# Patient Record
Sex: Female | Born: 1950 | Race: White | Hispanic: No | Marital: Single | Smoking: Never smoker
Health system: Southern US, Community
[De-identification: ages and names within clinical notes are randomized; demographics above are authoritative.]

## PROBLEM LIST (undated history)

## (undated) DIAGNOSIS — F32A Depression, unspecified: Secondary | ICD-10-CM

## (undated) DIAGNOSIS — G600 Hereditary motor and sensory neuropathy: Secondary | ICD-10-CM

## (undated) DIAGNOSIS — J302 Other seasonal allergic rhinitis: Secondary | ICD-10-CM

## (undated) DIAGNOSIS — E7800 Pure hypercholesterolemia, unspecified: Secondary | ICD-10-CM

## (undated) DIAGNOSIS — Z9889 Other specified postprocedural states: Secondary | ICD-10-CM

## (undated) DIAGNOSIS — F419 Anxiety disorder, unspecified: Secondary | ICD-10-CM

## (undated) DIAGNOSIS — M3500 Sicca syndrome, unspecified: Secondary | ICD-10-CM

## (undated) DIAGNOSIS — C801 Malignant (primary) neoplasm, unspecified: Secondary | ICD-10-CM

## (undated) DIAGNOSIS — Z9109 Other allergy status, other than to drugs and biological substances: Secondary | ICD-10-CM

## (undated) DIAGNOSIS — I10 Essential (primary) hypertension: Secondary | ICD-10-CM

## (undated) DIAGNOSIS — M329 Systemic lupus erythematosus, unspecified: Secondary | ICD-10-CM

## (undated) DIAGNOSIS — K222 Esophageal obstruction: Secondary | ICD-10-CM

## (undated) DIAGNOSIS — M1990 Unspecified osteoarthritis, unspecified site: Secondary | ICD-10-CM

## (undated) DIAGNOSIS — J45909 Unspecified asthma, uncomplicated: Secondary | ICD-10-CM

## (undated) DIAGNOSIS — R0600 Dyspnea, unspecified: Secondary | ICD-10-CM

## (undated) DIAGNOSIS — K219 Gastro-esophageal reflux disease without esophagitis: Secondary | ICD-10-CM

## (undated) DIAGNOSIS — IMO0002 Reserved for concepts with insufficient information to code with codable children: Secondary | ICD-10-CM

## (undated) DIAGNOSIS — R112 Nausea with vomiting, unspecified: Secondary | ICD-10-CM

## (undated) DIAGNOSIS — F329 Major depressive disorder, single episode, unspecified: Secondary | ICD-10-CM

## (undated) HISTORY — PX: CHOLECYSTECTOMY: SHX55

## (undated) HISTORY — PX: OTHER SURGICAL HISTORY: SHX169

## (undated) HISTORY — DX: Esophageal obstruction: K22.2

## (undated) HISTORY — DX: Other allergy status, other than to drugs and biological substances: Z91.09

## (undated) HISTORY — DX: Essential (primary) hypertension: I10

## (undated) HISTORY — PX: LUMBAR EPIDURAL INJECTION: SHX1980

## (undated) HISTORY — DX: Unspecified asthma, uncomplicated: J45.909

## (undated) HISTORY — PX: PARTIAL HYSTERECTOMY: SHX80

## (undated) HISTORY — DX: Gastro-esophageal reflux disease without esophagitis: K21.9

---

## 2017-05-26 ENCOUNTER — Encounter (INDEPENDENT_AMBULATORY_CARE_PROVIDER_SITE_OTHER): Payer: Self-pay | Admitting: Internal Medicine

## 2017-05-28 ENCOUNTER — Encounter (INDEPENDENT_AMBULATORY_CARE_PROVIDER_SITE_OTHER): Payer: Self-pay | Admitting: *Deleted

## 2017-05-28 ENCOUNTER — Encounter (INDEPENDENT_AMBULATORY_CARE_PROVIDER_SITE_OTHER): Payer: Self-pay

## 2017-05-28 ENCOUNTER — Encounter (INDEPENDENT_AMBULATORY_CARE_PROVIDER_SITE_OTHER): Payer: Self-pay | Admitting: Internal Medicine

## 2017-05-28 ENCOUNTER — Ambulatory Visit (INDEPENDENT_AMBULATORY_CARE_PROVIDER_SITE_OTHER): Payer: Medicare Other | Admitting: Internal Medicine

## 2017-05-28 ENCOUNTER — Other Ambulatory Visit (INDEPENDENT_AMBULATORY_CARE_PROVIDER_SITE_OTHER): Payer: Self-pay | Admitting: Internal Medicine

## 2017-05-28 DIAGNOSIS — R1310 Dysphagia, unspecified: Secondary | ICD-10-CM

## 2017-05-28 DIAGNOSIS — Z9109 Other allergy status, other than to drugs and biological substances: Secondary | ICD-10-CM | POA: Insufficient documentation

## 2017-05-28 DIAGNOSIS — K222 Esophageal obstruction: Secondary | ICD-10-CM | POA: Diagnosis not present

## 2017-05-28 DIAGNOSIS — R1319 Other dysphagia: Secondary | ICD-10-CM | POA: Insufficient documentation

## 2017-05-28 DIAGNOSIS — J45909 Unspecified asthma, uncomplicated: Secondary | ICD-10-CM

## 2017-05-28 DIAGNOSIS — K219 Gastro-esophageal reflux disease without esophagitis: Secondary | ICD-10-CM

## 2017-05-28 DIAGNOSIS — G600 Hereditary motor and sensory neuropathy: Secondary | ICD-10-CM | POA: Insufficient documentation

## 2017-05-28 DIAGNOSIS — I10 Essential (primary) hypertension: Secondary | ICD-10-CM

## 2017-05-28 HISTORY — DX: Gastro-esophageal reflux disease without esophagitis: K21.9

## 2017-05-28 HISTORY — DX: Essential (primary) hypertension: I10

## 2017-05-28 HISTORY — DX: Esophageal obstruction: K22.2

## 2017-05-28 HISTORY — DX: Unspecified asthma, uncomplicated: J45.909

## 2017-05-28 HISTORY — DX: Other allergy status, other than to drugs and biological substances: Z91.09

## 2017-05-28 NOTE — Progress Notes (Signed)
   Subjective:    Patient ID: Marie Calhoun, female    DOB: 1951-08-08, 66 y.o.   MRN: [id number]  HPI Referred by Ephriam Jenkins, NP for dysphagia. From records, l EGD in 2011 by Dr. Algis Greenhouse. She however shows me pictures of EGD/ED in 2014. Dysphagia since end of April. In May, foods would lodge and she would have to vomit bolus.  Appetite is okay. Weight loss of 20 pounds which was intentional  No abdominal pain. Has a A BM every other day.  No melena or BRRB. Last colonoscopy  2011 per patient.    04/03/2010 [id number] there is a distal esophageal stricture which has normal mucosa but biopsies are done.  Proximal stomach is normal.  IN the antrum there are multiple small gastric ulcers so biopsies are done for. H pylori.  Pyloric channel and duodenum, are normal. 54 Bougie dilator 5 times to fully dilate the stricture.  Review of Systems Past Medical History:  Diagnosis Date  . Asthma 05/28/2017  . Environmental allergies 05/28/2017  . Esophageal stricture 05/28/2017  . Essential hypertension, benign 05/28/2017  . GERD (gastroesophageal reflux disease) 05/28/2017    No past surgical history on file.  Allergies  Allergen Reactions  . Demerol [Meperidine]     nausea  . Sulfa Antibiotics      swell    No current outpatient prescriptions on file prior to visit.   No current facility-administered medications on file prior to visit.         Objective:   Physical Exam Blood pressure 140/70, pulse 76, temperature 98.6 F (37 C), height 5\' 8"  (1.727 m), weight 166 lb 3.2 oz (75.4 kg). Alert and oriented. Skin warm and dry. Oral mucosa is moist.   . Sclera anicteric, conjunctivae is pink. Thyroid not enlarged. No cervical lymphadenopathy. Lungs clear. Heart regular rate and rhythm.  Abdomen is soft. Bowel sounds are positive. No hepatomegaly. No abdominal masses felt. No tenderness.  No edema to lower extremities.  ( Both lower legs are in braces)        Assessment & Plan:    Dysphagia: esophageal stricture needs to be ruled out.  EGD/ED. The risks of bleeding, perforation and infection were reviewed with patient.

## 2017-05-28 NOTE — Patient Instructions (Signed)
EGD/ED. The risks of bleeding, perforation and infection were reviewed with patient.  

## 2017-05-30 ENCOUNTER — Encounter (INDEPENDENT_AMBULATORY_CARE_PROVIDER_SITE_OTHER): Payer: Self-pay

## 2017-08-15 ENCOUNTER — Encounter (INDEPENDENT_AMBULATORY_CARE_PROVIDER_SITE_OTHER): Payer: Self-pay | Admitting: Internal Medicine

## 2017-09-11 ENCOUNTER — Encounter (HOSPITAL_COMMUNITY): Payer: Self-pay | Admitting: *Deleted

## 2017-09-11 ENCOUNTER — Encounter (HOSPITAL_COMMUNITY)
Admission: RE | Disposition: A | Discharge status: Home / Self Care | Payer: Self-pay | Source: Ambulatory Visit | Attending: Internal Medicine

## 2017-09-11 ENCOUNTER — Ambulatory Visit (HOSPITAL_COMMUNITY)
Admission: RE | Admit: 2017-09-11 | Discharge: 2017-09-11 | Disposition: A | Discharge status: Home / Self Care | Payer: Medicare Other | Source: Ambulatory Visit | Attending: Internal Medicine | Admitting: Internal Medicine

## 2017-09-11 DIAGNOSIS — K449 Diaphragmatic hernia without obstruction or gangrene: Secondary | ICD-10-CM | POA: Diagnosis not present

## 2017-09-11 DIAGNOSIS — R1319 Other dysphagia: Secondary | ICD-10-CM

## 2017-09-11 DIAGNOSIS — R1314 Dysphagia, pharyngoesophageal phase: Secondary | ICD-10-CM | POA: Diagnosis not present

## 2017-09-11 DIAGNOSIS — E7800 Pure hypercholesterolemia, unspecified: Secondary | ICD-10-CM | POA: Diagnosis not present

## 2017-09-11 DIAGNOSIS — F329 Major depressive disorder, single episode, unspecified: Secondary | ICD-10-CM | POA: Diagnosis not present

## 2017-09-11 DIAGNOSIS — K219 Gastro-esophageal reflux disease without esophagitis: Secondary | ICD-10-CM | POA: Diagnosis not present

## 2017-09-11 DIAGNOSIS — F419 Anxiety disorder, unspecified: Secondary | ICD-10-CM | POA: Diagnosis not present

## 2017-09-11 DIAGNOSIS — Z79899 Other long term (current) drug therapy: Secondary | ICD-10-CM | POA: Insufficient documentation

## 2017-09-11 DIAGNOSIS — J45909 Unspecified asthma, uncomplicated: Secondary | ICD-10-CM | POA: Insufficient documentation

## 2017-09-11 DIAGNOSIS — K222 Esophageal obstruction: Secondary | ICD-10-CM | POA: Insufficient documentation

## 2017-09-11 DIAGNOSIS — I10 Essential (primary) hypertension: Secondary | ICD-10-CM | POA: Insufficient documentation

## 2017-09-11 DIAGNOSIS — R1310 Dysphagia, unspecified: Secondary | ICD-10-CM | POA: Insufficient documentation

## 2017-09-11 HISTORY — DX: Pure hypercholesterolemia, unspecified: E78.00

## 2017-09-11 HISTORY — DX: Other seasonal allergic rhinitis: J30.2

## 2017-09-11 HISTORY — DX: Depression, unspecified: F32.A

## 2017-09-11 HISTORY — DX: Major depressive disorder, single episode, unspecified: F32.9

## 2017-09-11 HISTORY — PX: ESOPHAGEAL DILATION: SHX303

## 2017-09-11 HISTORY — PX: ESOPHAGOGASTRODUODENOSCOPY: SHX5428

## 2017-09-11 HISTORY — DX: Anxiety disorder, unspecified: F41.9

## 2017-09-11 SURGERY — EGD (ESOPHAGOGASTRODUODENOSCOPY)
Anesthesia: Moderate Sedation

## 2017-09-11 MED ORDER — MEPERIDINE HCL 50 MG/ML IJ SOLN
INTRAMUSCULAR | Status: AC
  Filled 2017-09-11: qty 1

## 2017-09-11 MED ORDER — MIDAZOLAM HCL 5 MG/5ML IJ SOLN
INTRAMUSCULAR | Status: AC
  Filled 2017-09-11: qty 10

## 2017-09-11 MED ORDER — FENTANYL CITRATE (PF) 100 MCG/2ML IJ SOLN
INTRAMUSCULAR | Status: DC | PRN
  Administered 2017-09-11 (×2): 25 ug via INTRAVENOUS

## 2017-09-11 MED ORDER — SODIUM CHLORIDE 0.9 % IV SOLN
INTRAVENOUS | Status: DC
Start: 2017-09-11 — End: 2017-09-11
  Administered 2017-09-11: 13:00:00 via INTRAVENOUS

## 2017-09-11 MED ORDER — FENTANYL CITRATE (PF) 100 MCG/2ML IJ SOLN
INTRAMUSCULAR | Status: AC
  Filled 2017-09-11: qty 2

## 2017-09-11 MED ORDER — LIDOCAINE VISCOUS 2 % MT SOLN
OROMUCOSAL | Status: AC
  Filled 2017-09-11: qty 15

## 2017-09-11 MED ORDER — LIDOCAINE VISCOUS 2 % MT SOLN
OROMUCOSAL | Status: DC | PRN
Start: 2017-09-11 — End: 2017-09-11
  Administered 2017-09-11: 1 via OROMUCOSAL

## 2017-09-11 MED ORDER — MIDAZOLAM HCL 5 MG/5ML IJ SOLN
INTRAMUSCULAR | Status: DC | PRN
Start: 2017-09-11 — End: 2017-09-11
  Administered 2017-09-11 (×5): 2 mg via INTRAVENOUS

## 2017-09-11 NOTE — H&P (Signed)
Marie Calhoun is an 66 y.o. female.   Chief Complaint: Patient is here for EGD and ED. HPI: Patient is 66 year old Caucasian female was chronic GERD complicated by distal esophageal stricture which has been dilated times. She had dilation in May 2011 from July 2014 with Dr. Algis Greenhouse. She states she has done well until about 4 months ago. She says heartburn is well controlled with pantoprazole. She was switched to ranitidine but it did not help. She is not having any problems with pantoprazole. She denies nausea vomiting or melena.  Past Medical History:  Diagnosis Date  . Anxiety   . Asthma 05/28/2017  . Depression     05/28/2017  . Esophageal stricture 05/28/2017  . Essential hypertension, benign 05/28/2017  . GERD (gastroesophageal reflux disease) 05/28/2017  . Hypercholesteremia   . Seasonal allergies     Past Surgical History:  Procedure Laterality Date  . breast tumor\     left benign  . CHOLECYSTECTOMY    . knee surgeries     1980  . PARTIAL HYSTERECTOMY     1995    History reviewed. No pertinent family history. Social History:  reports that she has never smoked. She has never used smokeless tobacco. She reports that she does not drink alcohol or use drugs.  Allergies:  Allergies  Allergen Reactions  . Demerol [Meperidine] Nausea Only    nausea  . Sulfa Antibiotics Swelling    Medications Prior to Admission  Medication Sig Dispense Refill  . acetaminophen (TYLENOL) 500 MG tablet Take 1,000 mg by mouth daily.     . bisacodyl (DULCOLAX) 5 MG EC tablet Take 5 mg by mouth at bedtime.    . Calcium Carb-Cholecalciferol (CALCIUM 600 + D PO) Take 1 tablet by mouth 2 (two) times daily.    Marland Kitchen docusate sodium (COLACE) 100 MG capsule Take 100 mg by mouth at bedtime.     Marland Kitchen EPINEPHrine 0.3 mg/0.3 mL IJ SOAJ injection Inject into the muscle once.    . gabapentin (NEURONTIN) 600 MG tablet Take 600-900 mg by mouth 3 (three) times daily. Takes 1 (600 mg) tablet in the morning, 1.5 ( 900  mg)tablets at lunch and 1.5 (900 mg) tablet at bedtime    . losartan (COZAAR) 50 MG tablet Take 50 mg by mouth daily.    . montelukast (SINGULAIR) 10 MG tablet Take 10 mg by mouth at bedtime.    . Multiple Vitamin (MULTIVITAMIN WITH MINERALS) TABS tablet Take 1 tablet by mouth daily.    . pantoprazole (PROTONIX) 40 MG tablet Take 40 mg by mouth daily with lunch.    . sertraline (ZOLOFT) 50 MG tablet Take 50 mg by mouth daily.    . solifenacin (VESICARE) 5 MG tablet Take 5 mg by mouth at bedtime.     Marland Kitchen albuterol (PROVENTIL HFA;VENTOLIN HFA) 108 (90 Base) MCG/ACT inhaler Inhale 2 puffs into the lungs every 6 (six) hours as needed for wheezing or shortness of breath.     . fluocinonide cream (LIDEX) 3.41 % Apply 1 application topically 2 (two) times daily as needed (itching/rash).     Marland Kitchen guaiFENesin (MUCINEX) 600 MG 12 hr tablet Take 600 mg by mouth daily as needed for cough.     . pravastatin (PRAVACHOL) 10 MG tablet Take 10 mg by mouth daily.      No results found for this or any previous visit (from the past 48 hour(s)). No results found.  ROS  Blood pressure 131/83, pulse 75, temperature 98.7 F (37.1  C), temperature source Oral, resp. rate 20, height 5\' 8"  (1.727 m), weight 172 lb (78 kg), SpO2 96 %. Physical Exam  Constitutional: She appears well-developed and well-nourished.  HENT:  Mouth/Throat: Oropharynx is clear and moist.  Eyes: Conjunctivae are normal. No scleral icterus.  Neck: No thyromegaly present.  Cardiovascular: Normal rate, regular rhythm and normal heart sounds.   No murmur heard. Respiratory: Effort normal and breath sounds normal.  GI: Soft. Bowel sounds are normal.  Musculoskeletal: She exhibits no edema.  Lymphadenopathy:    She has no cervical adenopathy.  Neurological: She is alert.  Skin: Skin is warm and dry.     Assessment/Plan Solid food dysphagia in patient with history of esophageal stricture secondary to GERD. EGD with ED.  Hildred Laser,  MD 09/11/2017, 2:10 PM

## 2017-09-11 NOTE — Op Note (Signed)
[facility] Patient Name: Marie Calhoun Procedure Date: 09/11/2017 1:47 PM MRN: [id number] Date of Birth: [birth year] Attending MD: Hildred Laser , MD CSN: [id number] Age: 66 Admit Type: Outpatient Procedure:                Upper GI endoscopy Indications:              Esophageal dysphagia, Gastro-esophageal reflux                            disease Providers:                Hildred Laser, MD, Lurline Del, RN, Randa Spike,                            Technician Referring MD:             Earney Mallet, MD Medicines:                Lidocaine spray, Fentanyl 50 micrograms IV,                            Midazolam 10 mg IV Complications:            No immediate complications. Estimated Blood Loss:     Estimated blood loss was minimal. Procedure:                Pre-Anesthesia Assessment:                           - Prior to the procedure, a History and Physical                            was performed, and patient medications and                            allergies were reviewed. The patient's tolerance of                            previous anesthesia was also reviewed. The risks                            and benefits of the procedure and the sedation                            options and risks were discussed with the patient.                            All questions were answered, and informed consent                            was obtained. Prior Anticoagulants: The patient has                            taken no previous anticoagulant or antiplatelet                            agents. ASA Grade  Assessment: II - A patient with                            mild systemic disease. After reviewing the risks                            and benefits, the patient was deemed in                            satisfactory condition to undergo the procedure.                           After obtaining informed consent, the endoscope was                            passed under direct vision.  Throughout the                            procedure, the patient's blood pressure, pulse, and                            oxygen saturations were monitored continuously. The                            [id number]) scope was introduced through the                            mouth, and advanced to the second part of duodenum.                            The upper GI endoscopy was accomplished without                            difficulty. The patient tolerated the procedure                            well. Scope In: 2:23:12 PM Scope Out: 2:35:17 PM Total Procedure Duration: 0 hours 12 minutes 5 seconds  Findings:      The examined esophagus was normal.      A moderate Schatzki ring (acquired) was found at the gastroesophageal       junction located at 35 cm from incisors. A TTS dilator was passed       through the scope. Dilation with a 15-16.5-18 mm balloon dilator was       performed to 15 mm, 16.5 mm and 18 mm. The dilation site was examined       and showed mild improvement in luminal narrowing and no bleeding,       mucosal tear or perforation. This was biopsied with a cold forceps for       to disrupt the ring. Note tissue say for biopsy.      A 3 cm hiatal hernia was present.      The entire examined stomach was normal.      The duodenal bulb and second portion of the duodenum were normal. Impression:               -  Normal esophagus.                           - Moderate Schatzki ring. Dilated. Biopsied.                           - 3 cm hiatal hernia.                           - Normal stomach.                           - Normal duodenal bulb and second portion of the                            duodenum. Moderate Sedation:      Moderate (conscious) sedation was administered by the endoscopy nurse       and supervised by the endoscopist. The following parameters were       monitored: oxygen saturation, heart rate, blood pressure, CO2       capnography and response to care.  Total physician intraservice time was       21 minutes. Recommendation:           - Patient has a contact number available for                            emergencies. The signs and symptoms of potential                            delayed complications were discussed with the                            patient. Return to normal activities tomorrow.                            Written discharge instructions were provided to the                            patient.                           - Resume previous diet today.                           - Continue present medications.                           - No aspirin, ibuprofen, naproxen, or other                            non-steroidal anti-inflammatory drugs for 2 days.                           - Repeat upper endoscopy PRN.                           - Telephone GI clinic in 1 week. Procedure  Code(s):        --- Professional ---                           [phone], Esophagogastroduodenoscopy, flexible,                            transoral; with transendoscopic balloon dilation of                            esophagus (less than 30 mm diameter)                           43239, Esophagogastroduodenoscopy, flexible,                            transoral; with biopsy, single or multiple                           99152, Moderate sedation services provided by the                            same physician or other qualified health care                            professional performing the diagnostic or                            therapeutic service that the sedation supports,                            requiring the presence of an independent trained                            observer to assist in the monitoring of the                            patient's level of consciousness and physiological                            status; initial 15 minutes of intraservice time,                            patient age 54 years or older Diagnosis Code(s):         --- Professional ---                           K22.2, Esophageal obstruction                           K44.9, Diaphragmatic hernia without obstruction or                            gangrene                           R13.14, Dysphagia, pharyngoesophageal  phase                           K21.9, Gastro-esophageal reflux disease without                            esophagitis CPT copyright 2016 American Medical Association. All rights reserved. The codes documented in this report are preliminary and upon coder review may  be revised to meet current compliance requirements. Hildred Laser, MD Hildred Laser, MD 09/11/2017 2:48:19 PM This report has been signed electronically. Number of Addenda: 0

## 2017-09-11 NOTE — Discharge Instructions (Signed)
Resume usual medications and diet.. No driving for 24 hours. Please call office with progress report in one week.   Upper Endoscopy, Care After Refer to this sheet in the next few weeks. These instructions provide you with information about caring for yourself after your procedure. Your health care provider may also give you more specific instructions. Your treatment has been planned according to current medical practices, but problems sometimes occur. Call your health care provider if you have any problems or questions after your procedure. What can I expect after the procedure? After the procedure, it is common to have:  A sore throat.  Bloating.  Nausea.  Follow these instructions at home:  Follow instructions from your health care provider about what to eat or drink after your procedure.  Return to your normal activities as told by your health care provider. Ask your health care provider what activities are safe for you.  Take over-the-counter and prescription medicines only as told by your health care provider.  Do not drive for 24 hours if you received a sedative.  Keep all follow-up visits as told by your health care provider. This is important. Contact a health care provider if:  You have a sore throat that lasts longer than one day.  You have trouble swallowing. Get help right away if:  You have a fever.  You vomit blood or your vomit looks like coffee grounds.  You have bloody, black, or tarry stools.  You have a severe sore throat or you cannot swallow.  You have difficulty breathing.  You have severe pain in your chest or belly. This information is not intended to replace advice given to you by your health care provider. Make sure you discuss any questions you have with your health care provider. Document Released: 05/05/2012 Document Revised: 04/11/2016 Document Reviewed: 08/17/2015 Elsevier Interactive Patient Education  2017 [employer].

## 2017-09-15 ENCOUNTER — Encounter (HOSPITAL_COMMUNITY): Payer: Self-pay | Admitting: Internal Medicine

## 2017-09-22 ENCOUNTER — Telehealth (INDEPENDENT_AMBULATORY_CARE_PROVIDER_SITE_OTHER): Payer: Self-pay | Admitting: Internal Medicine

## 2017-09-22 NOTE — Telephone Encounter (Signed)
I will verbally give Dr.Rehman this progress report.

## 2017-09-22 NOTE — Telephone Encounter (Signed)
Patient called with a progress report and she is doing good.  [phone]

## 2018-06-15 ENCOUNTER — Encounter (INDEPENDENT_AMBULATORY_CARE_PROVIDER_SITE_OTHER): Payer: Self-pay | Admitting: *Deleted

## 2018-06-15 ENCOUNTER — Ambulatory Visit (INDEPENDENT_AMBULATORY_CARE_PROVIDER_SITE_OTHER): Payer: Medicare Other | Admitting: Internal Medicine

## 2018-06-15 ENCOUNTER — Encounter (INDEPENDENT_AMBULATORY_CARE_PROVIDER_SITE_OTHER): Payer: Self-pay | Admitting: Internal Medicine

## 2018-06-15 VITALS — BP 152/90 | HR 96 | Temp 98.5°F | Ht 68.0 in | Wt 141.6 lb

## 2018-06-15 DIAGNOSIS — R1310 Dysphagia, unspecified: Secondary | ICD-10-CM

## 2018-06-15 DIAGNOSIS — R1319 Other dysphagia: Secondary | ICD-10-CM

## 2018-06-15 NOTE — Patient Instructions (Signed)
The risks of bleeding, perforation and infection were reviewed with patient.  

## 2018-06-15 NOTE — Progress Notes (Signed)
Subjective:    Patient ID: Marie Calhoun, female    DOB: 10-19-1951, 67 y.o.   MRN: [id number]  HPI Here today for f/u.    EGD 05/28/2017 for dysphagia. In October of 2018 she underwent an EGD/ED:  Normal esphagus. Moderate Schatzki ring dilated. 3cm hiatal hernia. Normal stomach. Has undergone several EGD/ED in the past.  Protonix stopped by PCP Dr. Macarthur Critchley.  She has been using Tum and MOM for her GERD. She said it caused her Lupus.  Off Protoinx since February. She developed a rash. 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  She tells me today she is having swallowing. She says she has to be careful when she drinks also. Symptoms since May. Solid foods bother her. A couple of weeks ago, she choked at [employer].  She has had weight loss due to the dysphagia. She has been eating in small portions.  Wt last year in June was 166. Today her weight is 141.9   Review of Systems Past Medical History:  Diagnosis Date  . Anxiety   . Asthma 05/28/2017  . Depression   . Environmental allergies 05/28/2017  . Esophageal stricture 05/28/2017  . Essential hypertension, benign 05/28/2017  . GERD (gastroesophageal reflux disease) 05/28/2017  . Hypercholesteremia   . Seasonal allergies     Past Surgical History:  Procedure Laterality Date  . breast tumor\     left benign  . CHOLECYSTECTOMY    . ESOPHAGEAL DILATION N/A 09/11/2017   Procedure: ESOPHAGEAL DILATION;  Surgeon: Rogene Houston, MD;  Location: [facility];  Service: Endoscopy;  Laterality: N/A;  . ESOPHAGOGASTRODUODENOSCOPY N/A 09/11/2017   Procedure: ESOPHAGOGASTRODUODENOSCOPY (EGD);  Surgeon: Rogene Houston, MD;  Location: [facility];  Service: Endoscopy;  Laterality: N/A;  1:40 - Can NOT come earlier  . knee surgeries     1980  . PARTIAL HYSTERECTOMY     1995    Allergies  Allergen Reactions  . Demerol [Meperidine] Nausea Only    nausea  . Sulfa Antibiotics  Swelling    Current Outpatient Medications on File Prior to Visit  Medication Sig Dispense Refill  . amLODipine (NORVASC) 5 MG tablet Take 5 mg by mouth daily.    Marland Kitchen anti-nausea (EMETROL) solution Take 10 mLs by mouth as needed for nausea or vomiting.    . folic acid (FOLVITE) 1 MG tablet Take 1 mg by mouth daily.    . hydrOXYzine (ATARAX/VISTARIL) 25 MG tablet Take 25 mg by mouth 3 (three) times daily as needed.    . loratadine (CLARITIN) 10 MG tablet Take 10 mg by mouth daily.    . methotrexate 2.5 MG tablet Take 2.5 mg by mouth. 6 tabs every Wednesday    . nortriptyline (PAMELOR) 25 MG capsule Take 25 mg by mouth at bedtime.    . triamcinolone ointment (KENALOG) 0.1 % Apply 1 application topically 2 (two) times daily.    Marland Kitchen acetaminophen (TYLENOL) 500 MG tablet Take 1,000 mg by mouth daily.     Marland Kitchen albuterol (PROVENTIL HFA;VENTOLIN HFA) 108 (90 Base) MCG/ACT inhaler Inhale 2 puffs into the lungs every 6 (six) hours as needed for wheezing or shortness of breath.     . bisacodyl (DULCOLAX) 5 MG EC tablet Take 5 mg by mouth at bedtime.    . Calcium Carb-Cholecalciferol (CALCIUM 600 + D PO) Take 1 tablet by mouth 2 (two) times daily.    Marland Kitchen docusate sodium (  COLACE) 100 MG capsule Take 100 mg by mouth at bedtime.     Marland Kitchen EPINEPHrine 0.3 mg/0.3 mL IJ SOAJ injection Inject into the muscle once.    . fluocinonide cream (LIDEX) 8.63 % Apply 1 application topically 2 (two) times daily as needed (itching/rash).     . gabapentin (NEURONTIN) 600 MG tablet Take 600-900 mg by mouth 3 (three) times daily. Takes 1 (600 mg) tablet in the morning, 1.5 ( 900 mg)tablets at lunch and 1.5 (900 mg) tablet at bedtime    . guaiFENesin (MUCINEX) 600 MG 12 hr tablet Take 600 mg by mouth daily as needed for cough.     . losartan (COZAAR) 50 MG tablet Take 50 mg by mouth daily.    . montelukast (SINGULAIR) 10 MG tablet Take 10 mg by mouth at bedtime.    . Multiple Vitamin (MULTIVITAMIN WITH MINERALS) TABS tablet Take 1 tablet  by mouth daily.    . pantoprazole (PROTONIX) 40 MG tablet Take 40 mg by mouth daily with lunch.    . pravastatin (PRAVACHOL) 10 MG tablet Take 10 mg by mouth daily.    . sertraline (ZOLOFT) 50 MG tablet Take 50 mg by mouth daily.    . solifenacin (VESICARE) 5 MG tablet Take 5 mg by mouth at bedtime.      No current facility-administered medications on file prior to visit.         Objective:   Physical Exam Blood pressure (!) 152/90, pulse 96, temperature 98.5 F (36.9 C), height 5\' 8"  (1.727 m), weight 141 lb 9.6 oz (64.2 kg). Alert and oriented. Skin warm and dry. Oral mucosa is moist.   . Sclera anicteric, conjunctivae is pink. Thyroid not enlarged. No cervical lymphadenopathy. Lungs clear. Heart regular rate and rhythm.  Abdomen is soft. Bowel sounds are positive. No hepatomegaly. No abdominal masses felt. No tenderness.  No edema to lower extremities.          Assessment & Plan:  Dysphagia. EGD/ED. The risks of bleeding, perforation and infection were reviewed with patient.

## 2018-06-17 ENCOUNTER — Encounter (HOSPITAL_COMMUNITY)
Admission: RE | Disposition: A | Discharge status: Home / Self Care | Payer: Self-pay | Source: Ambulatory Visit | Attending: Internal Medicine

## 2018-06-17 ENCOUNTER — Ambulatory Visit (HOSPITAL_COMMUNITY)
Admission: RE | Admit: 2018-06-17 | Discharge: 2018-06-17 | Disposition: A | Discharge status: Home / Self Care | Payer: Medicare Other | Source: Ambulatory Visit | Attending: Internal Medicine | Admitting: Internal Medicine

## 2018-06-17 ENCOUNTER — Encounter (HOSPITAL_COMMUNITY): Payer: Self-pay | Admitting: *Deleted

## 2018-06-17 ENCOUNTER — Other Ambulatory Visit: Payer: Self-pay

## 2018-06-17 DIAGNOSIS — Z9049 Acquired absence of other specified parts of digestive tract: Secondary | ICD-10-CM | POA: Diagnosis not present

## 2018-06-17 DIAGNOSIS — Z885 Allergy status to narcotic agent status: Secondary | ICD-10-CM | POA: Diagnosis not present

## 2018-06-17 DIAGNOSIS — Z79899 Other long term (current) drug therapy: Secondary | ICD-10-CM | POA: Insufficient documentation

## 2018-06-17 DIAGNOSIS — Z882 Allergy status to sulfonamides status: Secondary | ICD-10-CM | POA: Insufficient documentation

## 2018-06-17 DIAGNOSIS — Z888 Allergy status to other drugs, medicaments and biological substances status: Secondary | ICD-10-CM | POA: Insufficient documentation

## 2018-06-17 DIAGNOSIS — F419 Anxiety disorder, unspecified: Secondary | ICD-10-CM | POA: Diagnosis not present

## 2018-06-17 DIAGNOSIS — R1310 Dysphagia, unspecified: Secondary | ICD-10-CM

## 2018-06-17 DIAGNOSIS — F329 Major depressive disorder, single episode, unspecified: Secondary | ICD-10-CM | POA: Diagnosis not present

## 2018-06-17 DIAGNOSIS — R1314 Dysphagia, pharyngoesophageal phase: Secondary | ICD-10-CM | POA: Diagnosis present

## 2018-06-17 DIAGNOSIS — J45909 Unspecified asthma, uncomplicated: Secondary | ICD-10-CM | POA: Diagnosis not present

## 2018-06-17 DIAGNOSIS — K210 Gastro-esophageal reflux disease with esophagitis: Secondary | ICD-10-CM | POA: Diagnosis not present

## 2018-06-17 DIAGNOSIS — K449 Diaphragmatic hernia without obstruction or gangrene: Secondary | ICD-10-CM | POA: Insufficient documentation

## 2018-06-17 DIAGNOSIS — K222 Esophageal obstruction: Secondary | ICD-10-CM | POA: Diagnosis not present

## 2018-06-17 DIAGNOSIS — I10 Essential (primary) hypertension: Secondary | ICD-10-CM | POA: Diagnosis not present

## 2018-06-17 DIAGNOSIS — R1319 Other dysphagia: Secondary | ICD-10-CM

## 2018-06-17 HISTORY — DX: Nausea with vomiting, unspecified: R11.2

## 2018-06-17 HISTORY — DX: Other specified postprocedural states: Z98.890

## 2018-06-17 HISTORY — PX: ESOPHAGOGASTRODUODENOSCOPY: SHX5428

## 2018-06-17 HISTORY — PX: ESOPHAGEAL DILATION: SHX303

## 2018-06-17 SURGERY — EGD (ESOPHAGOGASTRODUODENOSCOPY)
Anesthesia: Moderate Sedation

## 2018-06-17 MED ORDER — FAMOTIDINE 20 MG PO TABS: 20.0000 mg | ORAL_TABLET | Freq: Two times a day (BID) | ORAL | 5 refills | Status: DC

## 2018-06-17 MED ORDER — MIDAZOLAM HCL 5 MG/5ML IJ SOLN
INTRAMUSCULAR | Status: DC | PRN
  Administered 2018-06-17 (×3): 2 mg via INTRAVENOUS

## 2018-06-17 MED ORDER — SODIUM CHLORIDE 0.9 % IV SOLN
INTRAVENOUS | Status: DC
  Administered 2018-06-17: 1000 mL via INTRAVENOUS

## 2018-06-17 MED ORDER — LIDOCAINE VISCOUS HCL 2 % MT SOLN
OROMUCOSAL | Status: AC
  Filled 2018-06-17: qty 15

## 2018-06-17 MED ORDER — MIDAZOLAM HCL 5 MG/5ML IJ SOLN
INTRAMUSCULAR | Status: AC
  Filled 2018-06-17: qty 10

## 2018-06-17 MED ORDER — STERILE WATER FOR IRRIGATION IR SOLN
Status: DC | PRN
  Administered 2018-06-17: 10:00:00

## 2018-06-17 MED ORDER — FENTANYL CITRATE (PF) 100 MCG/2ML IJ SOLN
INTRAMUSCULAR | Status: DC | PRN
  Administered 2018-06-17 (×2): 25 ug via INTRAVENOUS

## 2018-06-17 MED ORDER — FENTANYL CITRATE (PF) 100 MCG/2ML IJ SOLN
INTRAMUSCULAR | Status: AC
  Filled 2018-06-17: qty 2

## 2018-06-17 NOTE — Discharge Instructions (Signed)
No aspirin or NSAIDs for 3 days. Resume usual medications as before. Famotidine 20 mg by mouth before breakfast and evening meal daily. No driving for 24 hours. Please call office with progress report in 1 week. Office visit in 3 months with Dr. Laural Golden   Upper Endoscopy, Care After Refer to this sheet in the next few weeks. These instructions provide you with information about caring for yourself after your procedure. Your health care provider may also give you more specific instructions. Your treatment has been planned according to current medical practices, but problems sometimes occur. Call your health care provider if you have any problems or questions after your procedure. What can I expect after the procedure? After the procedure, it is common to have:  A sore throat.  Bloating.  Nausea.  Follow these instructions at home:  Follow instructions from your health care provider about what to eat or drink after your procedure.  Return to your normal activities as told by your health care provider. Ask your health care provider what activities are safe for you.  Take over-the-counter and prescription medicines only as told by your health care provider.  Do not drive for 24 hours if you received a sedative.  Keep all follow-up visits as told by your health care provider. This is important. Contact a health care provider if:  You have a sore throat that lasts longer than one day.  You have trouble swallowing. Get help right away if:  You have a fever.  You vomit blood or your vomit looks like coffee grounds.  You have bloody, black, or tarry stools.  You have a severe sore throat or you cannot swallow.  You have difficulty breathing.  You have severe pain in your chest or belly. This information is not intended to replace advice given to you by your health care provider. Make sure you discuss any questions you have with your health care provider. Document Released:  05/05/2012 Document Revised: 04/11/2016 Document Reviewed: 08/17/2015 Elsevier Interactive Patient Education  [employer].

## 2018-06-17 NOTE — Op Note (Signed)
[facility] Patient Name: Marie Calhoun Procedure Date: 06/17/2018 11:06 AM MRN: [id number] Date of Birth: May 07, 1951 Attending MD: Hildred Laser , MD CSN: [id number] Age: 67 Admit Type: Outpatient Procedure:                Upper GI endoscopy Indications:              Esophageal dysphagia Providers:                Hildred Laser, MD, Hinton Rao, RN, Nelma Rothman,                            Technician Referring MD:             Earney Mallet, MD Medicines:                Lidocaine spray, Fentanyl 75 micrograms IV,                            Midazolam 8 mg IV Complications:            No immediate complications. Estimated Blood Loss:     Estimated blood loss was minimal. Procedure:                Pre-Anesthesia Assessment:                           - Prior to the procedure, a History and Physical                            was performed, and patient medications and                            allergies were reviewed. The patient's tolerance of                            previous anesthesia was also reviewed. The risks                            and benefits of the procedure and the sedation                            options and risks were discussed with the patient.                            All questions were answered, and informed consent                            was obtained. ASA Grade Assessment: III - A patient                            with severe systemic disease. After reviewing the                            risks and benefits, the patient was deemed in  satisfactory condition to undergo the procedure.                           After obtaining informed consent, the endoscope was                            passed under direct vision. Throughout the                            procedure, the patient's blood pressure, pulse, and                            oxygen saturations were monitored continuously. The                            GIF-H190  ([id number]) scope was introduced through the                            mouth, and advanced to the second part of duodenum.                            The upper GI endoscopy was accomplished without                            difficulty. The patient tolerated the procedure                            well. Scope In: 11:28:56 AM Scope Out: 11:37:35 AM Total Procedure Duration: 0 hours 8 minutes 39 seconds  Findings:      The proximal esophagus and mid esophagus were normal.      LA Grade B (one or more mucosal breaks greater than 5 mm, not extending       between the tops of two mucosal folds) esophagitis was found 32 to 33 cm       from the incisors.      One benign-appearing, intrinsic moderate (circumferential scarring or       stenosis; an endoscope may pass) stenosis was found 33 cm from the       incisors. This stenosis measured 1.2 cm (inner diameter) x less than one       cm (in length). The stenosis was traversed. A TTS dilator was passed       through the scope. Dilation with a 15-16.5-18 mm balloon dilator was       performed to 15 mm, 16.5 mm and 18 mm. The dilation site was examined       and showed mild mucosal disruption, moderate improvement in luminal       narrowing and no perforation.      A 3 cm hiatal hernia was present.      The entire examined stomach was normal.      The duodenal bulb and second portion of the duodenum were normal. Impression:               - Normal proximal esophagus and mid esophagus.                           -  LA Grade B reflux esophagitis.                           - Benign-appearing esophageal stenosis. Dilated.                           - 3 cm hiatal hernia.                           - Normal stomach.                           - Normal duodenal bulb and second portion of the                            duodenum.                           - No specimens collected. Moderate Sedation:      Moderate (conscious) sedation was administered by the  endoscopy nurse       and supervised by the endoscopist. The following parameters were       monitored: oxygen saturation, heart rate, blood pressure, CO2       capnography and response to care. Total physician intraservice time was       18 minutes. Recommendation:           - Patient has a contact number available for                            emergencies. The signs and symptoms of potential                            delayed complications were discussed with the                            patient. Return to normal activities tomorrow.                            Written discharge instructions were provided to the                            patient.                           - Resume previous diet today.                           - Continue present medications.                           - No aspirin, ibuprofen, naproxen, or other                            non-steroidal anti-inflammatory drugs for 3 days.                           - Famotidine 20 mg po bid.                           -  Telephone GI clinic in 1 week.                           - Return to GI clinic in 3 months. Procedure Code(s):        --- Professional ---                           [phone], Esophagogastroduodenoscopy, flexible,                            transoral; with transendoscopic balloon dilation of                            esophagus (less than 30 mm diameter)                           G0500, Moderate sedation services provided by the                            same physician or other qualified health care                            professional performing a gastrointestinal                            endoscopic service that sedation supports,                            requiring the presence of an independent trained                            observer to assist in the monitoring of the                            patient's level of consciousness and physiological                            status; initial 15  minutes of intra-service time;                            patient age 52 years or older (additional time may                            be reported with [phone], as appropriate) Diagnosis Code(s):        --- Professional ---                           K21.0, Gastro-esophageal reflux disease with                            esophagitis                           K22.2, Esophageal obstruction  K44.9, Diaphragmatic hernia without obstruction or                            gangrene                           R13.14, Dysphagia, pharyngoesophageal phase CPT copyright 2017 [employer]. All rights reserved. The codes documented in this report are preliminary and upon coder review may  be revised to meet current compliance requirements. Hildred Laser, MD Hildred Laser, MD 06/17/2018 11:49:54 AM This report has been signed electronically. Number of Addenda: 0

## 2018-06-17 NOTE — H&P (Signed)
Marie Calhoun is an 67 y.o. female.   Chief Complaint: Patient is here for EGD and ED. HPI: Patient 67 year old Caucasian female who has chronic GERD who is pantoprazole was stopped in December 2018 because of rash.  She has a history of distal esophageal ring which was dilated in October 2018.  She reports no difficulty swallowing until about 2 months ago when she had episode of food impaction while she was eating a hamburger.  Since then she has had few more episodes.  She has heartburn 3-4 times a week depending on what food she eats.  She points to upper sternal area sort of bolus obstruction.  She states this year she has lost close to 40 pounds.  She is not trying to do so.  No history of abdominal pain nausea vomiting or melena. She does not take aspirin or other NSAIDs.  Past Medical History:  Diagnosis Date  . Anxiety   . Asthma 05/28/2017  . Depression   . Environmental allergies 05/28/2017  . Esophageal stricture 05/28/2017  . Essential hypertension, benign 05/28/2017  . GERD (gastroesophageal reflux disease) 05/28/2017  . Hypercholesteremia   . PONV (postoperative nausea and vomiting)   . Seasonal allergies     Past Surgical History:  Procedure Laterality Date  . breast tumor\     left benign  . CHOLECYSTECTOMY    . ESOPHAGEAL DILATION N/A 09/11/2017   Procedure: ESOPHAGEAL DILATION;  Surgeon: Rogene Houston, MD;  Location: [facility];  Service: Endoscopy;  Laterality: N/A;  . ESOPHAGOGASTRODUODENOSCOPY N/A 09/11/2017   Procedure: ESOPHAGOGASTRODUODENOSCOPY (EGD);  Surgeon: Rogene Houston, MD;  Location: [facility];  Service: Endoscopy;  Laterality: N/A;  1:40 - Can NOT come earlier  . knee surgeries     1980  . PARTIAL HYSTERECTOMY     1995    History reviewed. No pertinent family history. Social History:  reports that she has never smoked. She has never used smokeless tobacco. She reports that she does not drink alcohol or use drugs.  Allergies:  Allergies   Allergen Reactions  . Demerol [Meperidine] Nausea Only    nausea  . Pantoprazole     Subacute Cutaneous Lupus of the Skin  . Plaquenil [Hydroxychloroquine]     Caused another Rash on top of Lupus Rash  . Sulfa Antibiotics Swelling    *Childhood*    Medications Prior to Admission  Medication Sig Dispense Refill  . acetaminophen (TYLENOL) 500 MG tablet Take 1,000 mg by mouth every 8 (eight) hours as needed for mild pain or moderate pain.     Marland Kitchen amLODipine (NORVASC) 5 MG tablet Take 5 mg by mouth daily.    . folic acid (FOLVITE) 1 MG tablet Take 1 mg by mouth daily.    . hydrOXYzine (ATARAX/VISTARIL) 25 MG tablet Take 25 mg by mouth 3 (three) times daily as needed.    . methotrexate 2.5 MG tablet Take 15 mg by mouth once a week. 6 tabs every Wednesday     . nortriptyline (PAMELOR) 25 MG capsule Take 50 mg by mouth at bedtime.     . triamcinolone ointment (KENALOG) 0.1 % Apply 1 application topically at bedtime.     Marland Kitchen albuterol (PROVENTIL HFA;VENTOLIN HFA) 108 (90 Base) MCG/ACT inhaler Inhale 2 puffs into the lungs every 6 (six) hours as needed for wheezing or shortness of breath.     . Calcium Carb-Cholecalciferol (CALCIUM 600 + D PO) Take 1 tablet by mouth 2 (two) times daily.    Marland Kitchen  docusate sodium (COLACE) 100 MG capsule Take 100 mg by mouth daily as needed for mild constipation.     Marland Kitchen EPINEPHrine 0.3 mg/0.3 mL IJ SOAJ injection Inject into the muscle once.    . loratadine (CLARITIN) 10 MG tablet Take 10 mg by mouth daily.    . Multiple Vitamin (MULTIVITAMIN WITH MINERALS) TABS tablet Take 1 tablet by mouth daily.      No results found for this or any previous visit (from the past 48 hour(s)). No results found.  ROS  Blood pressure 120/90, pulse 91, temperature 98.3 F (36.8 C), temperature source Oral, resp. rate 17, height 5\' 8"  (1.727 m), weight 141 lb (64 kg), SpO2 100 %. Physical Exam  Constitutional: She appears well-developed and well-nourished.  HENT:  Mouth/Throat:  Oropharynx is clear and moist.  Eyes: Conjunctivae are normal. No scleral icterus.  Neck: No thyromegaly present.  Cardiovascular: Normal rate, regular rhythm and normal heart sounds.  No murmur heard. Respiratory: Effort normal and breath sounds normal.  GI: Soft. She exhibits no distension and no mass. There is no tenderness.  Musculoskeletal: She exhibits no edema.  Lymphadenopathy:    She has no cervical adenopathy.  Neurological: She is alert.  Skin: Skin is warm and dry.     Assessment/Plan Solid food dysphagia. History of Schatzki's ring. EGD with ED.  Hildred Laser, MD 06/17/2018, 11:12 AM

## 2018-06-22 ENCOUNTER — Encounter (HOSPITAL_COMMUNITY): Payer: Self-pay | Admitting: Internal Medicine

## 2018-06-25 ENCOUNTER — Telehealth (INDEPENDENT_AMBULATORY_CARE_PROVIDER_SITE_OTHER): Payer: Self-pay | Admitting: *Deleted

## 2018-06-25 NOTE — Telephone Encounter (Signed)
Glad to know that patient swallowing has improved post dilation.

## 2018-06-25 NOTE — Telephone Encounter (Signed)
Reflux medicine is helping and her swallowing is a lot better

## 2018-07-15 ENCOUNTER — Telehealth (INDEPENDENT_AMBULATORY_CARE_PROVIDER_SITE_OTHER): Payer: Self-pay | Admitting: *Deleted

## 2018-07-15 NOTE — Telephone Encounter (Signed)
noted 

## 2018-07-15 NOTE — Telephone Encounter (Signed)
Patient presented to the office today for a weight check.She weighed 141 lbs 7oz. On July 29/2019 she weighed 141 lbs 9.6 oz.  Patient was made aware that  Dr.Rehman and Deberah Castle  NP would be made aware and she would be made aware of any recommendations.

## 2018-07-16 NOTE — Telephone Encounter (Signed)
Weight stable. No action needed.

## 2018-10-06 ENCOUNTER — Ambulatory Visit (INDEPENDENT_AMBULATORY_CARE_PROVIDER_SITE_OTHER): Payer: Medicare Other | Admitting: Internal Medicine

## 2018-10-06 ENCOUNTER — Encounter (INDEPENDENT_AMBULATORY_CARE_PROVIDER_SITE_OTHER): Payer: Self-pay | Admitting: Internal Medicine

## 2018-10-06 VITALS — BP 138/102 | HR 88 | Temp 98.6°F | Resp 18 | Ht 68.0 in | Wt 141.3 lb

## 2018-10-06 DIAGNOSIS — K2100 Gastro-esophageal reflux disease with esophagitis, without bleeding: Secondary | ICD-10-CM

## 2018-10-06 DIAGNOSIS — K5900 Constipation, unspecified: Secondary | ICD-10-CM

## 2018-10-06 DIAGNOSIS — K222 Esophageal obstruction: Secondary | ICD-10-CM

## 2018-10-06 MED ORDER — DOCUSATE SODIUM 100 MG PO CAPS: 200.0000 mg | ORAL_CAPSULE | Freq: Every day | ORAL | Status: DC

## 2018-10-06 MED ORDER — FAMOTIDINE 20 MG PO TABS: 20.0000 mg | ORAL_TABLET | Freq: Two times a day (BID) | ORAL | 3 refills | Status: DC

## 2018-10-06 NOTE — Patient Instructions (Addendum)
Call if famotidine/Pepcid stops working. High-fiber diet as discussed. Call with progress report in 6 to 8 weeks regarding constipation.

## 2018-10-07 NOTE — Progress Notes (Signed)
Presenting complaint;  Follow-up for GERD and dysphagia. Patient complains of constipation.  Database and subjective:  Patient is 67 year old Caucasian female who has chronic GERD. She has a history of Schatzki's ring.  Her esophagus was initially dilated in 09/11/2017.  She was on pantoprazole which was discontinued as there was concern that it was making her lupus worse.  She presented again with solid food dysphagia and underwent EGD on 06/17/2018 revealing erosive esophagitis stricture at GE junction and small sliding hiatal hernia.  Stricture was dilated to 18 mm with balloon.  She was begun on famotidine.  She did not return for scheduled visit.  She feels much better.  She has not had any difficulty swallowing liquids or solids.  She has heartburn occasionally with certain foods.  She states she is a slow eater.  She is a last 1 to leave the table.  She says she was begun on methotrexate in April this year for SLE.  She has noted issues with constipation since then. She is on nortriptyline for Charcot-Marie-Tooth disease. She has been taking Colace 100 mg on as-needed basis and it seems to help.  She wants to make sure that taking this medication would not interfere with other medications or detrimental to her health. Her appetite is good and her weight has been stable.   Current Medications: Outpatient Encounter Medications as of 10/06/2018  Medication Sig  . acetaminophen (TYLENOL) 500 MG tablet Take 1,000 mg by mouth every 8 (eight) hours as needed for mild pain or moderate pain.   Marland Kitchen albuterol (PROVENTIL HFA;VENTOLIN HFA) 108 (90 Base) MCG/ACT inhaler Inhale 2 puffs into the lungs every 6 (six) hours as needed for wheezing or shortness of breath.   Marland Kitchen amLODipine (NORVASC) 5 MG tablet Take 5 mg by mouth daily.  Marland Kitchen EPINEPHrine 0.3 mg/0.3 mL IJ SOAJ injection Inject into the muscle once.  . famotidine (PEPCID) 20 MG tablet Take 1 tablet (20 mg total) by mouth 2 (two) times daily.  . folic  acid (FOLVITE) 1 MG tablet Take 1 mg by mouth daily.  . hydrOXYzine (ATARAX/VISTARIL) 25 MG tablet Take 25 mg by mouth 3 (three) times daily as needed.  . loratadine (CLARITIN) 10 MG tablet Take 10 mg by mouth daily.  . methotrexate 2.5 MG tablet Take 15 mg by mouth once a week. 6 tabs every Wednesday   . nortriptyline (PAMELOR) 25 MG capsule Take 50 mg by mouth at bedtime.   . triamcinolone ointment (KENALOG) 0.1 % Apply 1 application topically at bedtime.   . [DISCONTINUED] famotidine (PEPCID) 20 MG tablet Take 1 tablet (20 mg total) by mouth 2 (two) times daily.  . Calcium Carb-Cholecalciferol (CALCIUM 600 + D PO) Take 1 tablet by mouth 2 (two) times daily.  Marland Kitchen docusate sodium (COLACE) 100 MG capsule Take 2 capsules (200 mg total) by mouth at bedtime.  . Multiple Vitamin (MULTIVITAMIN WITH MINERALS) TABS tablet Take 1 tablet by mouth daily.  . [DISCONTINUED] docusate sodium (COLACE) 100 MG capsule Take 100 mg by mouth daily as needed for mild constipation.    No facility-administered encounter medications on file as of 10/06/2018.      Objective: Blood pressure (!) 138/102, pulse 88, temperature 98.6 F (37 C), temperature source Oral, resp. rate 18, height 5\' 8"  (1.727 m), weight 141 lb 4.8 oz (64.1 kg). Patient is alert and in no acute distress. Conjunctiva is pink. Sclera is nonicteric Oropharyngeal mucosa is normal. No neck masses or thyromegaly noted. Cardiac exam with regular  rhythm normal S1 and S2. No murmur or gallop noted. Lungs are clear to auscultation. Abdomen is symmetrical soft and nontender with organomegaly or masses. She has bilateral ankle braces.  Lab data:  Lab data from 07/02/2018 WBC 4.8, H&H 14.1 and 41.0 and platelet count 180K. Bilirubin 0.9, AP 147, AST 36, ALT 43 and albumin 4.2. Serum calcium 9.6. BUN 12 and creatinine 0.9.   Assessment:  #1.  GERD.  EGD of June 17, 2018 revealed erosive reflux esophagitis.  Typical symptoms are well controlled  with dietary measures and famotidine.  #2.  Esophageal stricture.  GERD also complicated by esophageal stricture which was dilated on EGD of 06/17/2018.  She is not having dysphagia anymore.  #3.  Constipation possibly triggered by her medications.  She can take Colace on schedule rather than as needed.  She should also increase intake of fiber rich foods.   Plan:  New prescription for famotidine 20 mg p.o. twice daily sent to her pharmacy for 90 days with 3 refills. Patient advised to increase consumption of fiber rich foods such as apples and other fruits and eat cereals rich in fiber. She can take Colace 200 mg p.o. Nightly. She will call with progress report in 6 to 8 weeks regarding constipation. Office visit in 1 year.

## 2019-10-05 ENCOUNTER — Encounter (INDEPENDENT_AMBULATORY_CARE_PROVIDER_SITE_OTHER): Payer: Self-pay | Admitting: Internal Medicine

## 2019-10-05 ENCOUNTER — Encounter (INDEPENDENT_AMBULATORY_CARE_PROVIDER_SITE_OTHER): Payer: Self-pay | Admitting: *Deleted

## 2019-10-05 ENCOUNTER — Ambulatory Visit (INDEPENDENT_AMBULATORY_CARE_PROVIDER_SITE_OTHER): Payer: Medicare Other | Admitting: Internal Medicine

## 2019-10-05 ENCOUNTER — Other Ambulatory Visit: Payer: Self-pay

## 2019-10-05 VITALS — BP 128/86 | HR 105 | Temp 98.1°F | Ht 68.0 in | Wt 153.5 lb

## 2019-10-05 DIAGNOSIS — R1310 Dysphagia, unspecified: Secondary | ICD-10-CM | POA: Diagnosis not present

## 2019-10-05 DIAGNOSIS — K222 Esophageal obstruction: Secondary | ICD-10-CM | POA: Diagnosis not present

## 2019-10-05 DIAGNOSIS — R1319 Other dysphagia: Secondary | ICD-10-CM

## 2019-10-05 NOTE — Patient Instructions (Signed)
Physician will call with results of barium study when completed. 

## 2019-10-05 NOTE — Progress Notes (Signed)
Presenting complaint;  Follow-up for GERD and esophageal stricture.  Database and subjective:  Patient is 68 year old Caucasian female who has chronic GERD complicated by distal esophageal stricture which was dilated in October 2018 and more recently in July 2019. Patient was last seen 1 year ago and was doing well. She now presents with complaint of solid food dysphagia.  Since her last visit she has had 2 episodes of dysphagia.  First episode occurred 2 months ago when she was eating chicken cheese steak at a restaurant.  She tried to push it down with water but water came back.  She was able to get relieved spontaneously.  Second episode occurred 1 month ago.  Once again she was able to get relief.  She states heartburn is well controlled.  She rarely has with certain foods.  She says she is a slow eater and chooses food thoroughly.  She has not had any episode of dysphagia at home.  Her appetite is good and she has gained 12 pounds since her last visit.  She has chronic constipation.  She says her bowels are moving fine as long as she takes polyethylene glycol. She is on methotrexate for rash felt to be due to lupus.  There is a concern of this was triggered by pantoprazole.   Current Medications: Outpatient Encounter Medications as of 10/05/2019  Medication Sig  . acetaminophen (TYLENOL) 500 MG tablet Take 1,000 mg by mouth every 8 (eight) hours as needed for mild pain or moderate pain.   Marland Kitchen albuterol (PROVENTIL HFA;VENTOLIN HFA) 108 (90 Base) MCG/ACT inhaler Inhale 2 puffs into the lungs every 6 (six) hours as needed for wheezing or shortness of breath.   Marland Kitchen amLODipine (NORVASC) 5 MG tablet Take 5 mg by mouth daily.  . Calcium Carb-Cholecalciferol (CALCIUM 600 + D PO) Take 1 tablet by mouth 2 (two) times daily.  . Cyanocobalamin (VITAMIN B 12 PO) Take 1,000 mg elemental calcium/kg/hr by mouth daily.  Marland Kitchen docusate sodium (COLACE) 100 MG capsule Take 2 capsules (200 mg total) by mouth at bedtime.   Marland Kitchen EPINEPHrine 0.3 mg/0.3 mL IJ SOAJ injection Inject into the muscle once.  . famotidine (PEPCID) 20 MG tablet Take 1 tablet (20 mg total) by mouth 2 (two) times daily.  . folic acid (FOLVITE) 1 MG tablet Take 1 mg by mouth daily.  . hydrOXYzine (ATARAX/VISTARIL) 25 MG tablet Take 25 mg by mouth 3 (three) times daily as needed.  . methotrexate 2.5 MG tablet Take 15 mg by mouth once a week. Patient is taking 2.5 mg -4 tablets every Wednesday  . Multiple Vitamin (MULTIVITAMIN WITH MINERALS) TABS tablet Take 1 tablet by mouth daily.  . nortriptyline (PAMELOR) 25 MG capsule Take 50 mg by mouth at bedtime.   . Polyethylene Glycol 3350 (MIRALAX PO) Take by mouth. 3 tables spoons in the morning.  . triamcinolone ointment (KENALOG) 0.1 % Apply 1 application topically at bedtime.   . [DISCONTINUED] loratadine (CLARITIN) 10 MG tablet Take 10 mg by mouth daily.   No facility-administered encounter medications on file as of 10/05/2019.     Objective: Blood pressure 128/86, pulse (!) 105, temperature 98.1 F (36.7 C), temperature source Oral, height 5\' 8"  (1.727 m), weight 153 lb 8 oz (69.6 kg). Patient is alert and in no acute distress. She is wearing a mask. Conjunctiva is pink. Sclera is nonicteric Oropharyngeal mucosa is normal. No neck masses or thyromegaly noted. Cardiac exam with regular rhythm normal S1 and S2. No murmur or gallop  noted. Lungs are clear to auscultation. Abdomen is symmetrical soft and nontender with organomegaly or masses. No LE edema or clubbing noted.   Assessment:  #1.  Esophageal dysphagia.  Her esophagus was last dilated in July 2019.  She has distal esophageal stricture.  It is interesting to note that she had 2 episodes in the last 2 months and both of these occurred while she was eating at a restaurant.  Given history of connective tissue disorder she could also have esophageal dysmotility.  Will evaluate her symptom with barium pill study before considering  another EGD.  #2.  Chronic GERD.  Patient is presently on H2 B and antireflux measures.  This therapy may not be sufficient.   Plan:  Barium pill esophagogram. Further recommendations to follow. Office visit on as-needed basis.

## 2019-10-12 ENCOUNTER — Ambulatory Visit (INDEPENDENT_AMBULATORY_CARE_PROVIDER_SITE_OTHER): Payer: Medicare Other | Admitting: Internal Medicine

## 2019-10-13 ENCOUNTER — Ambulatory Visit (HOSPITAL_COMMUNITY)
Admission: RE | Admit: 2019-10-13 | Discharge: 2019-10-13 | Disposition: A | Discharge status: Home / Self Care | Payer: Medicare Other | Source: Ambulatory Visit | Attending: Internal Medicine | Admitting: Internal Medicine

## 2019-10-13 ENCOUNTER — Other Ambulatory Visit: Payer: Self-pay

## 2019-10-13 DIAGNOSIS — R1310 Dysphagia, unspecified: Secondary | ICD-10-CM | POA: Insufficient documentation

## 2019-10-13 DIAGNOSIS — K222 Esophageal obstruction: Secondary | ICD-10-CM | POA: Insufficient documentation

## 2019-10-19 ENCOUNTER — Other Ambulatory Visit (INDEPENDENT_AMBULATORY_CARE_PROVIDER_SITE_OTHER): Payer: Self-pay | Admitting: Internal Medicine

## 2019-10-26 ENCOUNTER — Encounter (INDEPENDENT_AMBULATORY_CARE_PROVIDER_SITE_OTHER): Payer: Self-pay

## 2020-01-24 ENCOUNTER — Ambulatory Visit (INDEPENDENT_AMBULATORY_CARE_PROVIDER_SITE_OTHER): Payer: Medicare Other | Admitting: Internal Medicine

## 2020-01-24 ENCOUNTER — Other Ambulatory Visit: Payer: Self-pay

## 2020-01-24 ENCOUNTER — Encounter (INDEPENDENT_AMBULATORY_CARE_PROVIDER_SITE_OTHER): Payer: Self-pay | Admitting: Internal Medicine

## 2020-01-24 VITALS — BP 129/85 | HR 103 | Temp 97.5°F | Ht 68.0 in | Wt 155.3 lb

## 2020-01-24 DIAGNOSIS — K2100 Gastro-esophageal reflux disease with esophagitis, without bleeding: Secondary | ICD-10-CM

## 2020-01-24 DIAGNOSIS — R079 Chest pain, unspecified: Secondary | ICD-10-CM | POA: Insufficient documentation

## 2020-01-24 DIAGNOSIS — R0789 Other chest pain: Secondary | ICD-10-CM | POA: Diagnosis not present

## 2020-01-24 NOTE — Progress Notes (Signed)
Presenting complaint;  Recent bout of chest pain.  Database and subjective:  Patient is 69 year old Caucasian female who has history of erosive reflux esophagitis and distal esophageal stricture which was last dilated in July 2019.  She was last seen in November 2020 when she was experiencing dysphagia.  Barium study was obtained on 10/13/2019 revealing age-related esophageal dysmotility Schatzki's ring at GE junction as well as esophageal valve and a small sliding hiatal hernia.  Barium pill passed from oral cavity into the stomach without any delay.  We therefore decided to monitor her symptoms.  Patient states she was doing well until 3:00 AM on 01/16/2020 when she woke up with severe retrosternal pain.  When she went to bed she was feeling fine.  She does not recall that she had diaphoreses.  She had some chills and she might have been mildly short of breath.  She called 911.  She was evaluated at home by EMS.  Her EKG was normal.  Was felt her pain was noncardiac.  He therefore did not go to emergency room.  Since then patient has not experienced any sharp pain but at times she has mild discomfort.  She is not experiencing heartburn or dysphagia.  She says she is able to swallow larger pills without any difficulty. Patient reports she had second dose of maternal vaccine on 01/12/2020.  She had nausea and vomiting all day on 01/13/2020 and for the next 2 days she noted chest pressure or tightness. She did not experience hematemesis melena abdominal pain or rectal bleeding. She remains on famotidine 20 mg p.o. twice daily.  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 She watches her diet.  She eats her supper at least 4 hours before she goes to bed.  She has had and of the bed elevated.  Current Medications: Outpatient Encounter Medications as of 01/24/2020  Medication Sig  . acetaminophen (TYLENOL) 500 MG tablet Take 1,000 mg by mouth every 8 (eight) hours as needed for mild pain or  moderate pain.   Marland Kitchen albuterol (PROVENTIL HFA;VENTOLIN HFA) 108 (90 Base) MCG/ACT inhaler Inhale 2 puffs into the lungs every 6 (six) hours as needed for wheezing or shortness of breath.   Marland Kitchen amLODipine (NORVASC) 5 MG tablet Take 5 mg by mouth daily.  . Calcium Carb-Cholecalciferol (CALCIUM 600 + D PO) Take 1 tablet by mouth 2 (two) times daily.  . Cyanocobalamin (VITAMIN B 12 PO) Take 1,000 mg elemental calcium/kg/hr by mouth daily.  Marland Kitchen docusate sodium (COLACE) 100 MG capsule Take 2 capsules (200 mg total) by mouth at bedtime.  Marland Kitchen EPINEPHrine 0.3 mg/0.3 mL IJ SOAJ injection Inject into the muscle once.  . famotidine (PEPCID) 20 MG tablet TAKE 1 TABLET TWICE DAILY  . folic acid (FOLVITE) 1 MG tablet Take 1 mg by mouth daily.  . hydrOXYzine (ATARAX/VISTARIL) 25 MG tablet Take 25 mg by mouth 3 (three) times daily as needed.  . methotrexate 2.5 MG tablet Take 15 mg by mouth once a week. Patient is taking 2.5 mg -4 tablets every Wednesday  . MINERAL OIL PO Take by mouth. Dripper to ears as needed.  . Multiple Vitamin (MULTIVITAMIN WITH MINERALS) TABS tablet Take 1 tablet by mouth daily.  . nortriptyline (PAMELOR) 25 MG capsule Take 50 mg by mouth at bedtime.   Marland Kitchen OVER THE COUNTER MEDICATION Nasal Salt Lavage - Patient will take 2 daily on as needed basis.  . Polyethylene Glycol 3350 (MIRALAX PO) Take by mouth. 3 tables spoons in  the morning.  . silver sulfADIAZINE (SILVADENE) 1 % cream Apply 1 application topically daily. Applies daily to foot sores  . triamcinolone ointment (KENALOG) 0.1 % Apply 1 application topically at bedtime.    No facility-administered encounter medications on file as of 01/24/2020.     Objective: Blood pressure 129/85, pulse (!) 103, temperature (!) 97.5 F (36.4 C), temperature source Temporal, height 5\' 8"  (1.727 m), weight 155 lb 4.8 oz (70.4 kg). Patient is alert and in no acute distress. She is wearing a facial mask. Conjunctiva is pink. Sclera is  nonicteric Oropharyngeal mucosa is normal. No neck masses or thyromegaly noted. Cardiac exam with regular rhythm normal S1 and S2. No murmur or gallop noted. Lungs are clear to auscultation. Abdomen is symmetrical soft and nontender with no organomegaly or masses. No LE edema or clubbing noted.  Assessment:  #1.  Recent episode of acute chest pain appears to be due to esophagitis triggered by multiple episodes of vomiting most likely due to Covid vaccine.  It was a second shot.  She could also have had food poisoning.  She had normal ECG by EMS while she was having acute pain and therefore felt not to have a cardiac pain.  She now has minimal residual discomfort.  Unfortunately she cannot be treated with PPIs even for a short duration.  #2.  Chronic GERD with distal esophageal ring/stricture.  She is not having typical symptoms or dysphagia.  Plan:  Increase famotidine to 40 mg p.o. twice daily for 4 weeks and then she will go back to taking 20 mg twice daily. Patient will call if she experience another episode of chest pain or dysphagia in which case we will proceed with diagnostic esophagogastroduodenoscopy. Office visit in 6 months.

## 2020-01-24 NOTE — Patient Instructions (Signed)
Take famotidine 40 mg twice daily for 4 weeks and then back to 20 mg twice daily as before. Please call office if you experience another episode of chest pain or swallowing difficulty.

## 2020-05-10 DIAGNOSIS — M720 Palmar fascial fibromatosis [Dupuytren]: Secondary | ICD-10-CM | POA: Insufficient documentation

## 2020-07-31 ENCOUNTER — Other Ambulatory Visit: Payer: Self-pay

## 2020-07-31 ENCOUNTER — Encounter (INDEPENDENT_AMBULATORY_CARE_PROVIDER_SITE_OTHER): Payer: Self-pay | Admitting: Internal Medicine

## 2020-07-31 ENCOUNTER — Ambulatory Visit (INDEPENDENT_AMBULATORY_CARE_PROVIDER_SITE_OTHER): Payer: Medicare Other | Admitting: Internal Medicine

## 2020-07-31 VITALS — BP 128/86 | HR 94 | Temp 99.4°F | Ht 68.0 in | Wt 160.0 lb

## 2020-07-31 DIAGNOSIS — K5900 Constipation, unspecified: Secondary | ICD-10-CM | POA: Diagnosis not present

## 2020-07-31 DIAGNOSIS — K5909 Other constipation: Secondary | ICD-10-CM | POA: Insufficient documentation

## 2020-07-31 DIAGNOSIS — K219 Gastro-esophageal reflux disease without esophagitis: Secondary | ICD-10-CM | POA: Diagnosis not present

## 2020-07-31 MED ORDER — LINACLOTIDE 72 MCG PO CAPS: 72.0000 ug | ORAL_CAPSULE | Freq: Every day | ORAL | 2 refills | Status: DC

## 2020-07-31 MED ORDER — FAMOTIDINE 20 MG PO TABS: 20.0000 mg | ORAL_TABLET | Freq: Two times a day (BID) | ORAL | 3 refills | Status: DC

## 2020-07-31 NOTE — Progress Notes (Addendum)
Presenting complaint;  Follow for chronic GERD. Patient complains of constipation.  Database and subjective:  Patient is 69 year old Caucasian female who is here for scheduled visit.  She has history of chronic GERD complicated by distal esophageal stricture which was dilated in October 2018 and more recently in July 2019.  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  She was last seen in March 2021 following an episode of chest pain which was felt to be noncardiac.  I felt chest pain was most likely due to esophagitis triggered by multiple episodes of vomiting that she had after she received second dose of Covid vaccine manufactured by [employer].  She states she is doing well as far as GERD symptoms are concerned.  She rarely has heartburn.  She denies dysphagia she may have occasional regurgitation.  She states she has had 1 or 2 episodes of vomiting since she was last seen. She is having some dental issues and has been staying on soft foods.  She has gained 5 pounds since her last visit.  Her new complaint today is one of constipation which has been worse over the last 2 months.  Previously it used to be periodic.  She tried MiraLAX but it did not work.  She has been using Dulcolax tablets for relief.  She she states she has gone as long as 2 to 3 weeks without a bowel movement.  With laxatives she is having bits and pieces are loose stools.  She denies abdominal pain melena or rectal bleeding.  She had colonoscopy in November 2011 and was normal. Her family history is negative for CRC.  Current Medications: Outpatient Encounter Medications as of 07/31/2020  Medication Sig  . acetaminophen (TYLENOL) 500 MG tablet Take 1,000 mg by mouth every 8 (eight) hours as needed for mild pain or moderate pain.   Marland Kitchen albuterol (PROVENTIL HFA;VENTOLIN HFA) 108 (90 Base) MCG/ACT inhaler Inhale 2 puffs into the lungs every 6 (six) hours as needed for wheezing or  shortness of breath.   Marland Kitchen amLODipine (NORVASC) 5 MG tablet Take 5 mg by mouth daily.  . bisacodyl (DULCOLAX) 5 MG EC tablet Take 5 mg by mouth. Patient states that she takes 10 mg as needed - last dose was 07/28/2020. Patient feels that she gets good results from this.  . Calcium Carb-Cholecalciferol (CALCIUM 600 + D PO) Take 1 tablet by mouth 2 (two) times daily.  . Cyanocobalamin (VITAMIN B 12 PO) Take 1,000 mg elemental calcium/kg/hr by mouth daily.  Marland Kitchen docusate sodium (COLACE) 100 MG capsule Take 2 capsules (200 mg total) by mouth at bedtime.  Marland Kitchen EPINEPHrine 0.3 mg/0.3 mL IJ SOAJ injection Inject into the muscle once.  . famotidine (PEPCID) 20 MG tablet TAKE 1 TABLET TWICE DAILY  . folic acid (FOLVITE) 1 MG tablet Take 1 mg by mouth daily.  . hydrOXYzine (ATARAX/VISTARIL) 25 MG tablet Take 25 mg by mouth 3 (three) times daily as needed.  . methotrexate 2.5 MG tablet Take 15 mg by mouth once a week. Patient is taking 2.5 mg -4 tablets every Wednesday  . MINERAL OIL PO Take by mouth. Dripper to ears as needed.  . Multiple Vitamin (MULTIVITAMIN WITH MINERALS) TABS tablet Take 1 tablet by mouth daily.  . nortriptyline (PAMELOR) 25 MG capsule Take 50 mg by mouth at bedtime.   Marland Kitchen OVER THE COUNTER MEDICATION Nasal Salt Lavage - Patient will take 2 daily on as needed basis.  . Polyethylene Glycol 3350 (  MIRALAX PO) Take by mouth. 3 tables spoons in the morning.  . silver sulfADIAZINE (SILVADENE) 1 % cream Apply 1 application topically daily. Applies daily to foot sores  . triamcinolone ointment (KENALOG) 0.1 % Apply 1 application topically at bedtime.    No facility-administered encounter medications on file as of 07/31/2020.    Objective: Blood pressure 128/86, pulse 94, temperature 99.4 F (37.4 C), temperature source Oral, height 5\' 8"  (1.727 m), weight 160 lb (72.6 kg). Patient is alert and in no acute distress. Oropharyngeal mucosa is normal. One of her left upper molars appears to be missing  crown. Conjunctiva is pink. Sclera is nonicteric Oropharyngeal mucosa is normal. No neck masses or thyromegaly noted. Cardiac exam with regular rhythm normal S1 and S2. No murmur or gallop noted. Lungs are clear to auscultation. Abdomen is symmetrical.  On palpation is soft and nontender with organomegaly or masses. No LE edema or clubbing noted. She has bilateral ankle braces. She has fresh scar over left palm from recent surgery for Dupuytren's contracture and an old scar involving right hand.  Labs/studies Results: Lab data from 03/01/2020 WBC 4.0 H&H 13.9 and 39.7 Platelet count 238K Glucose 91 BUN 8 and creatinine 0.83 Serum calcium 9.6 Bilirubin 0.5, AP 131 AST 25 ALT 16, total protein 6.7 and albumin 4.3. No TSH.  Assessment:  #1.  Chronic GERD complicated by distal esophageal stricture.  Her symptoms are well controlled with dietary measures and famotidine.  Prescription will be refilled today.  #2.  Constipation.  It appears she has had intermittent constipation for a while and now having progression.  She does not have any alarm symptoms.  She did not respond to polyethylene glycol.  Need to rule out hypothyroidism.   Plan:  Discontinue Colace and polyethylene glycol. Begin Linzess/linaclotide 72 mcg by mouth every morning.  Patient was given 2-week supply of samples along with a prescription. New prescription given for famotidine 20 mg p.o. twice daily for 90 days with 3 refills. Patient will have TSH with her next blood work to be done in October 2021. Patient advised not to take Dulcolax tablets unless absolutely necessary.  She should rather use Dulcolax suppository or fleets enema if she goes more than 2 days without a bowel movement. Office visit in 2 months. Patient advised to keep stool diary as to frequency and consistency of stools for 3 to 4 weeks prior to that visit. Will arrange for colonoscopy after her next visit.

## 2020-07-31 NOTE — Patient Instructions (Addendum)
TSH to be done with your next blood work. Increase intake of fiber rich foods when you are able to chew/dental issues fixed.  Consider eating apple daily. Keep stool diary as to frequency and consistency of stools for 3 to 4 weeks prior to her next visit in November 2021. Can use Dulcolax suppository or Fleet Enema if you go 2 days without a bowel movement.  Try not to use Dulcolax.

## 2020-08-08 ENCOUNTER — Ambulatory Visit (INDEPENDENT_AMBULATORY_CARE_PROVIDER_SITE_OTHER): Payer: Medicare Other | Admitting: Internal Medicine

## 2020-09-04 ENCOUNTER — Other Ambulatory Visit (INDEPENDENT_AMBULATORY_CARE_PROVIDER_SITE_OTHER): Payer: Self-pay | Admitting: Internal Medicine

## 2020-09-05 LAB — TSH: TSH: 1.78 u[IU]/mL (ref 0.450–4.500)

## 2020-10-03 ENCOUNTER — Other Ambulatory Visit: Payer: Self-pay

## 2020-10-03 ENCOUNTER — Ambulatory Visit (INDEPENDENT_AMBULATORY_CARE_PROVIDER_SITE_OTHER): Payer: Medicare Other | Admitting: Internal Medicine

## 2020-10-03 ENCOUNTER — Encounter (INDEPENDENT_AMBULATORY_CARE_PROVIDER_SITE_OTHER): Payer: Self-pay | Admitting: Internal Medicine

## 2020-10-03 VITALS — BP 110/75 | HR 105 | Ht 68.0 in | Wt 157.6 lb

## 2020-10-03 DIAGNOSIS — K219 Gastro-esophageal reflux disease without esophagitis: Secondary | ICD-10-CM

## 2020-10-03 DIAGNOSIS — K5900 Constipation, unspecified: Secondary | ICD-10-CM

## 2020-10-03 DIAGNOSIS — R748 Abnormal levels of other serum enzymes: Secondary | ICD-10-CM

## 2020-10-03 MED ORDER — LINACLOTIDE 72 MCG PO CAPS: 72.0000 ug | ORAL_CAPSULE | Freq: Every day | ORAL | 3 refills | Status: DC

## 2020-10-03 NOTE — Patient Instructions (Addendum)
Please ask Dr. Tarri Glenn to send me a copy of your next LFTs that you will have in 3 months Can try Linzess 2 capsules daily or every other day and see if it works better than 1 capsule daily. Screening colonoscopy to be scheduled in May 2022.

## 2020-10-03 NOTE — Progress Notes (Signed)
Presenting complaint;  Follow-up for chronic GERD and constipation.  Database and subjective:  Patient is 69 year old Caucasian female who has chronic GERD complicated by esophageal stricture as well as history of constipation who is here for scheduled visit.  She was last seen on 07/31/2020.  She was having issues with bowel movements.  Stool softener and polyethylene glycol combination was not working.  She was begun on Linzess/linaclotide 72 mcg by mouth daily.  She says she is doing better.  She is generally having 2-3 bowel movements per week.  She feels she has a sense of incomplete evacuation.  She is not having any side effects with Linzess.  She denies melena or rectal bleeding or abdominal pain. She says famotidine is working.  She denies dysphagia.  She has sporadic regurgitation and no more than once a month.  It generally occurs with certain foods.  She denies dysphagia. She is interested in screening colonoscopy.  Her last exam was normal in November 2011. Patient is open and methotrexate dose would be reduced by Dr. Tarri Glenn.  Current Medications: Outpatient Encounter Medications as of 10/03/2020  Medication Sig  . acetaminophen (TYLENOL) 500 MG tablet Take 1,000 mg by mouth every 8 (eight) hours as needed for mild pain or moderate pain.   Marland Kitchen acyclovir (ZOVIRAX) 400 MG tablet Take 400 mg by mouth as needed.  Marland Kitchen albuterol (PROVENTIL HFA;VENTOLIN HFA) 108 (90 Base) MCG/ACT inhaler Inhale 2 puffs into the lungs every 6 (six) hours as needed for wheezing or shortness of breath.   Marland Kitchen amLODipine (NORVASC) 5 MG tablet Take 5 mg by mouth daily.  . bisacodyl (DULCOLAX) 5 MG EC tablet Take 5 mg by mouth. Patient states that she takes 10 mg as needed - last dose was 07/28/2020. Patient feels that she gets good results from this.  . Calcium Carb-Cholecalciferol (CALCIUM 600 + D PO) Take 1 tablet by mouth 2 (two) times daily.  . Cyanocobalamin (VITAMIN B 12 PO) Take 1,000 mg elemental calcium/kg/hr  by mouth daily.  Marland Kitchen EPINEPHrine 0.3 mg/0.3 mL IJ SOAJ injection Inject into the muscle once.  . famotidine (PEPCID) 20 MG tablet Take 1 tablet (20 mg total) by mouth 2 (two) times daily.  . fluticasone (FLONASE) 50 MCG/ACT nasal spray Place into both nostrils daily.  . folic acid (FOLVITE) 1 MG tablet Take 1 mg by mouth daily.  Marland Kitchen gabapentin (NEURONTIN) 600 MG tablet Take by mouth.  . linaclotide (LINZESS) 72 MCG capsule Take 1 capsule (72 mcg total) by mouth daily before breakfast.  . methotrexate 2.5 MG tablet Take 15 mg by mouth once a week. Patient is taking 2.5 mg -4 tablets every Wednesday  . MINERAL OIL PO Take by mouth. Dripper to ears as needed.  . Multiple Vitamin (MULTIVITAMIN WITH MINERALS) TABS tablet Take 1 tablet by mouth daily.  Marland Kitchen OVER THE COUNTER MEDICATION Nasal Salt Lavage - Patient will take 2 daily on as needed basis.  Marland Kitchen silver sulfADIAZINE (SILVADENE) 1 % cream Apply 1 application topically daily. Applies daily to foot sores  . triamcinolone ointment (KENALOG) 0.1 % Apply 1 application topically at bedtime.   . [DISCONTINUED] hydrOXYzine (ATARAX/VISTARIL) 25 MG tablet Take 25 mg by mouth 3 (three) times daily as needed.  . [DISCONTINUED] nortriptyline (PAMELOR) 25 MG capsule Take 50 mg by mouth at bedtime.  (Patient not taking: Reported on 10/03/2020)   No facility-administered encounter medications on file as of 10/03/2020.     Objective: Blood pressure 110/75, pulse (!) 105, height 5'  8" (1.727 m), weight 157 lb 9.6 oz (71.5 kg). Patient is alert and in no acute distress. She is wearing a mask. Conjunctiva is pink. Sclera is nonicteric Oropharyngeal mucosa is normal. She has partial upper and lower dentures. No neck masses or thyromegaly noted. Cardiac exam with regular rhythm normal S1 and S2. No murmur or gallop noted. Lungs are clear to auscultation. Abdomen symmetrical soft and nontender without organomegaly or masses. No LE edema or clubbing noted. She has  bilateral ankle braces.  Lab data reviewed from 09/05/2020  WBC 4.2, H&H 13.7 and 39.4.  Platelet count 239K. Glucose 92, BUN 6, creatinine 0.86 Calcium 9.7 Bilirubin 0.5, AP 148(44-121) AST 17 and ALT 14 Total protein 7.1 and albumin 4.3.  Alkaline phosphatase 112 on 04/08/2017 Alkaline phosphatase 158 on 07/20/2019 Alkaline phosphatase 131 on 02/29/2020.   Assessment:  #1.  Chronic constipation.  She is doing better with Linzess but having no more than 2-3 soft stools per week.  She may do better with higher dose.  She can try Linzess 145 mcg every other day or daily. She is due for average risk screening colonoscopy.  #2.  Chronic GERD.  She is doing well with famotidine.  #3.  Mildly elevated alkaline phosphatase since September 2020 when it was 158 but it is trending down.  Transaminases are normal.  Suspect this may be related to methotrexate or one of her medications. No recent imaging in our system.  Will monitor trend and decide she needs further work-up.   Plan:  Patient will try Linzess 145 mcg daily or every other day and if she has no side effects with higher dose and she feels she is getting better result she can continue higher dose.  She has prescription for 72 mcg capsules.  If she takes double dose she will need refill earlier than planned. Patient will have LFTs when she has next blood work with Dr. Tarri Glenn. Schedule colonoscopy in May 2022. Office visit in 1 year.

## 2020-10-14 ENCOUNTER — Encounter (INDEPENDENT_AMBULATORY_CARE_PROVIDER_SITE_OTHER): Payer: Self-pay

## 2020-12-25 ENCOUNTER — Encounter (INDEPENDENT_AMBULATORY_CARE_PROVIDER_SITE_OTHER): Payer: Self-pay

## 2020-12-27 ENCOUNTER — Encounter (INDEPENDENT_AMBULATORY_CARE_PROVIDER_SITE_OTHER): Payer: Self-pay

## 2021-03-14 ENCOUNTER — Encounter (INDEPENDENT_AMBULATORY_CARE_PROVIDER_SITE_OTHER): Payer: Self-pay | Admitting: *Deleted

## 2021-04-02 ENCOUNTER — Other Ambulatory Visit (INDEPENDENT_AMBULATORY_CARE_PROVIDER_SITE_OTHER): Payer: Self-pay | Admitting: *Deleted

## 2021-04-03 ENCOUNTER — Other Ambulatory Visit (INDEPENDENT_AMBULATORY_CARE_PROVIDER_SITE_OTHER): Payer: Self-pay

## 2021-04-03 DIAGNOSIS — Z1211 Encounter for screening for malignant neoplasm of colon: Secondary | ICD-10-CM

## 2021-04-20 ENCOUNTER — Encounter (INDEPENDENT_AMBULATORY_CARE_PROVIDER_SITE_OTHER): Payer: Self-pay | Admitting: *Deleted

## 2021-04-20 ENCOUNTER — Telehealth (INDEPENDENT_AMBULATORY_CARE_PROVIDER_SITE_OTHER): Payer: Self-pay | Admitting: *Deleted

## 2021-04-20 MED ORDER — PEG 3350-KCL-NA BICARB-NACL 420 G PO SOLR: 4000.0000 mL | Freq: Once | ORAL | 0 refills | Status: AC

## 2021-04-20 NOTE — Telephone Encounter (Signed)
Done

## 2021-04-20 NOTE — Telephone Encounter (Signed)
Patient needs trilyte 

## 2021-04-30 ENCOUNTER — Encounter (INDEPENDENT_AMBULATORY_CARE_PROVIDER_SITE_OTHER): Payer: Self-pay

## 2021-04-30 NOTE — Telephone Encounter (Signed)
Has a TCS on 05/30/2021. Will discuss with Dr. Laural Golden.

## 2021-05-01 NOTE — Telephone Encounter (Signed)
Tried calling patient to get more information. I left a vm asking if she still needs our assistance to please call the office.

## 2021-05-01 NOTE — Telephone Encounter (Signed)
Tried calling several times left a message asked if still needs assistance to please call our office as I need more information.

## 2021-05-02 NOTE — Telephone Encounter (Signed)
I spoke with the patient and she states she has been having issue with reflux since last Thursday. She states she is having issues with it making her hoarse and feels like something is stuck in her throat she has history of a hiatal hernia and has issue with her esophagus any way. She states she takes Famotidine 20 mg one TID as she has been on a ppi in the past Protonix and pantoprazole she is allergic too she says as it cause her to have lupus of skin. She wants to know if she will need a EGD. If any medications need to be sent in she wants it sent ot [facility] [place] on [address].

## 2021-05-03 ENCOUNTER — Other Ambulatory Visit (INDEPENDENT_AMBULATORY_CARE_PROVIDER_SITE_OTHER): Payer: Self-pay

## 2021-05-03 MED ORDER — LANSOPRAZOLE 30 MG PO CPDR: 30.0000 mg | DELAYED_RELEASE_CAPSULE | Freq: Every day | ORAL | 3 refills | Status: DC

## 2021-05-03 NOTE — Telephone Encounter (Signed)
I spoke with the patient and she is aware of all. I have sent in prevacid 30 mg qd and she is aware to take famotidine 20 mg Qhs. Per Dr. Laural Golden patient needs to be on a PPI. He stats just because she had a reaction to the last protonix and pantoprazole does not mean she will have an allergic reaction to all PPIs. He recommends she start on Prevacid 30 mg once every am 30-45 minutes prior to breakfast and take a famotidine 20 mg QHS.

## 2021-05-11 ENCOUNTER — Telehealth (INDEPENDENT_AMBULATORY_CARE_PROVIDER_SITE_OTHER): Payer: Self-pay | Admitting: *Deleted

## 2021-05-11 NOTE — Telephone Encounter (Signed)
Referring MD/PCP: Macarthur Critchley  Procedure: tcs w propofol  Reason/Indication:  screening  Has patient had this procedure before?  Yes, 2011  If so, when, by whom and where?    Is there a family history of colon cancer?  no  Who?  What age when diagnosed?    Is patient diabetic? If yes, Type 1 or Type 2   no      Does patient have prosthetic heart valve or mechanical valve?  no  Do you have a pacemaker/defibrillator?  no  Has patient ever had endocarditis/atrial fibrillation? no  Does patient use oxygen? no  Has patient had joint replacement within last 12 months?  no  Is patient constipated or do they take laxatives? yes  Does patient have a history of alcohol/drug use?  no  Have you had a stroke/heart attack last 6 mths? no  Do you take medicine for weight loss?  no  For female patients,: do you still have your menstrual cycle? no  Is patient on blood thinner such as Coumadin, Plavix and/or Aspirin? no  Medications: gabapentin 300 mg tid, amlodipine 5 mg daily, famotidine 20 mg bid, vit b12 daily, fluticasone daily, acyclovir 400 mg tid, linzess 75 mcg daily, methotrexate 2.5 mg 4 tabs every Wednesday,   folic acid 1 mg daily, hydroxyzine 25 mg daily, traimcinolone ointment, tylen prn, calcium daily, one a day vitamin, ventolin prn  Allergies: sulfa, demerol, CMT, ranitidine, doxycycline, plaquenil  Medication Adjustment per Dr Rehman/Dr Jenetta Downer   Procedure date & time: 05/30/21

## 2021-05-22 ENCOUNTER — Encounter (INDEPENDENT_AMBULATORY_CARE_PROVIDER_SITE_OTHER): Payer: Self-pay

## 2021-05-23 ENCOUNTER — Other Ambulatory Visit (INDEPENDENT_AMBULATORY_CARE_PROVIDER_SITE_OTHER): Payer: Self-pay | Admitting: Internal Medicine

## 2021-05-25 NOTE — Patient Instructions (Signed)
Jacklyn Branan  05/25/2021     @PREFPERIOPPHARMACY @   Your procedure is scheduled on   05/30/2021.   Report to [facility] at  0730  A.M.   Call this number if you have problems the morning of surgery:  [phone]   Remember:  Follow the diet and prep instructions given to you by the office.    Take these medicines the morning of surgery with A SIP OF WATER      pepcid, amlodipine, gabapentin, hydroxyzine (if needed), prevacid.              Use your inhale before you come and bring your rescue inhaler with you.     Do not wear jewelry, make-up or nail polish.  Do not wear lotions, powders, or perfumes, or deodorant.  Do not shave 48 hours prior to surgery.  Men may shave face and neck.  Do not bring valuables to the hospital.  [facility] is not responsible for any belongings or valuables.  Contacts, dentures or bridgework may not be worn into surgery.  Leave your suitcase in the car.  After surgery it may be brought to your room.  For patients admitted to the hospital, discharge time will be determined by your treatment team.  Patients discharged the day of surgery will not be allowed to drive home and must have someone with them for 24 hours.    Special instructions:    DO NOT smoke tobacco or vape for 24 hours before your procedure.  Please read over the following fact sheets that you were given. Anesthesia Post-op Instructions and Care and Recovery After Surgery      Colonoscopy, Adult, Care After This sheet gives you information about how to care for yourself after your procedure. Your health care provider may also give you more specific instructions. If you have problems or questions, contact your health careprovider. What can I expect after the procedure? After the procedure, it is common to have: A small amount of blood in your stool for 24 hours after the procedure. Some gas. Mild cramping or bloating of your abdomen. Follow these instructions at  home: Eating and drinking  Drink enough fluid to keep your urine pale yellow. Follow instructions from your health care provider about eating or drinking restrictions. Resume your normal diet as instructed by your health care provider. Avoid heavy or fried foods that are hard to digest.  Activity Rest as told by your health care provider. Avoid sitting for a long time without moving. Get up to take short walks every 1-2 hours. This is important to improve blood flow and breathing. Ask for help if you feel weak or unsteady. Return to your normal activities as told by your health care provider. Ask your health care provider what activities are safe for you. Managing cramping and bloating  Try walking around when you have cramps or feel bloated. Apply heat to your abdomen as told by your health care provider. Use the heat source that your health care provider recommends, such as a moist heat pack or a heating pad. Place a towel between your skin and the heat source. Leave the heat on for 20-30 minutes. Remove the heat if your skin turns bright red. This is especially important if you are unable to feel pain, heat, or cold. You may have a greater risk of getting burned.  General instructions If you were given a sedative during the procedure, it can affect you for  several hours. Do not drive or operate machinery until your health care provider says that it is safe. For the first 24 hours after the procedure: Do not sign important documents. Do not drink alcohol. Do your regular daily activities at a slower pace than normal. Eat soft foods that are easy to digest. Take over-the-counter and prescription medicines only as told by your health care provider. Keep all follow-up visits as told by your health care provider. This is important. Contact a health care provider if: You have blood in your stool 2-3 days after the procedure. Get help right away if you have: More than a small spotting of  blood in your stool. Large blood clots in your stool. Swelling of your abdomen. Nausea or vomiting. A fever. Increasing pain in your abdomen that is not relieved with medicine. Summary After the procedure, it is common to have a small amount of blood in your stool. You may also have mild cramping and bloating of your abdomen. If you were given a sedative during the procedure, it can affect you for several hours. Do not drive or operate machinery until your health care provider says that it is safe. Get help right away if you have a lot of blood in your stool, nausea or vomiting, a fever, or increased pain in your abdomen. This information is not intended to replace advice given to you by your health care provider. Make sure you discuss any questions you have with your healthcare provider. Document Revised: 10/29/2019 Document Reviewed: 05/31/2019 Elsevier Patient Education  [place] After This sheet gives you information about how to care for yourself after your procedure. Your health care provider may also give you more specific instructions. If you have problems or questions, contact your health careprovider. What can I expect after the procedure? After the procedure, it is common to have: Tiredness. Forgetfulness about what happened after the procedure. Impaired judgment for important decisions. Nausea or vomiting. Some difficulty with balance. Follow these instructions at home: For the time period you were told by your health care provider:     Rest as needed. Do not participate in activities where you could fall or become injured. Do not drive or use machinery. Do not drink alcohol. Do not take sleeping pills or medicines that cause drowsiness. Do not make important decisions or sign legal documents. Do not take care of children on your own. Eating and drinking Follow the diet that is recommended by your health care provider. Drink  enough fluid to keep your urine pale yellow. If you vomit: Drink water, juice, or soup when you can drink without vomiting. Make sure you have little or no nausea before eating solid foods. General instructions Have a responsible adult stay with you for the time you are told. It is important to have someone help care for you until you are awake and alert. Take over-the-counter and prescription medicines only as told by your health care provider. If you have sleep apnea, surgery and certain medicines can increase your risk for breathing problems. Follow instructions from your health care provider about wearing your sleep device: Anytime you are sleeping, including during daytime naps. While taking prescription pain medicines, sleeping medicines, or medicines that make you drowsy. Avoid smoking. Keep all follow-up visits as told by your health care provider. This is important. Contact a health care provider if: You keep feeling nauseous or you keep vomiting. You feel light-headed. You are still sleepy or having trouble  with balance after 24 hours. You develop a rash. You have a fever. You have redness or swelling around the IV site. Get help right away if: You have trouble breathing. You have new-onset confusion at home. Summary For several hours after your procedure, you may feel tired. You may also be forgetful and have poor judgment. Have a responsible adult stay with you for the time you are told. It is important to have someone help care for you until you are awake and alert. Rest as told. Do not drive or operate machinery. Do not drink alcohol or take sleeping pills. Get help right away if you have trouble breathing, or if you suddenly become confused. This information is not intended to replace advice given to you by your health care provider. Make sure you discuss any questions you have with your healthcare provider. Document Revised: 07/20/2020 Document Reviewed:  10/07/2019 Elsevier Patient Education  2022 [employer].

## 2021-05-28 ENCOUNTER — Encounter (HOSPITAL_COMMUNITY)
Admission: RE | Admit: 2021-05-28 | Discharge: 2021-05-28 | Disposition: A | Discharge status: Home / Self Care | Payer: Medicare Other | Source: Ambulatory Visit | Attending: Internal Medicine | Admitting: Internal Medicine

## 2021-05-28 ENCOUNTER — Other Ambulatory Visit: Payer: Self-pay

## 2021-05-28 ENCOUNTER — Other Ambulatory Visit (HOSPITAL_COMMUNITY): Payer: Medicare Other

## 2021-05-28 ENCOUNTER — Encounter (HOSPITAL_COMMUNITY): Payer: Self-pay

## 2021-05-28 DIAGNOSIS — Z1211 Encounter for screening for malignant neoplasm of colon: Secondary | ICD-10-CM

## 2021-05-28 DIAGNOSIS — Z01810 Encounter for preprocedural cardiovascular examination: Secondary | ICD-10-CM | POA: Diagnosis present

## 2021-05-28 HISTORY — DX: Hereditary motor and sensory neuropathy: G60.0

## 2021-05-28 HISTORY — DX: Reserved for concepts with insufficient information to code with codable children: IMO0002

## 2021-05-28 HISTORY — DX: Unspecified osteoarthritis, unspecified site: M19.90

## 2021-05-28 HISTORY — DX: Systemic lupus erythematosus, unspecified: M32.9

## 2021-05-30 ENCOUNTER — Ambulatory Visit (HOSPITAL_COMMUNITY): Payer: Medicare Other | Admitting: Anesthesiology

## 2021-05-30 ENCOUNTER — Encounter (HOSPITAL_COMMUNITY)
Admission: RE | Disposition: A | Discharge status: Home / Self Care | Payer: Self-pay | Source: Home / Self Care | Attending: Internal Medicine

## 2021-05-30 ENCOUNTER — Other Ambulatory Visit: Payer: Self-pay

## 2021-05-30 ENCOUNTER — Ambulatory Visit (HOSPITAL_COMMUNITY)
Admission: RE | Admit: 2021-05-30 | Discharge: 2021-05-30 | Disposition: A | Discharge status: Home / Self Care | Payer: Medicare Other | Attending: Internal Medicine | Admitting: Internal Medicine

## 2021-05-30 ENCOUNTER — Encounter (HOSPITAL_COMMUNITY): Payer: Self-pay | Admitting: Internal Medicine

## 2021-05-30 DIAGNOSIS — Z882 Allergy status to sulfonamides status: Secondary | ICD-10-CM | POA: Diagnosis not present

## 2021-05-30 DIAGNOSIS — Z9049 Acquired absence of other specified parts of digestive tract: Secondary | ICD-10-CM | POA: Diagnosis not present

## 2021-05-30 DIAGNOSIS — K635 Polyp of colon: Secondary | ICD-10-CM | POA: Diagnosis not present

## 2021-05-30 DIAGNOSIS — D125 Benign neoplasm of sigmoid colon: Secondary | ICD-10-CM

## 2021-05-30 DIAGNOSIS — Z79899 Other long term (current) drug therapy: Secondary | ICD-10-CM | POA: Insufficient documentation

## 2021-05-30 DIAGNOSIS — Z90711 Acquired absence of uterus with remaining cervical stump: Secondary | ICD-10-CM | POA: Diagnosis not present

## 2021-05-30 DIAGNOSIS — Z885 Allergy status to narcotic agent status: Secondary | ICD-10-CM | POA: Diagnosis not present

## 2021-05-30 DIAGNOSIS — Z888 Allergy status to other drugs, medicaments and biological substances status: Secondary | ICD-10-CM | POA: Insufficient documentation

## 2021-05-30 DIAGNOSIS — K621 Rectal polyp: Secondary | ICD-10-CM

## 2021-05-30 DIAGNOSIS — Z1211 Encounter for screening for malignant neoplasm of colon: Secondary | ICD-10-CM | POA: Insufficient documentation

## 2021-05-30 DIAGNOSIS — Z881 Allergy status to other antibiotic agents status: Secondary | ICD-10-CM | POA: Diagnosis not present

## 2021-05-30 DIAGNOSIS — K5909 Other constipation: Secondary | ICD-10-CM | POA: Insufficient documentation

## 2021-05-30 HISTORY — PX: POLYPECTOMY: SHX5525

## 2021-05-30 HISTORY — PX: COLONOSCOPY WITH PROPOFOL: SHX5780

## 2021-05-30 LAB — HM COLONOSCOPY

## 2021-05-30 SURGERY — COLONOSCOPY WITH PROPOFOL
Anesthesia: General

## 2021-05-30 MED ORDER — SCOPOLAMINE 1 MG/3DAYS TD PT72
MEDICATED_PATCH | TRANSDERMAL | Status: AC
  Administered 2021-05-30: 1.5 mg via TRANSDERMAL
  Filled 2021-05-30: qty 1

## 2021-05-30 MED ORDER — ONDANSETRON HCL 4 MG/2ML IJ SOLN
INTRAMUSCULAR | Status: DC | PRN
  Administered 2021-05-30: 4 mg via INTRAVENOUS

## 2021-05-30 MED ORDER — STERILE WATER FOR IRRIGATION IR SOLN
Status: DC | PRN
  Administered 2021-05-30: 100 mL

## 2021-05-30 MED ORDER — LACTATED RINGERS IV SOLN: INTRAVENOUS | Status: DC

## 2021-05-30 MED ORDER — SCOPOLAMINE 1 MG/3DAYS TD PT72: 1.0000 | MEDICATED_PATCH | Freq: Once | TRANSDERMAL | Status: DC

## 2021-05-30 MED ORDER — PROPOFOL 500 MG/50ML IV EMUL
INTRAVENOUS | Status: DC | PRN
  Administered 2021-05-30: 125 ug/kg/min via INTRAVENOUS

## 2021-05-30 MED ORDER — PROPOFOL 10 MG/ML IV BOLUS
INTRAVENOUS | Status: AC
  Filled 2021-05-30: qty 80

## 2021-05-30 MED ORDER — PROPOFOL 10 MG/ML IV BOLUS
INTRAVENOUS | Status: DC | PRN
  Administered 2021-05-30: 80 mg via INTRAVENOUS

## 2021-05-30 NOTE — H&P (Signed)
Marie Calhoun is an 70 y.o. female.   Chief Complaint: Patient is here for colonoscopy. HPI: Patient is 70 year old Caucasian female who is here for screening colonoscopy.  She has chronic constipation.  With help her bowels move 2-3 times a week.  Generally eats good evacuation.  She denies melena or rectal bleeding.   Last colonoscopy was normal in November 2011. Family history is negative for CRC. Patient does not take aspirin or anticoagulants.  Past Medical History:  Diagnosis Date   Anxiety    Arthritis    Asthma 05/28/2017   Charcot-Marie-Tooth disease    Depression    Environmental allergies 05/28/2017   Esophageal stricture 05/28/2017   Essential hypertension, benign 05/28/2017   GERD (gastroesophageal reflux disease) 05/28/2017   Hypercholesteremia    Lupus (HCC)    of skin   PONV (postoperative nausea and vomiting)    Seasonal allergies     Past Surgical History:  Procedure Laterality Date   breast tumor\     left benign   CHOLECYSTECTOMY     ESOPHAGEAL DILATION N/A 09/11/2017   Procedure: ESOPHAGEAL DILATION;  Surgeon: Rogene Houston, MD;  [facility];  Service: Endoscopy;  Laterality: N/A;   ESOPHAGEAL DILATION N/A 06/17/2018   Procedure: ESOPHAGEAL DILATION;  Surgeon: Rogene Houston, MD;  [facility]: [facility];  Service: Endoscopy;  Laterality: N/A;   ESOPHAGOGASTRODUODENOSCOPY N/A 09/11/2017   Procedure: ESOPHAGOGASTRODUODENOSCOPY (EGD);  Surgeon: Rogene Houston, MD;  Location: [facility];  Service: Endoscopy;  Laterality: N/A;  1:40 - Can NOT come earlier   ESOPHAGOGASTRODUODENOSCOPY N/A 06/17/2018   Procedure: ESOPHAGOGASTRODUODENOSCOPY (EGD);  Surgeon: Rogene Houston, MD;  Location: [facility];  Service: Endoscopy;  Laterality: N/A;  10:30   hand tendon release Left    knee surgeries     [place]    History reviewed. No pertinent family history. Social History:   reports that she has never smoked. She has never used smokeless tobacco. She reports that she does not drink alcohol and does not use drugs.  Allergies:  Allergies  Allergen Reactions   Doxycycline Itching and Rash   Demerol [Meperidine] Nausea Only    nausea   Pantoprazole     Subacute Cutaneous Lupus of the Skin   Plaquenil [Hydroxychloroquine]     Caused another Rash on top of Lupus Rash   Sulfa Antibiotics Swelling    *Childhood*   Ranitidine Rash    Medications Prior to Admission  Medication Sig Dispense Refill   acetaminophen (TYLENOL) 500 MG tablet Take 1,000 mg by mouth in the morning.     amLODipine (NORVASC) 5 MG tablet Take 5 mg by mouth in the morning.     Calcium Carb-Cholecalciferol (CALCIUM 600 + D PO) Take 2 tablets by mouth in the morning.     famotidine (PEPCID) 20 MG tablet TAKE 1 TABLET TWICE DAILY 810 tablet 3   folic acid (FOLVITE) 1 MG tablet Take 1 mg by mouth in the morning.     gabapentin (NEURONTIN) 300 MG capsule Take 300 mg by mouth in the morning, at noon, and at bedtime.     lansoprazole (PREVACID) 30 MG capsule Take 1 capsule (30 mg total) by mouth daily. 30-45 minutes prior to eating breakfast in the mornings. (Patient taking differently: Take 30 mg by mouth daily before breakfast. 30-45 minutes prior to eating breakfast in the mornings.) 90 capsule  3   linaclotide (LINZESS) 72 MCG capsule Take 1 capsule (72 mcg total) by mouth daily before breakfast. (Patient taking differently: Take 72 mcg by mouth in the morning.) 90 capsule 3   methotrexate 2.5 MG tablet Take 10 mg by mouth every Wednesday.     Multiple Vitamin (MULTIVITAMIN WITH MINERALS) TABS tablet Take 1 tablet by mouth in the morning.     Omega-3 Fatty Acids (FISH OIL) 1000 MG CAPS Take 1,000 mg by mouth in the morning.     polyethylene glycol-electrolytes (NULYTELY) 420 g solution Take 4,000 mLs by mouth as directed.     Propylene Glycol (SYSTANE BALANCE) 0.6 % SOLN Place 1 drop into both eyes  in the morning, at noon, and at bedtime.     silver sulfADIAZINE (SILVADENE) 1 % cream Apply 1 application topically daily as needed (foot wounds/sores).     triamcinolone ointment (KENALOG) 0.5 % Apply 1 application topically 2 (two) times daily as needed (skin itching/irritation).     vitamin B-12 (CYANOCOBALAMIN) 1000 MCG tablet Take 1,000 mcg by mouth in the morning.     White Petrolatum-Mineral Oil (GENTEAL TEARS NIGHT-TIME) OINT Apply 1 application to eye at bedtime.     acyclovir (ZOVIRAX) 400 MG tablet Take 400 mg by mouth 3 (three) times daily as needed (for cold sores/fever blisters (takes for 7 days when needed)).     albuterol (PROVENTIL HFA;VENTOLIN HFA) 108 (90 Base) MCG/ACT inhaler Inhale 2 puffs into the lungs every 6 (six) hours as needed for wheezing or shortness of breath.      fluticasone (FLONASE) 50 MCG/ACT nasal spray Place 1-2 sprays into both nostrils daily as needed for allergies.     hydrOXYzine (ATARAX/VISTARIL) 25 MG tablet Take 25 mg by mouth 3 (three) times daily as needed for itching.     MINERAL OIL PO Place 1 application into both ears. Dripper to ears as needed.     Sodium Chloride-Sodium Bicarb (AYR SALINE NASAL RINSE NA) Place 1 application into the nose 2 (two) times daily as needed (allergies/nasal congestion.).      No results found for this or any previous visit (from the past 48 hour(s)). No results found.  Review of Systems  Blood pressure 136/88, pulse 84, temperature 98.8 F (37.1 C), temperature source Oral, resp. rate 17, SpO2 94 %. Physical Exam HENT:     Mouth/Throat:     Mouth: Mucous membranes are moist.     Pharynx: Oropharynx is clear.  Eyes:     General: No scleral icterus.    Conjunctiva/sclera: Conjunctivae normal.  Cardiovascular:     Rate and Rhythm: Normal rate and regular rhythm.     Heart sounds: Normal heart sounds. No murmur heard. Pulmonary:     Effort: Pulmonary effort is normal.     Breath sounds: Normal breath sounds.   Abdominal:     General: There is no distension.     Palpations: Abdomen is soft. There is no mass.     Tenderness: There is no abdominal tenderness.  Musculoskeletal:        General: No swelling.     Cervical back: Neck supple.  Lymphadenopathy:     Cervical: No cervical adenopathy.  Skin:    General: Skin is warm and dry.  Neurological:     Mental Status: She is alert.     Assessment/Plan  Average risk screening colonoscopy.  Hildred Laser, MD 05/30/2021, 9:03 AM

## 2021-05-30 NOTE — Transfer of Care (Signed)
Immediate Anesthesia Transfer of Care Note  Patient: Marie Calhoun  Procedure(s) Performed: COLONOSCOPY WITH PROPOFOL POLYPECTOMY  Patient Location: Short Stay  Anesthesia Type:General  Level of Consciousness: awake, alert  and oriented  Airway & Oxygen Therapy: Patient Spontanous Breathing  Post-op Assessment: Report given to RN and Post -op Vital signs reviewed and stable  Post vital signs: Reviewed and stable  Last Vitals:  Vitals Value Taken Time  BP    Temp    Pulse 70 05/30/21 0943  Resp 17 05/30/21  0943  SpO2 97 05/30/21  0943    Last Pain:  Vitals:   05/30/21 0910  TempSrc:   PainSc: 0-No pain      Patients Stated Pain Goal: 5 (82/42/35 3614)  Complications: No notable events documented.

## 2021-05-30 NOTE — Discharge Instructions (Addendum)
No aspirin or NSAIDs for 24 hours.   °Resume usual medications as before. °High-fiber diet. °No driving for 24 hours. °Physician will call with biopsy results. °

## 2021-05-30 NOTE — Anesthesia Preprocedure Evaluation (Signed)
Anesthesia Evaluation  Patient identified by MRN, date of birth, ID band Patient awake    Reviewed: Allergy & Precautions, H&P , NPO status , Patient's Chart, lab work & pertinent test results, reviewed documented beta blocker date and time   History of Anesthesia Complications (+) PONV and history of anesthetic complications  Airway Mallampati: II  TM Distance: >3 FB Neck ROM: full    Dental no notable dental hx.    Pulmonary asthma ,    Pulmonary exam normal breath sounds clear to auscultation       Cardiovascular Exercise Tolerance: Good hypertension, negative cardio ROS   Rhythm:regular Rate:Normal     Neuro/Psych PSYCHIATRIC DISORDERS Anxiety Depression  Neuromuscular disease    GI/Hepatic Neg liver ROS, GERD  Medicated,  Endo/Other  negative endocrine ROS  Renal/GU negative Renal ROS  negative genitourinary   Musculoskeletal   Abdominal   Peds  Hematology negative hematology ROS (+)   Anesthesia Other Findings   Reproductive/Obstetrics negative OB ROS                             Anesthesia Physical Anesthesia Plan  ASA: 3  Anesthesia Plan: General   Post-op Pain Management:    Induction:   PONV Risk Score and Plan: Propofol infusion and Scopolamine patch - Pre-op  Airway Management Planned:   Additional Equipment:   Intra-op Plan:   Post-operative Plan:   Informed Consent: I have reviewed the patients History and Physical, chart, labs and discussed the procedure including the risks, benefits and alternatives for the proposed anesthesia with the patient or authorized representative who has indicated his/her understanding and acceptance.     Dental Advisory Given  Plan Discussed with: CRNA  Anesthesia Plan Comments:         Anesthesia Quick Evaluation

## 2021-05-30 NOTE — Anesthesia Postprocedure Evaluation (Signed)
Anesthesia Post Note  Patient: Marie Calhoun  Procedure(s) Performed: COLONOSCOPY WITH PROPOFOL POLYPECTOMY  Patient location during evaluation: Phase II Anesthesia Type: General Level of consciousness: awake Pain management: pain level controlled Vital Signs Assessment: post-procedure vital signs reviewed and stable Respiratory status: spontaneous breathing and respiratory function stable Cardiovascular status: blood pressure returned to baseline and stable Postop Assessment: no headache and no apparent nausea or vomiting Anesthetic complications: no Comments: Late entry   No notable events documented.   Last Vitals:  Vitals:   05/30/21 0753 05/30/21 0943  BP: 136/88 115/79  Pulse: 84 91  Resp: 17 16  Temp: 37.1 C 36.6 C  SpO2: 94% 99%    Last Pain:  Vitals:   05/30/21 0943  TempSrc: Oral  PainSc: 0-No pain                 Louann Sjogren

## 2021-05-30 NOTE — Op Note (Signed)
[facility] Patient Name: Marie Calhoun Procedure Date: 05/30/2021 8:46 AM MRN: [id number] Date of Birth: [birth year] Attending MD: Hildred Laser , MD CSN: [id number] Age: 70 Admit Type: Outpatient Procedure:                Colonoscopy Indications:              Screening for colorectal malignant neoplasm Providers:                Hildred Laser, MD, Lambert Mody, Aram Candela Referring MD:             Earney Mallet, MD Medicines:                Propofol per Anesthesia Complications:            No immediate complications. Estimated Blood Loss:     Estimated blood loss was minimal. Procedure:                Pre-Anesthesia Assessment:                           - Prior to the procedure, a History and Physical                            was performed, and patient medications and                            allergies were reviewed. The patient's tolerance of                            previous anesthesia was also reviewed. The risks                            and benefits of the procedure and the sedation                            options and risks were discussed with the patient.                            All questions were answered, and informed consent                            was obtained. Prior Anticoagulants: The patient has                            taken no previous anticoagulant or antiplatelet                            agents. ASA Grade Assessment: III - A patient with                            severe systemic disease. After reviewing the risks                            and benefits, the patient was deemed in  satisfactory condition to undergo the procedure.                           After obtaining informed consent, the colonoscope                            was passed under direct vision. Throughout the                            procedure, the patient's blood pressure, pulse, and                            oxygen saturations were  monitored continuously. The                            [id number]-[id number] ([id number]) scope was introduced through                            the anus and advanced to the the cecum, identified                            by appendiceal orifice and ileocecal valve. The                            colonoscopy was somewhat difficult due to a                            redundant colon. Successful completion of the                            procedure was aided by using manual pressure and                            withdrawing and reinserting the scope. The patient                            tolerated the procedure well. The quality of the                            bowel preparation was good. The ileocecal valve,                            appendiceal orifice, and rectum were photographed. Scope In: 9:14:30 AM Scope Out: 9:38:24 AM Scope Withdrawal Time: 0 hours 16 minutes 23 seconds  Total Procedure Duration: 0 hours 23 minutes 54 seconds  Findings:      The perianal and digital rectal examinations were normal.      Two polyps were found in the rectum and sigmoid colon. The polyps were 4       to 7 mm in size. These polyps were removed with a cold snare. Resection       and retrieval were complete. The pathology specimen was placed into       Bottle Number 1.      The retroflexed view of the distal rectum and  anal verge was normal and       showed no anal or rectal abnormalities. Impression:               - Two 4 to 7 mm polyps in the rectum and in the                            sigmoid colon, removed with a cold snare. Resected                            and retrieved. Moderate Sedation:      Per Anesthesia Care Recommendation:           - Patient has a contact number available for                            emergencies. The signs and symptoms of potential                            delayed complications were discussed with the                            patient. Return to normal activities  tomorrow.                            Written discharge instructions were provided to the                            patient.                           - Resume previous diet today.                           - Continue present medications.                           - No aspirin, ibuprofen, naproxen, or other                            non-steroidal anti-inflammatory drugs for 1 day.                           - Await pathology results.                           - Repeat colonoscopy is recommended. The                            colonoscopy date will be determined after pathology                            results from today's exam become available for                            review. Procedure Code(s):        --- Professional ---  45385, Colonoscopy, flexible; with removal of                            tumor(s), polyp(s), or other lesion(s) by snare                            technique Diagnosis Code(s):        --- Professional ---                           Z12.11, Encounter for screening for malignant                            neoplasm of colon                           K62.1, Rectal polyp                           K63.5, Polyp of colon CPT copyright 2019 [employer]. All rights reserved. The codes documented in this report are preliminary and upon coder review may  be revised to meet current compliance requirements. Hildred Laser, MD Hildred Laser, MD 05/30/2021 9:45:33 AM This report has been signed electronically. Number of Addenda: 0

## 2021-05-31 LAB — SURGICAL PATHOLOGY

## 2021-06-05 ENCOUNTER — Encounter (HOSPITAL_COMMUNITY): Payer: Self-pay | Admitting: Internal Medicine

## 2021-06-05 ENCOUNTER — Encounter (INDEPENDENT_AMBULATORY_CARE_PROVIDER_SITE_OTHER): Payer: Self-pay | Admitting: *Deleted

## 2021-06-21 ENCOUNTER — Other Ambulatory Visit (INDEPENDENT_AMBULATORY_CARE_PROVIDER_SITE_OTHER): Payer: Self-pay | Admitting: Internal Medicine

## 2021-07-18 ENCOUNTER — Other Ambulatory Visit (INDEPENDENT_AMBULATORY_CARE_PROVIDER_SITE_OTHER): Payer: Self-pay | Admitting: Internal Medicine

## 2021-07-18 ENCOUNTER — Other Ambulatory Visit (INDEPENDENT_AMBULATORY_CARE_PROVIDER_SITE_OTHER): Payer: Self-pay

## 2021-07-26 ENCOUNTER — Ambulatory Visit (INDEPENDENT_AMBULATORY_CARE_PROVIDER_SITE_OTHER): Payer: Medicare Other | Admitting: Pulmonary Disease

## 2021-07-26 ENCOUNTER — Encounter: Payer: Self-pay | Admitting: Pulmonary Disease

## 2021-07-26 ENCOUNTER — Other Ambulatory Visit: Payer: Self-pay

## 2021-07-26 ENCOUNTER — Ambulatory Visit (HOSPITAL_COMMUNITY)
Admission: RE | Admit: 2021-07-26 | Discharge: 2021-07-26 | Disposition: A | Discharge status: Home / Self Care | Payer: Medicare Other | Source: Ambulatory Visit | Attending: Pulmonary Disease | Admitting: Pulmonary Disease

## 2021-07-26 VITALS — BP 130/62 | HR 83 | Temp 97.9°F | Ht 68.0 in | Wt 159.0 lb

## 2021-07-26 DIAGNOSIS — R0609 Other forms of dyspnea: Secondary | ICD-10-CM

## 2021-07-26 DIAGNOSIS — R0600 Dyspnea, unspecified: Secondary | ICD-10-CM | POA: Insufficient documentation

## 2021-07-26 NOTE — Progress Notes (Signed)
Subjective:    Patient ID: Marie Calhoun, female    DOB: November 21, 1950, 70 y.o.   MRN: [id number]  HPI  Chief Complaint  Patient presents with   Consult    Consult. Breathing issues. Says during her colonoscopy her O2 dropped below 90. SOB when moving around or doing a lot of moving. Says she needs to sit down after moving around a lot to catch her breath. For about 2 months patient says that she is having some persistent coughing she states she is not coughing anything up.    70 year old never smoker who presents for evaluation of dyspnea on exertion since May 2022.  She reports getting fatigued easily.  She underwent endoscopy procedure by GI under propofol sedation 05/2021 and was told that her oxygen saturation was dropping.  Since then she has kept an oxygen saturation record at home and her saturations have stayed about 95%.  She does mention that her left hand does not require saturation well and wonders if this was related. She was followed at the pulmonary office in [facility] by Dr. Farris Has for many years for asthma and allergies, she quit taking allergy shots in 2018 when she went on methotrexate for subcutaneous lupus and since then has only been on Nasonex.  She denies cough wheezing or frequent chest colds.  She has albuterol MDI which she uses once about 3-4 times per week. 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 COVID infection January 2021   Labs 4/20 [place] show hemoglobin of 14.9, no leukocytosis, normal electrolytes and renal function and normal LFTs    Past Medical History:  Diagnosis Date   Anxiety    Arthritis    Asthma 05/28/2017   Charcot-Marie-Tooth disease    Depression    Environmental allergies 05/28/2017   Esophageal stricture 05/28/2017   Essential hypertension, benign 05/28/2017   GERD (gastroesophageal reflux disease)  05/28/2017   Hypercholesteremia    Lupus (HCC)    of skin   PONV (postoperative nausea and vomiting)    Seasonal allergies     Past Surgical History:  Procedure Laterality Date   breast tumor\     left benign   CHOLECYSTECTOMY     COLONOSCOPY WITH PROPOFOL N/A 05/30/2021   Procedure: COLONOSCOPY WITH PROPOFOL;  Surgeon: Rogene Houston, MD;  [facility];  Service: Endoscopy;  Laterality: N/A;  [place] N/A 09/11/2017   Procedure: ESOPHAGEAL DILATION;  Surgeon: Rogene Houston, MD;  Location: [facility];  Service: Endoscopy;  Laterality: N/A;   ESOPHAGEAL DILATION N/A 06/17/2018   Procedure: ESOPHAGEAL DILATION;  Surgeon: Rogene Houston, MD;  Location: [facility];  Service: Endoscopy;  Laterality: N/A;   ESOPHAGOGASTRODUODENOSCOPY N/A 09/11/2017   Procedure: ESOPHAGOGASTRODUODENOSCOPY (EGD);  Surgeon: Rogene Houston, MD;  Location: [facility];  Service: Endoscopy;  Laterality: N/A;  1:40 - Can NOT come earlier   ESOPHAGOGASTRODUODENOSCOPY N/A 06/17/2018   Procedure: ESOPHAGOGASTRODUODENOSCOPY (EGD);  Surgeon: Rogene Houston, MD;  Location: [facility];  Service: Endoscopy;  Laterality: N/A;  10:30   hand tendon release Left    knee surgeries     [place]   POLYPECTOMY  05/30/2021   Procedure: POLYPECTOMY;  Surgeon: Rogene Houston, MD;  Location:  [facility];  Service: Endoscopy;;    Allergies  Allergen Reactions   Doxycycline Itching and Rash   Demerol [Meperidine] Nausea Only    nausea   Pantoprazole     Subacute Cutaneous Lupus of the Skin   Plaquenil [Hydroxychloroquine]     Caused another Rash on top of Lupus Rash   Sulfa Antibiotics Swelling    *Childhood*   Ranitidine Rash     Social History   Socioeconomic History   Marital status: Single    Spouse name: Not on file   Number of children: Not on file   Years of education: Not on file   Highest  education level: Not on file  Occupational History   Not on file  Tobacco Use   Smoking status: Never   Smokeless tobacco: Never  Vaping Use   Vaping Use: Never used  Substance and Sexual Activity   Alcohol use: No   Drug use: No   Sexual activity: Not on file  Other Topics Concern   Not on file  Social History Narrative   Not on file   Social Determinants of Health   Financial Resource Strain: Not on file  Food Insecurity: Not on file  Transportation Needs: Not on file  Physical Activity: Not on file  Stress: Not on file  Social Connections: Not on file  Intimate Partner Violence: Not on file      Review of Systems   Constitutional: negative for anorexia, fevers and sweats  Eyes: negative for irritation, redness and visual disturbance  Ears, nose, mouth, throat, and face: negative for earaches, epistaxis, nasal congestion and sore throat  Respiratory: negative for sputum and wheezing  Cardiovascular: negative for chest pain, lower extremity edema, orthopnea, palpitations and syncope  Gastrointestinal: negative for abdominal pain, constipation, diarrhea, melena, nausea and vomiting  Genitourinary:negative for dysuria, frequency and hematuria  Hematologic/lymphatic: negative for bleeding, easy bruising and lymphadenopathy  Musculoskeletal:negative for arthralgias, muscle weakness and stiff joints  Neurological: negative for coordination problems, gait problems, headaches and weakness  Endocrine: negative for diabetic symptoms including polydipsia, polyuria and weight loss     Objective:   Physical Exam  Gen. Pleasant, well-nourished, elderly,in no distress, normal affect ENT - no pallor,icterus, no post nasal drip Neck: No JVD, no thyromegaly, no carotid bruits Lungs: no use of accessory muscles, no dullness to percussion, bibasal dry rales no  rhonchi  Cardiovascular: Rhythm regular, heart sounds  normal, no murmurs or gallops, no peripheral edema Abdomen: soft  and non-tender, no hepatosplenomegaly, BS normal. Musculoskeletal: No deformities, no cyanosis or clubbing Neuro:  alert, non focal       Assessment & Plan:

## 2021-07-26 NOTE — Addendum Note (Signed)
Addended by: Fritzi Mandes D on: 07/26/2021 10:58 AM   Modules accepted: Orders

## 2021-07-26 NOTE — Patient Instructions (Addendum)
You have rattling at the bottom of the lungs CXR today - based on this we may need a CT scan Schedule pFTs Use albuterol as needed AMbulatory sat  Get records from Dr Marisue Ivan - PFTS, imaging

## 2021-07-26 NOTE — Assessment & Plan Note (Signed)
She brings an oxygen saturation record which shows that saturations have stayed about 95% over the last 2 months.  She does have bibasilar crackles on exam which raises the question of ILD or bronchiectasis. We need to clarify with a chest x-ray and follow-up with a high-resolution CT chest.  Based on this we will schedule PFTs if abnormality found. We will also obtain records from pulmonary practice in [place] regarding older imaging studies or PFTs if available.

## 2021-08-03 ENCOUNTER — Other Ambulatory Visit: Payer: Self-pay

## 2021-08-03 DIAGNOSIS — R0600 Dyspnea, unspecified: Secondary | ICD-10-CM

## 2021-08-03 DIAGNOSIS — R0609 Other forms of dyspnea: Secondary | ICD-10-CM

## 2021-08-03 DIAGNOSIS — R9389 Abnormal findings on diagnostic imaging of other specified body structures: Secondary | ICD-10-CM

## 2021-08-03 NOTE — Addendum Note (Signed)
Addended by: Elby Beck R on: 08/03/2021 04:02 PM   Modules accepted: Orders

## 2021-08-31 ENCOUNTER — Ambulatory Visit (HOSPITAL_COMMUNITY)
Admission: RE | Admit: 2021-08-31 | Discharge: 2021-08-31 | Disposition: A | Discharge status: Home / Self Care | Payer: Medicare Other | Source: Ambulatory Visit | Attending: Pulmonary Disease | Admitting: Pulmonary Disease

## 2021-08-31 ENCOUNTER — Other Ambulatory Visit: Payer: Self-pay

## 2021-08-31 DIAGNOSIS — R0609 Other forms of dyspnea: Secondary | ICD-10-CM | POA: Insufficient documentation

## 2021-08-31 DIAGNOSIS — R9389 Abnormal findings on diagnostic imaging of other specified body structures: Secondary | ICD-10-CM | POA: Diagnosis present

## 2021-10-08 ENCOUNTER — Encounter (INDEPENDENT_AMBULATORY_CARE_PROVIDER_SITE_OTHER): Payer: Self-pay

## 2021-10-09 ENCOUNTER — Ambulatory Visit (INDEPENDENT_AMBULATORY_CARE_PROVIDER_SITE_OTHER): Payer: Medicare Other | Admitting: Internal Medicine

## 2021-10-15 ENCOUNTER — Other Ambulatory Visit: Payer: Self-pay

## 2021-10-15 ENCOUNTER — Ambulatory Visit (INDEPENDENT_AMBULATORY_CARE_PROVIDER_SITE_OTHER): Payer: Medicare Other | Admitting: Internal Medicine

## 2021-10-15 ENCOUNTER — Encounter (INDEPENDENT_AMBULATORY_CARE_PROVIDER_SITE_OTHER): Payer: Self-pay | Admitting: Internal Medicine

## 2021-10-15 VITALS — BP 129/82 | HR 93 | Temp 97.5°F | Ht 68.0 in | Wt 182.4 lb

## 2021-10-15 DIAGNOSIS — K219 Gastro-esophageal reflux disease without esophagitis: Secondary | ICD-10-CM | POA: Diagnosis not present

## 2021-10-15 DIAGNOSIS — K5909 Other constipation: Secondary | ICD-10-CM | POA: Diagnosis not present

## 2021-10-15 NOTE — Progress Notes (Addendum)
Presenting complaint;  Follow for chronic GERD and constipation.  Database and subjective:  Patient is 70 year old Caucasian female who is here for scheduled visit.  She has chronic GERD.  She had EGD in July 2019 for persistent symptoms and dysphagia.  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  She also has small sliding hiatal hernia.  She also has a history of constipation.  Colonoscopy in July 2022 reveals 2 small polyps and these are hyperplastic.  She has been maintained on PPI along with H2B and Linzess.  Patient says she is doing well from GI standpoint.  She rarely experiences heartburn.  She denies nausea vomiting or dysphagia.  She is not having any side effects with lansoprazole or famotidine.  She is trying to increase dietary fiber.  She has been eating romaine salad along with other foods.  Bowels are moving every other day or every third day.  She says this is much better than what she was doing before.  She denies abdominal pain melena or rectal bleeding.  Her appetite is good.  She has gained 23 pounds since she was last seen in November 2021.  She states she has not been able to do much walking or physical activity due to hip and back problems.  She says her pain is improved 90% after she had pelvic manipulation done by her chiropractor but her back pain persisted and radiates into the right leg.  She says one of her disc is essentially gone.  She had 2 injections but this therapy did not help.  She is getting some relief with gabapentin.  She is trying to avoid surgery if she can.  Patient also brings a long copy of malignancy study which was performed on 03/06/2021.  T score was at right hip was -2.1 which translates to osteopenia.  No mention of prior study.  Follow-up was recommended pain 2 years.  Current Medications: Outpatient Encounter Medications as of 10/15/2021  Medication Sig   acetaminophen (TYLENOL) 500 MG tablet Take 1,000 mg by  mouth in the morning.   acyclovir (ZOVIRAX) 400 MG tablet Take 400 mg by mouth 3 (three) times daily as needed (for cold sores/fever blisters (takes for 7 days when needed)).   albuterol (PROVENTIL HFA;VENTOLIN HFA) 108 (90 Base) MCG/ACT inhaler Inhale 2 puffs into the lungs every 6 (six) hours as needed for wheezing or shortness of breath.    amLODipine (NORVASC) 5 MG tablet Take 5 mg by mouth in the morning.   Calcium Carb-Cholecalciferol (CALCIUM 600 + D PO) Take 2 tablets by mouth in the morning.   cycloSPORINE (RESTASIS) 0.05 % ophthalmic emulsion 1 drop 2 (two) times daily. One drop each eye twice daily   famotidine (PEPCID) 20 MG tablet TAKE 1 TABLET TWICE DAILY (Patient taking differently: 20 mg at bedtime.)   fluticasone (FLONASE) 50 MCG/ACT nasal spray Place 1-2 sprays into both nostrils daily as needed for allergies.   folic acid (FOLVITE) 1 MG tablet Take 1 mg by mouth in the morning.   gabapentin (NEURONTIN) 300 MG capsule Take 300 mg by mouth in the morning, at noon, and at bedtime.   hydrOXYzine (ATARAX/VISTARIL) 25 MG tablet Take 25 mg by mouth 3 (three) times daily as needed for itching.   lansoprazole (PREVACID) 30 MG capsule Take 1 capsule (30 mg total) by mouth daily. 30-45 minutes prior to eating breakfast in the mornings.   LINZESS 72 MCG capsule TAKE 1 CAPSULE EVERY DAY BEFORE BREAKFAST  methotrexate 2.5 MG tablet Take 10 mg by mouth every Wednesday.   MINERAL OIL PO Place 1 application into both ears. Dripper to ears as needed.   Multiple Vitamin (MULTIVITAMIN WITH MINERALS) TABS tablet Take 1 tablet by mouth in the morning.   Omega-3 Fatty Acids (FISH OIL) 1000 MG CAPS Take 1,000 mg by mouth in the morning.   silver sulfADIAZINE (SILVADENE) 1 % cream Apply 1 application topically daily as needed (foot wounds/sores).   Sodium Chloride-Sodium Bicarb (AYR SALINE NASAL RINSE NA) Place 1 application into the nose 2 (two) times daily as needed (allergies/nasal congestion.).    triamcinolone ointment (KENALOG) 0.5 % Apply 1 application topically 2 (two) times daily as needed (skin itching/irritation).   vitamin B-12 (CYANOCOBALAMIN) 1000 MCG tablet Take 1,000 mcg by mouth in the morning.   No facility-administered encounter medications on file as of 10/15/2021.     Objective: Blood pressure 129/82, pulse 93, temperature (!) 97.5 F (36.4 C), temperature source Oral, height 5\' 8"  (1.727 m), weight 182 lb 6.4 oz (82.7 kg). Patient is alert and in no acute distress. She is ambulating with a cane. Conjunctiva is pink. Sclera is nonicteric Oropharyngeal mucosa is normal. No neck masses or thyromegaly noted. Cardiac exam with regular rhythm normal S1 and S2. No murmur or gallop noted. Lungs are clear to auscultation. Abdomen is symmetrical soft and nontender with organomegaly or masses. No LE edema or clubbing noted.  Labs/studies Results:  Lab data from 09/13/2021  WBC 6.6, H&H 14.5 and 40.1, MCV 94 and platelet count 226K. BUN 9, creatinine 0.85 Glucose 80 Serum sodium 143, potassium 4.4, chloride 106, CO2 22 Serum calcium 9.6 Bilirubin 0.5, AP 110, AST 21, ALT 15, total protein 7.1 and albumin 4.4.  Bone density study results as above.   Assessment:  #1.  Chronic GERD.  EGD back in July 2019 revealed erosive reflux esophagitis, stricture at GE junction which was dilated and a small sliding hiatal hernia.  She is doing well with dietary measures PPI in the morning and famotidine in the evening.  She can start using famotidine on demand rather than on schedule.  #2.  Chronic constipation.  She is doing well with low-dose Linzess/linaclotide.  Last colonoscopy was in July 2022 revealing 2 small polyps and these are hyperplastic.  #3.  Weight gain she has gained 23 pounds since her last visit 1 year ago.  Weight gain appears to be due to significant decrease in physical activity because of back and hip pain.  She also was treated with steroids back in June  which may also have contributed to her weight gain.  Recent blood work did not include TSH.  If weight gain continues she may consider having TSH checked with her next blood work by Dr. Macarthur Critchley.   Plan:  Continue lansoprazole at a dose of 30 mg by mouth 30 to 45 minutes before breakfast daily. Use famotidine OTC 20 mg for breakthrough symptoms in the evening as needed. Continue Linzess/linaclotide at a dose of 72 mcg by mouth daily. Continue to increase dietary fiber as tolerated. Office visit in 6 months.

## 2021-10-15 NOTE — Patient Instructions (Signed)
Can use famotidine in the evening on an as-needed basis. Continue lansoprazole as before.

## 2021-10-18 ENCOUNTER — Encounter (INDEPENDENT_AMBULATORY_CARE_PROVIDER_SITE_OTHER): Payer: Self-pay

## 2021-10-24 ENCOUNTER — Ambulatory Visit (INDEPENDENT_AMBULATORY_CARE_PROVIDER_SITE_OTHER): Payer: Medicare Other | Admitting: Pulmonary Disease

## 2021-10-24 ENCOUNTER — Other Ambulatory Visit: Payer: Self-pay

## 2021-10-24 ENCOUNTER — Encounter: Payer: Self-pay | Admitting: Pulmonary Disease

## 2021-10-24 VITALS — BP 134/82 | HR 72 | Temp 98.2°F | Ht 68.0 in | Wt 182.1 lb

## 2021-10-24 DIAGNOSIS — R0609 Other forms of dyspnea: Secondary | ICD-10-CM | POA: Diagnosis not present

## 2021-10-24 DIAGNOSIS — J4520 Mild intermittent asthma, uncomplicated: Secondary | ICD-10-CM

## 2021-10-24 NOTE — Patient Instructions (Signed)
  Pulmonary rehab referral  PFTs

## 2021-10-24 NOTE — Assessment & Plan Note (Signed)
Appears to be mild intermittent at best and mainly triggered by allergies. Continue albuterol on a as needed basis and Flonase

## 2021-10-24 NOTE — Assessment & Plan Note (Signed)
Await PFTs but main issue here seems to be deconditioning.  I offered her pulmonary rehab program, she would be willing to start after tax season

## 2021-10-24 NOTE — Addendum Note (Signed)
Addended by: Fritzi Mandes D on: 10/24/2021 11:59 AM   Modules accepted: Orders

## 2021-10-24 NOTE — Progress Notes (Signed)
   Subjective:    Patient ID: Marie Calhoun, female    DOB: 07/11/1951, 70 y.o.   MRN: [id number]  HPI  70 yo never smoker for FU of dyspnea on exertion since May 2022  She was followed in [place] by Dr. Farris Has for many years for asthma and allergies, she quit taking allergy shots in 2018 when she went on methotrexate for subcutaneous lupus and since then has only been on Nasonex.   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   No more cough.  PFT has not been scheduled yet.     She continues to be limited by hip pain and neuropathy in her feet.  She developed a blood blister when she got new braces and this is now healing.  She went to a chiropractor and her hip felt a lot better after that treatment. She continues to complain of shortness of breath on minimal exertion but no wheezing or coughing.  Cough has significantly improved. We reviewed HRCT today.  PFTs have not been done. We have not received any records from Dr. Davina Poke yet  Significant tests/ events reviewed  HRCT chest 08/2021 Minimal, bland appearing scarring at the dependent bilateral lung bases.   Review of Systems neg for any significant sore throat, dysphagia, itching, sneezing, nasal congestion or excess/ purulent secretions, fever, chills, sweats, unintended wt loss, pleuritic or exertional cp, hempoptysis, orthopnea pnd or change in chronic leg swelling. Also denies presyncope, palpitations, heartburn, abdominal pain, nausea, vomiting, diarrhea or change in bowel or urinary habits, dysuria,hematuria, rash, arthralgias, visual complaints, headache, numbness weakness or ataxia.     Objective:   Physical Exam  Gen. Pleasant, elderly,well-nourished, in no distress ENT -  no thrush, no pallor/icterus,no post nasal drip Neck: No JVD, no thyromegaly, no carotid bruits Lungs: no use of accessory muscles, no dullness to percussion, clear without rales or rhonchi  Cardiovascular: Rhythm regular, heart sounds  normal, no murmurs or gallops, no peripheral edema Musculoskeletal: No deformities, no cyanosis or clubbing        Assessment & Plan:

## 2021-11-15 ENCOUNTER — Other Ambulatory Visit (INDEPENDENT_AMBULATORY_CARE_PROVIDER_SITE_OTHER): Payer: Self-pay | Admitting: Internal Medicine

## 2022-04-10 ENCOUNTER — Other Ambulatory Visit (INDEPENDENT_AMBULATORY_CARE_PROVIDER_SITE_OTHER): Payer: Self-pay | Admitting: Internal Medicine

## 2022-04-16 ENCOUNTER — Encounter (INDEPENDENT_AMBULATORY_CARE_PROVIDER_SITE_OTHER): Payer: Self-pay | Admitting: Internal Medicine

## 2022-04-16 ENCOUNTER — Ambulatory Visit (INDEPENDENT_AMBULATORY_CARE_PROVIDER_SITE_OTHER): Payer: Medicare Other | Admitting: Internal Medicine

## 2022-04-16 VITALS — BP 113/76 | HR 90 | Temp 99.0°F | Ht 68.0 in | Wt 169.0 lb

## 2022-04-16 DIAGNOSIS — K5909 Other constipation: Secondary | ICD-10-CM

## 2022-04-16 DIAGNOSIS — K222 Esophageal obstruction: Secondary | ICD-10-CM

## 2022-04-16 DIAGNOSIS — K2100 Gastro-esophageal reflux disease with esophagitis, without bleeding: Secondary | ICD-10-CM

## 2022-04-16 MED ORDER — LINACLOTIDE 72 MCG PO CAPS: ORAL_CAPSULE | ORAL | 3 refills | Status: DC

## 2022-04-16 NOTE — Progress Notes (Signed)
Presenting complaint;  Follow-up for chronic GERD and constipation. History of esophageal stricture.  Database and subjective:  Patient is 71 year old Caucasian female who is here for scheduled visit.  She was last seen on 10/15/2021.  She has chronic GERD complicated by esophageal stricture.  She underwent EGD in July 2019 which revealed grade B reflux esophagitis and stricture at GE junction which was dilated to 18 mm with a balloon. She also has chronic constipation.  Her last screening colonoscopy was in July 2022 with removal of 2 small polyps and these are hyperplastic.  Patient says she is doing well as present heartburn is concerned.  She feels it is well controlled with medication.  She takes famotidine in the evening on as-needed basis.  She has intermittent bloating without nausea vomiting or pain.  She denies dysphagia.  She generally has 3 bowel movements per week.  She denies melena or rectal bleeding.  She tries to eat fruits and vegetables on a daily basis.  She feels Linzess is helping.  She is a prescription. She says cystic lesion in right kidney on MRI done for other reasons.  She is scheduled for ultrasound of her kidneys next month.  She is not having hematuria. She remains with back pain.  She had injection for lower back about 3 weeks ago and it is helping. Patient has history of Charcot Lelan Pons tooth disease.  She is using right knee and ankle brace.    Current Medications: Outpatient Encounter Medications as of 04/16/2022  Medication Sig   acetaminophen (TYLENOL) 500 MG tablet Take 1,000 mg by mouth in the morning.   acyclovir (ZOVIRAX) 400 MG tablet Take 400 mg by mouth 3 (three) times daily as needed (for cold sores/fever blisters (takes for 7 days when needed)).   albuterol (PROVENTIL HFA;VENTOLIN HFA) 108 (90 Base) MCG/ACT inhaler Inhale 2 puffs into the lungs every 6 (six) hours as needed for wheezing or shortness of breath.    Calcium Carb-Cholecalciferol (CALCIUM  600 + D PO) Take 2 tablets by mouth in the morning.   cycloSPORINE (RESTASIS) 0.05 % ophthalmic emulsion 1 drop 2 (two) times daily. One drop each eye twice daily   famotidine (PEPCID) 20 MG tablet TAKE 1 TABLET TWICE DAILY   fluticasone (FLONASE) 50 MCG/ACT nasal spray Place 1-2 sprays into both nostrils daily as needed for allergies.   folic acid (FOLVITE) 1 MG tablet Take 1 mg by mouth in the morning.   gabapentin (NEURONTIN) 300 MG capsule Take 300 mg by mouth in the morning, at noon, and at bedtime.   hydrOXYzine (ATARAX/VISTARIL) 25 MG tablet Take 25 mg by mouth 3 (three) times daily as needed for itching.   ketoconazole (NIZORAL) 2 % cream Apply topically. Apply to nail and finger around affected area once daily   lansoprazole (PREVACID) 30 MG capsule TAKE 1 CAPSULE BY MOUTH ONCE DAILY ON AN EMPTY STOMACH, 30-45 MINUTES BEFORE BREAKFAST   LINZESS 72 MCG capsule TAKE 1 CAPSULE EVERY DAY BEFORE BREAKFAST   losartan-hydrochlorothiazide (HYZAAR) 50-12.5 MG tablet Take by mouth.   methotrexate 2.5 MG tablet Take 5 mg by mouth every Wednesday.   MINERAL OIL PO Place 1 application into both ears. Dripper to ears as needed.   Multiple Vitamin (MULTIVITAMIN WITH MINERALS) TABS tablet Take 1 tablet by mouth in the morning.   Omega-3 Fatty Acids (FISH OIL) 1000 MG CAPS Take 1,000 mg by mouth in the morning.   silver sulfADIAZINE (SILVADENE) 1 % cream Apply 1 application topically daily  as needed (foot wounds/sores).   Sodium Chloride-Sodium Bicarb (AYR SALINE NASAL RINSE NA) Place 1 application into the nose 2 (two) times daily as needed (allergies/nasal congestion.).   Terbinafine HCl (LAMISIL PO) Take by mouth. Cream on nails once daily   triamcinolone ointment (KENALOG) 0.5 % Apply 1 application topically 2 (two) times daily as needed (skin itching/irritation).   vitamin B-12 (CYANOCOBALAMIN) 1000 MCG tablet Take 2,000 mcg by mouth in the morning. Takes 2 tablets ( 2,000 total daily    [DISCONTINUED] amLODipine (NORVASC) 5 MG tablet Take 5 mg by mouth in the morning.   No facility-administered encounter medications on file as of 04/16/2022.     Objective: Blood pressure 113/76, pulse 90, temperature 99 F (37.2 C), temperature source Oral, height '5\' 8"'$  (1.727 m), weight 169 lb (76.7 kg). Patient is alert and in no acute distress. Conjunctiva is pink. Sclera is nonicteric Oropharyngeal mucosa is normal. She has partial upper and lower dental plates. No neck masses or thyromegaly noted. Cardiac exam with regular rhythm normal S1 and S2. No murmur or gallop noted. Lungs are clear to auscultation. Abdomen is soft and nontender with organomegaly or masses. No LE edema or clubbing noted. She has right knee brace as well as ankle braces.   Assessment:  #1.  Chronic GERD.  She has history of erosive esophagitis complicated by esophageal stricture. Heartburn is well controlled with therapy.  She will continue lansoprazole 30 mg every morning and famotidine in the evening for breakthrough symptoms on as-needed basis.  #2.  History of esophageal stricture.  Stricture was last dilated in July 2019.  She is not having any symptoms suggest recurrence of stricture.  #3.  Chronic constipation.  Constipation possibly related to her medications as well as Charcot-Marie-Tooth disease resulting in diminished physical activity.  She is up-to-date on screening for CRC as above.  She is doing well with low-dose Linzess/linaclotide.  Plan:  Continue Linzess/linaclotide at a dose of 72 mcg p.o. daily.  New prescription given for 90 doses with 3 refills. Continue lansoprazole at 30 mg p.o. every morning.  Prescription given for 90 doses with 3 refills. Patient will call office if lansoprazole stops working or if he has dysphagia. Office visit in 1 year.

## 2022-04-16 NOTE — Patient Instructions (Signed)
Notify if lansoprazole stops working or if you have swallowing difficulty.

## 2022-04-19 ENCOUNTER — Ambulatory Visit (INDEPENDENT_AMBULATORY_CARE_PROVIDER_SITE_OTHER): Payer: Medicare Other | Admitting: Urology

## 2022-04-19 ENCOUNTER — Encounter: Payer: Self-pay | Admitting: Urology

## 2022-04-19 VITALS — BP 136/80 | HR 101

## 2022-04-19 DIAGNOSIS — N2889 Other specified disorders of kidney and ureter: Secondary | ICD-10-CM | POA: Diagnosis not present

## 2022-04-19 LAB — URINALYSIS, ROUTINE W REFLEX MICROSCOPIC
Bilirubin, UA: NEGATIVE
Glucose, UA: NEGATIVE
Ketones, UA: NEGATIVE
Leukocytes,UA: NEGATIVE
Nitrite, UA: NEGATIVE
Protein,UA: NEGATIVE
RBC, UA: NEGATIVE
Specific Gravity, UA: 1.015 (ref 1.005–1.030)
Urobilinogen, Ur: 0.2 mg/dL (ref 0.2–1.0)
pH, UA: 5.5 (ref 5.0–7.5)

## 2022-04-19 NOTE — Progress Notes (Signed)
04/19/2022 11:29 AM   Marie Calhoun November 16, 1951 [id number]  Referring provider: Earney Mallet, MD [address] [place] [address]  Right renal cysts  HPI: [name] is a 89RD here for evaluation of a right renal cyst. MRI 02/2014 shows a 6.3cm right renal cyst. MRI report from 2022 states the cyst has enlarged but no size was given. She has GERD. No flank pain.    PMH: Past Medical History:  Diagnosis Date   Anxiety    Arthritis    Asthma 05/28/2017   Charcot-Marie-Tooth disease    Depression    Environmental allergies 05/28/2017   Esophageal stricture 05/28/2017   Essential hypertension, benign 05/28/2017   GERD (gastroesophageal reflux disease) 05/28/2017   Hypercholesteremia    Lupus (HCC)    of skin   PONV (postoperative nausea and vomiting)    Seasonal allergies     Surgical History: Past Surgical History:  Procedure Laterality Date   breast tumor\     left benign   CHOLECYSTECTOMY     COLONOSCOPY WITH PROPOFOL N/A 05/30/2021   Procedure: COLONOSCOPY WITH PROPOFOL;  Surgeon: Rogene Houston, MD;  [facility];  Service: Endoscopy;  Laterality: N/A;  [place] N/A 09/11/2017   Procedure: ESOPHAGEAL DILATION;  Surgeon: Rogene Houston, MD;  [facility];  Service: Endoscopy;  Laterality: N/A;   ESOPHAGEAL DILATION N/A 06/17/2018   Procedure: ESOPHAGEAL DILATION;  Surgeon: Rogene Houston, MD;  [facility];  Service: Endoscopy;  Laterality: N/A;   ESOPHAGOGASTRODUODENOSCOPY N/A 09/11/2017   Procedure: ESOPHAGOGASTRODUODENOSCOPY (EGD);  Surgeon: Rogene Houston, MD;  [facility];  Service: Endoscopy;  Laterality: N/A;  1:40 - Can NOT come earlier   ESOPHAGOGASTRODUODENOSCOPY N/A 06/17/2018   Procedure: ESOPHAGOGASTRODUODENOSCOPY (EGD);  Surgeon: Rogene Houston, MD;  [facility];  Service: Endoscopy;  Laterality: N/A;  10:30   hand tendon release Left    knee surgeries     [place]   POLYPECTOMY  05/30/2021   Procedure: POLYPECTOMY;  Surgeon: Rogene Houston, MD;  [facility];  Service: Endoscopy;;    Home Medications:  Allergies as of 04/19/2022       Reactions   Doxycycline Itching, Rash   Demerol [meperidine] Nausea Only   nausea   Other    Not able to take aspirin, ibuprofen, and inflammatory meds due to ulcers in stomach   Pantoprazole    Subacute Cutaneous Lupus of the Skin   Plaquenil [hydroxychloroquine]    Caused another Rash on top of Lupus Rash   Sulfa Antibiotics Swelling   *Childhood*   Ranitidine Rash        Medication List        Accurate as of April 19, 2022 11:29 AM. If you have any questions, ask your nurse or doctor.          acetaminophen 500 MG tablet Commonly known as: TYLENOL Take 1,000 mg by mouth in the morning.   acyclovir 400 MG tablet Commonly known as: ZOVIRAX Take 400 mg by mouth 3 (three) times daily as needed (for cold sores/fever blisters (takes for 7 days when needed)).   albuterol 108 (90 Base) MCG/ACT inhaler Commonly known as: VENTOLIN HFA Inhale 2 puffs into the lungs every 6 (six) hours as needed for wheezing or shortness of breath.   AYR SALINE NASAL  RINSE NA Place 1 application into the nose 2 (two) times daily as needed (allergies/nasal congestion.).   CALCIUM 600 + D PO Take 2 tablets by mouth in the morning.   cycloSPORINE 0.05 % ophthalmic emulsion Commonly known as: RESTASIS 1 drop 2 (two) times daily. One drop each eye twice daily   famotidine 20 MG tablet Commonly known as: PEPCID TAKE 1 TABLET TWICE DAILY What changed:  when to take this reasons to take this   Fish Oil 1000 MG Caps Take 1,000 mg by mouth in the morning.   fluticasone 50 MCG/ACT nasal spray Commonly known as: FLONASE Place 1-2 sprays into both nostrils daily as needed for allergies.   folic acid 1 MG tablet Commonly known as:  FOLVITE Take 1 mg by mouth in the morning.   gabapentin 300 MG capsule Commonly known as: NEURONTIN Take 300 mg by mouth in the morning, at noon, and at bedtime.   hydrOXYzine 25 MG tablet Commonly known as: ATARAX Take 25 mg by mouth 3 (three) times daily as needed for itching.   ketoconazole 2 % cream Commonly known as: NIZORAL Apply topically. Apply to nail and finger around affected area once daily   LAMISIL PO Take by mouth. Cream on nails once daily   lansoprazole 30 MG capsule Commonly known as: PREVACID TAKE 1 CAPSULE BY MOUTH ONCE DAILY ON AN EMPTY STOMACH, 30-45 MINUTES BEFORE BREAKFAST   linaclotide 72 MCG capsule Commonly known as: Linzess TAKE 1 CAPSULE EVERY DAY BEFORE BREAKFAST   losartan-hydrochlorothiazide 50-12.5 MG tablet Commonly known as: HYZAAR Take by mouth.   methotrexate 2.5 MG tablet Take 5 mg by mouth every Wednesday.   multivitamin with minerals Tabs tablet Take 1 tablet by mouth in the morning.   silver sulfADIAZINE 1 % cream Commonly known as: SILVADENE Apply 1 application topically daily as needed (foot wounds/sores).   triamcinolone ointment 0.5 % Commonly known as: KENALOG Apply 1 application topically 2 (two) times daily as needed (skin itching/irritation).   vitamin B-12 1000 MCG tablet Commonly known as: CYANOCOBALAMIN Take 2,000 mcg by mouth in the morning. Takes 2 tablets ( 2,000 total daily        Allergies:  Allergies  Allergen Reactions   Doxycycline Itching and Rash   Demerol [Meperidine] Nausea Only    nausea   Other     Not able to take aspirin, ibuprofen, and inflammatory meds due to ulcers in stomach   Pantoprazole     Subacute Cutaneous Lupus of the Skin   Plaquenil [Hydroxychloroquine]     Caused another Rash on top of Lupus Rash   Sulfa Antibiotics Swelling    *Childhood*   Ranitidine Rash    Family History: No family history on file.  Social History:  reports that she has never smoked. She has  never been exposed to tobacco smoke. She has never used smokeless tobacco. She reports that she does not drink alcohol and does not use drugs.  ROS: All other review of systems were reviewed and are negative except what is noted above in HPI  Physical Exam: BP 136/80   Pulse (!) 101   Constitutional:  Alert and oriented, No acute distress. HEENT: [place] AT, moist mucus membranes.  Trachea midline, no masses. Cardiovascular: No clubbing, cyanosis, or edema. Respiratory: Normal respiratory effort, no increased work of breathing. GI: Abdomen is soft, nontender, nondistended, no abdominal masses GU: No CVA tenderness.  Lymph: No cervical or inguinal lymphadenopathy. Skin: No rashes, bruises or suspicious lesions.  Neurologic: Grossly intact, no focal deficits, moving all 4 extremities. Psychiatric: Normal mood and affect.  Laboratory Data: No results found for: WBC, HGB, HCT, MCV, PLT  No results found for: CREATININE  No results found for: PSA  No results found for: TESTOSTERONE  No results found for: HGBA1C  Urinalysis No results found for: COLORURINE, APPEARANCEUR, LABSPEC, PHURINE, GLUCOSEU, HGBUR, BILIRUBINUR, KETONESUR, PROTEINUR, UROBILINOGEN, NITRITE, LEUKOCYTESUR  No results found for: LABMICR, [place], RBCUA, LABEPIT, MUCUS, BACTERIA  Pertinent Imaging:  No results found for this or any previous visit.  No results found for this or any previous visit.  No results found for this or any previous visit.  No results found for this or any previous visit.  No results found for this or any previous visit.  No results found for this or any previous visit.  No results found for this or any previous visit.  No results found for this or any previous visit.   Assessment & Plan:    1. Kidney mass -Renal US  - Urinalysis, Routine w reflex microscopic   No follow-ups on file.  Nicolette Bang, MD  [facility] [place]

## 2022-04-19 NOTE — Patient Instructions (Signed)

## 2022-04-22 ENCOUNTER — Encounter (INDEPENDENT_AMBULATORY_CARE_PROVIDER_SITE_OTHER): Payer: Self-pay | Admitting: Internal Medicine

## 2022-04-28 IMAGING — CT CT CHEST HIGH RESOLUTION W/O CM
2 of 5 series · 15 of 36 positions shown, 18 images · non-contrast
Comparison: None.

CLINICAL DATA: Dyspnea on exertion, abnormal chest radiograph

EXAM:
CT CHEST WITHOUT CONTRAST
TECHNIQUE: Multidetector CT imaging of the chest was performed following the
standard protocol without intravenous contrast. High resolution
imaging of the lungs, as well as inspiratory and expiratory imaging,
was performed.

[Series 3: standard chest · axial · 0.69mm/px · z∈[+1302,+1556]mm · 12 of 141 slices shown, 15 images]
[im 7/141  mediastinal]
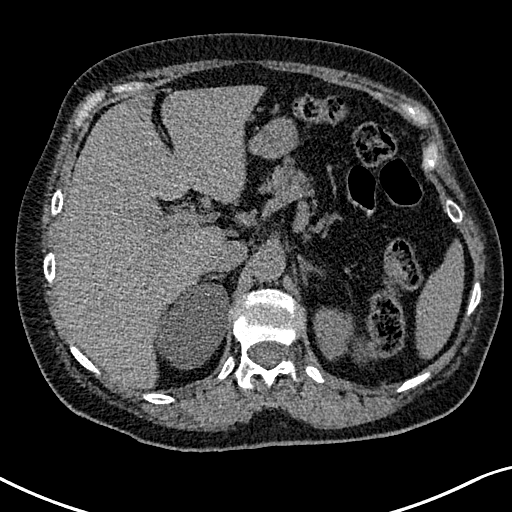
[im 7/141  lung]
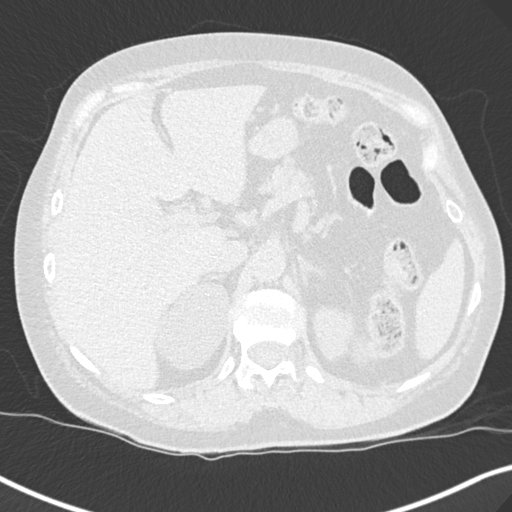
[im 19/141  lung]
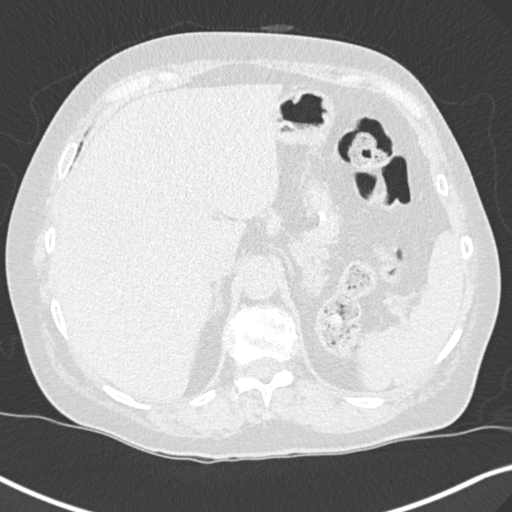
[im 31/141  lung]
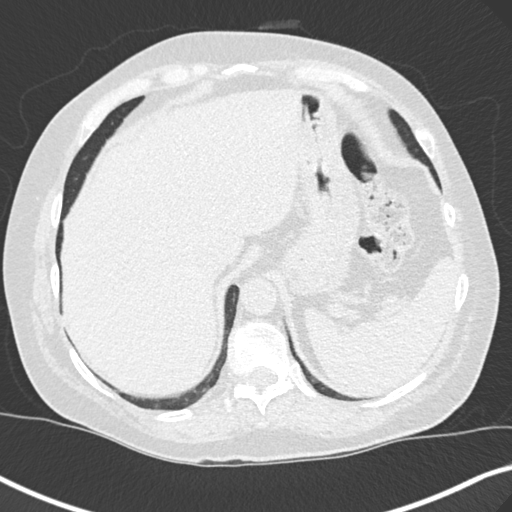
[im 43/141  lung]
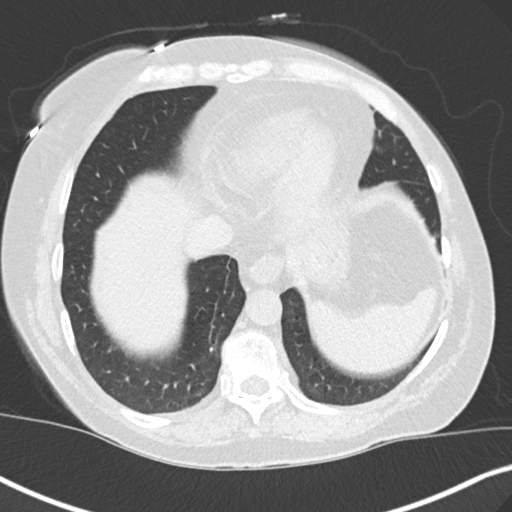
[im 55/141  mediastinal]
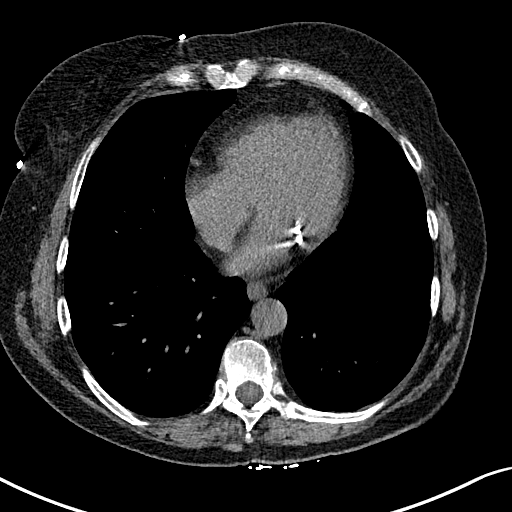
[im 55/141  lung]
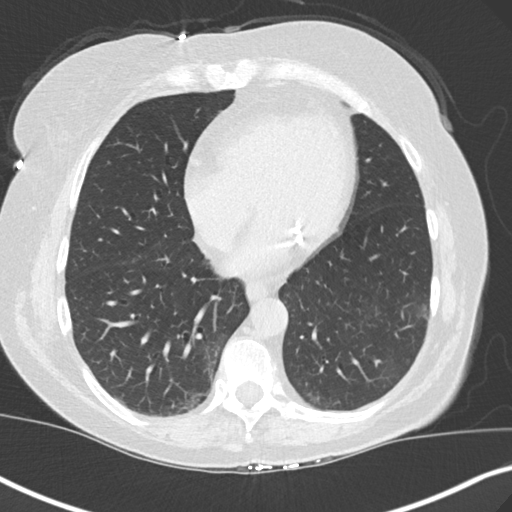
[im 67/141  lung]
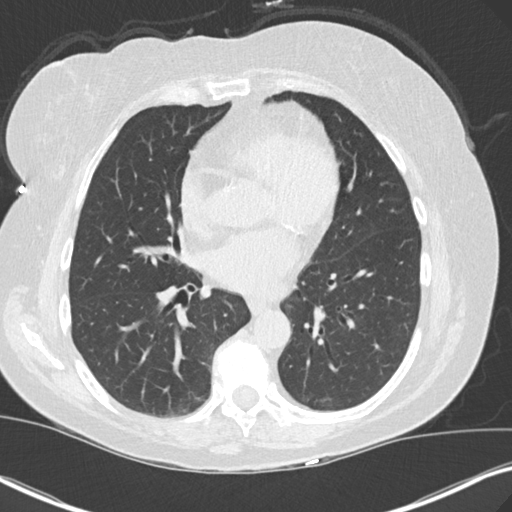
[im 74/141  lung]
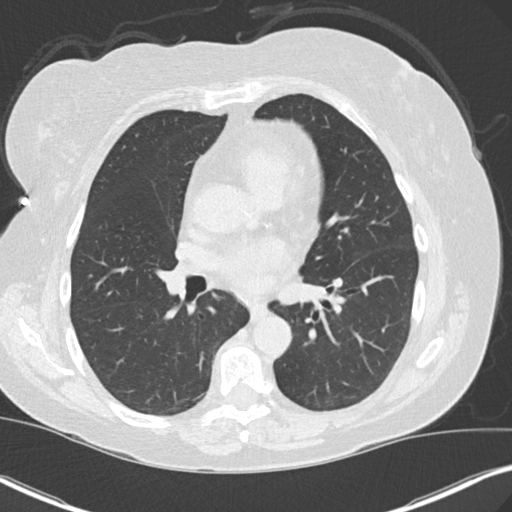
[im 86/141  lung]
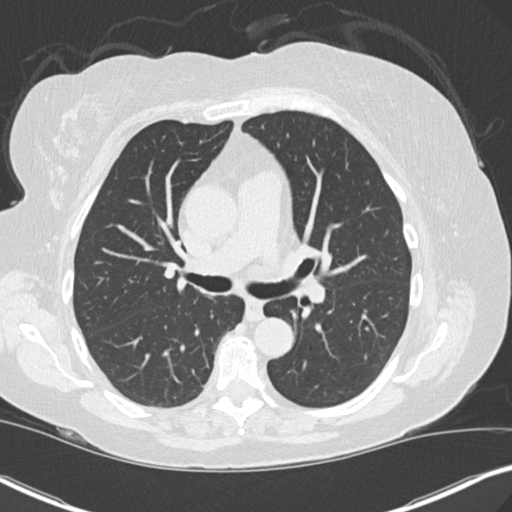
[im 98/141  mediastinal]
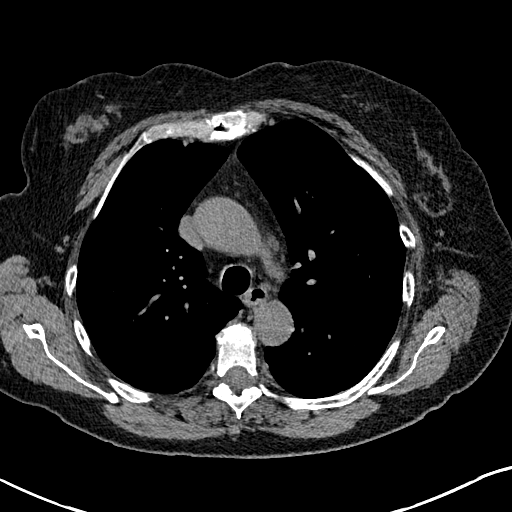
[im 98/141  lung]
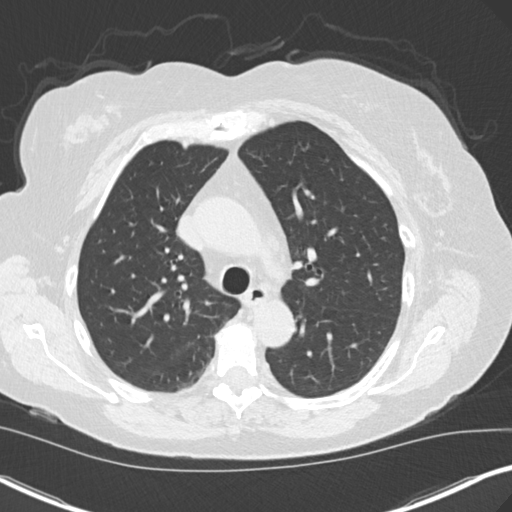
[im 110/141  lung]
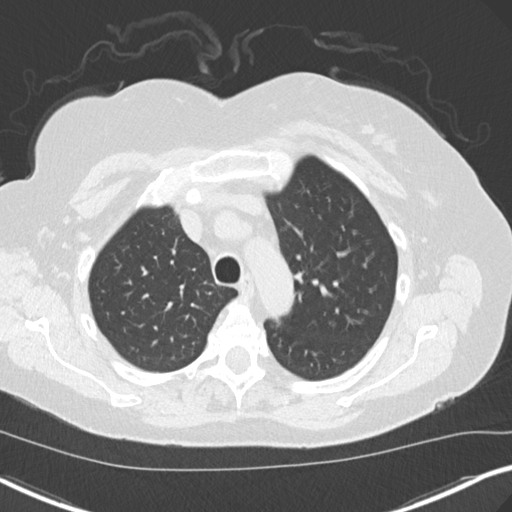
[im 122/141  lung]
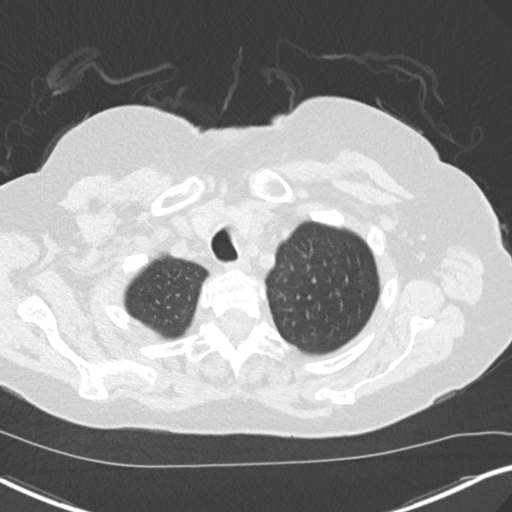
[im 134/141  lung]
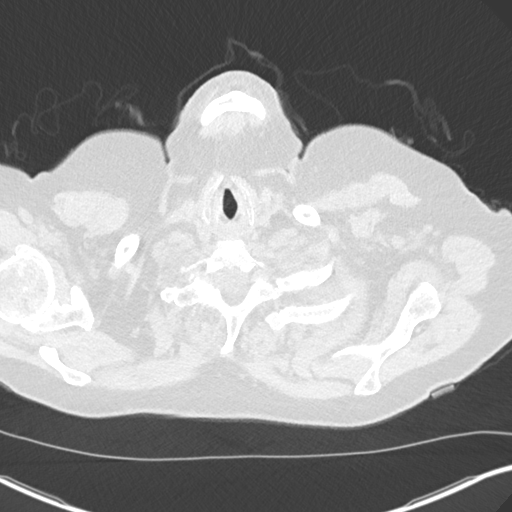

[Series 7: coronal · coronal · 0.58mm/px · 3 of 158 slices shown]
[im 32/158  lung]
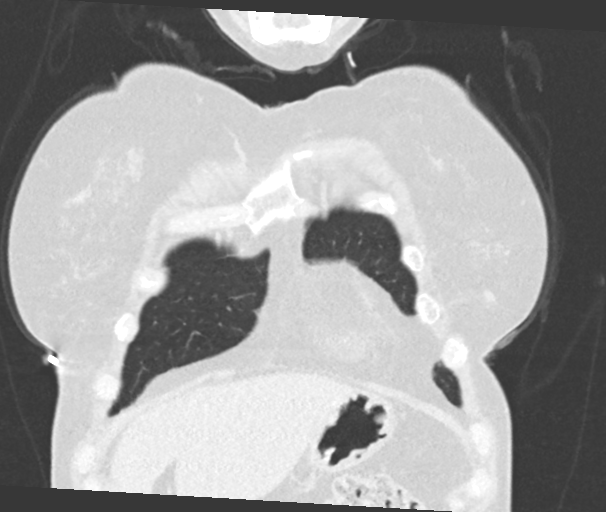
[im 63/158  lung]
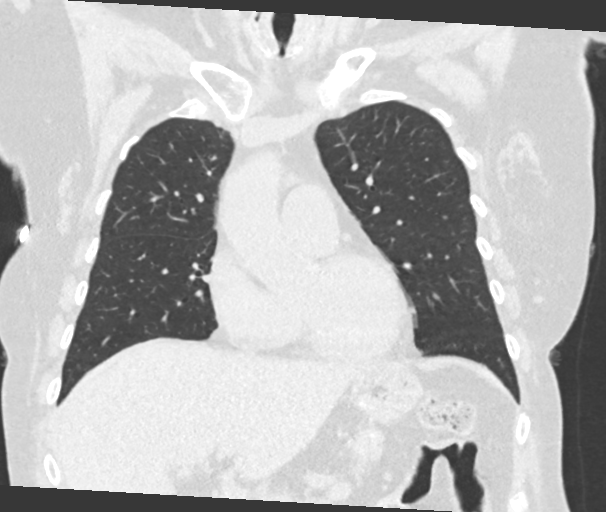
[im 95/158  lung]
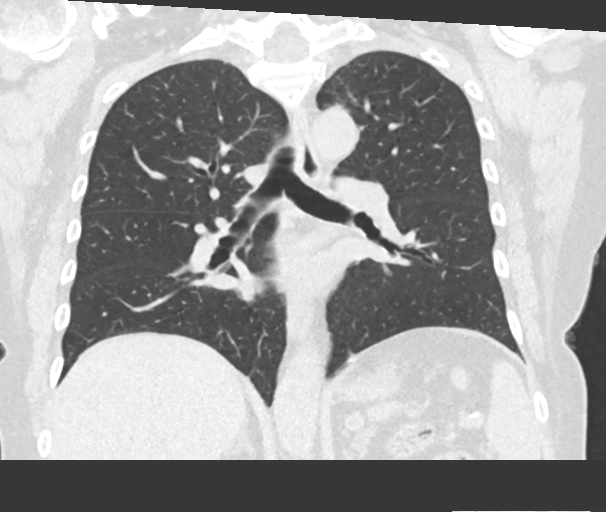

[15 of 36 positions shown; findings below may reference images not displayed]

FINDINGS: Cardiovascular: Aortic atherosclerosis. Normal heart size. Left
coronary artery calcifications. No pericardial effusion.

Mediastinum/Nodes: No enlarged mediastinal, hilar, or axillary lymph
nodes. Thyroid gland, trachea, and esophagus demonstrate no
significant findings.

Lungs/Pleura: Minimal, bland appearing scarring at the dependent
bilateral lung bases (series 6, image 172). No significant air
trapping on expiratory phase imaging. No pleural effusion or
pneumothorax.

Upper Abdomen: No acute abnormality.

Musculoskeletal: No chest wall mass or suspicious bone lesions
identified.
IMPRESSION: 1. Minimal, bland appearing scarring at the dependent bilateral lung
bases. No evidence of fibrotic interstitial lung disease.
2. No significant air trapping on expiratory phase imaging.
3. Coronary artery disease.

Aortic Atherosclerosis ([id number].V).

## 2022-04-29 ENCOUNTER — Ambulatory Visit (HOSPITAL_COMMUNITY)
Admission: RE | Admit: 2022-04-29 | Discharge: 2022-04-29 | Disposition: A | Discharge status: Home / Self Care | Payer: Medicare Other | Source: Ambulatory Visit | Attending: Urology | Admitting: Urology

## 2022-04-29 DIAGNOSIS — N2889 Other specified disorders of kidney and ureter: Secondary | ICD-10-CM | POA: Insufficient documentation

## 2022-05-08 ENCOUNTER — Encounter: Payer: Self-pay | Admitting: Urology

## 2022-05-08 ENCOUNTER — Ambulatory Visit (INDEPENDENT_AMBULATORY_CARE_PROVIDER_SITE_OTHER): Payer: Medicare Other | Admitting: Urology

## 2022-05-08 VITALS — BP 145/85 | HR 85

## 2022-05-08 DIAGNOSIS — N2889 Other specified disorders of kidney and ureter: Secondary | ICD-10-CM

## 2022-05-08 LAB — URINALYSIS, ROUTINE W REFLEX MICROSCOPIC
Bilirubin, UA: NEGATIVE
Glucose, UA: NEGATIVE
Ketones, UA: NEGATIVE
Leukocytes,UA: NEGATIVE
Nitrite, UA: NEGATIVE
Protein,UA: NEGATIVE
RBC, UA: NEGATIVE
Specific Gravity, UA: 1.02 (ref 1.005–1.030)
Urobilinogen, Ur: 0.2 mg/dL (ref 0.2–1.0)
pH, UA: 5.5 (ref 5.0–7.5)

## 2022-05-08 NOTE — Progress Notes (Signed)
05/08/2022 10:00 AM   Marie Calhoun 25-Apr-1951 [id number]  Referring provider: Earney Mallet, MD [address] [place] [address]  Followup right renal cyst   HPI: [name] is a 76QV here for followup for right renal cyst. Renal US 05/03/2022 shows a 6cm right renal cyst which in minimally complex. No flank pain. She has occasional urge incontinence which does not bother her. No hematuria.    PMH: Past Medical History:  Diagnosis Date   Anxiety    Arthritis    Asthma 05/28/2017   Charcot-Marie-Tooth disease    Depression    Environmental allergies 05/28/2017   Esophageal stricture 05/28/2017   Essential hypertension, benign 05/28/2017   GERD (gastroesophageal reflux disease) 05/28/2017   Hypercholesteremia    Lupus (HCC)    of skin   PONV (postoperative nausea and vomiting)    Seasonal allergies     Surgical History: Past Surgical History:  Procedure Laterality Date   breast tumor\     left benign   CHOLECYSTECTOMY     COLONOSCOPY WITH PROPOFOL N/A 05/30/2021   Procedure: COLONOSCOPY WITH PROPOFOL;  Surgeon: Rogene Houston, MD;  [facility];  Service: Endoscopy;  Laterality: N/A;  [place] N/A 09/11/2017   Procedure: ESOPHAGEAL DILATION;  Surgeon: Rogene Houston, MD;  [facility];  Service: Endoscopy;  Laterality: N/A;   ESOPHAGEAL DILATION N/A 06/17/2018   Procedure: ESOPHAGEAL DILATION;  Surgeon: Rogene Houston, MD;  [facility];  Service: Endoscopy;  Laterality: N/A;   ESOPHAGOGASTRODUODENOSCOPY N/A 09/11/2017   Procedure: ESOPHAGOGASTRODUODENOSCOPY (EGD);  Surgeon: Rogene Houston, MD;  [facility];  Service: Endoscopy;  Laterality: N/A;  1:40 - Can NOT come earlier   ESOPHAGOGASTRODUODENOSCOPY N/A 06/17/2018   Procedure: ESOPHAGOGASTRODUODENOSCOPY (EGD);  Surgeon: Rogene Houston, MD;  [facility];  Service: Endoscopy;  Laterality: N/A;  10:30   hand tendon release Left     knee surgeries     [place]   POLYPECTOMY  05/30/2021   Procedure: POLYPECTOMY;  Surgeon: Rogene Houston, MD;  [facility];  Service: Endoscopy;;    Home Medications:  Allergies as of 05/08/2022       Reactions   Doxycycline Itching, Rash   Demerol [meperidine] Nausea Only   nausea   Other    Not able to take aspirin, ibuprofen, and inflammatory meds due to ulcers in stomach   Pantoprazole    Subacute Cutaneous Lupus of the Skin   Plaquenil [hydroxychloroquine]    Caused another Rash on top of Lupus Rash   Sulfa Antibiotics Swelling   *Childhood*   Ranitidine Rash        Medication List        Accurate as of May 08, 2022 10:00 AM. If you have any questions, ask your nurse or doctor.          acetaminophen 500 MG tablet Commonly known as: TYLENOL Take 1,000 mg by mouth in the morning.   acyclovir 400 MG tablet Commonly known as: ZOVIRAX Take 400 mg by mouth 3 (three) times daily as needed (for cold sores/fever blisters (takes for 7 days when needed)).   albuterol 108 (90 Base) MCG/ACT inhaler Commonly known as: VENTOLIN HFA Inhale 2 puffs into the lungs every 6 (six) hours as needed for wheezing or shortness of breath.   AYR SALINE  NASAL RINSE NA Place 1 application into the nose 2 (two) times daily as needed (allergies/nasal congestion.).   CALCIUM 600 + D PO Take 2 tablets by mouth in the morning.   cycloSPORINE 0.05 % ophthalmic emulsion Commonly known as: RESTASIS 1 drop 2 (two) times daily. One drop each eye twice daily   famotidine 20 MG tablet Commonly known as: PEPCID TAKE 1 TABLET TWICE DAILY What changed:  when to take this reasons to take this   Fish Oil 1000 MG Caps Take 1,000 mg by mouth in the morning.   fluticasone 50 MCG/ACT nasal spray Commonly known as: FLONASE Place 1-2 sprays into both nostrils daily as needed for allergies.   folic acid 1 MG  tablet Commonly known as: FOLVITE Take 1 mg by mouth in the morning.   gabapentin 300 MG capsule Commonly known as: NEURONTIN Take 300 mg by mouth in the morning, at noon, and at bedtime.   hydrOXYzine 25 MG tablet Commonly known as: ATARAX Take 25 mg by mouth 3 (three) times daily as needed for itching.   ketoconazole 2 % cream Commonly known as: NIZORAL Apply topically. Apply to nail and finger around affected area once daily   LAMISIL PO Take by mouth. Cream on nails once daily   lansoprazole 30 MG capsule Commonly known as: PREVACID TAKE 1 CAPSULE BY MOUTH ONCE DAILY ON AN EMPTY STOMACH, 30-45 MINUTES BEFORE BREAKFAST   linaclotide 72 MCG capsule Commonly known as: Linzess TAKE 1 CAPSULE EVERY DAY BEFORE BREAKFAST   losartan-hydrochlorothiazide 50-12.5 MG tablet Commonly known as: HYZAAR Take by mouth.   methotrexate 2.5 MG tablet Take 5 mg by mouth every Wednesday.   multivitamin with minerals Tabs tablet Take 1 tablet by mouth in the morning.   silver sulfADIAZINE 1 % cream Commonly known as: SILVADENE Apply 1 application topically daily as needed (foot wounds/sores).   triamcinolone ointment 0.5 % Commonly known as: KENALOG Apply 1 application topically 2 (two) times daily as needed (skin itching/irritation).   vitamin B-12 1000 MCG tablet Commonly known as: CYANOCOBALAMIN Take 2,000 mcg by mouth in the morning. Takes 2 tablets ( 2,000 total daily        Allergies:  Allergies  Allergen Reactions   Doxycycline Itching and Rash   Demerol [Meperidine] Nausea Only    nausea   Other     Not able to take aspirin, ibuprofen, and inflammatory meds due to ulcers in stomach   Pantoprazole     Subacute Cutaneous Lupus of the Skin   Plaquenil [Hydroxychloroquine]     Caused another Rash on top of Lupus Rash   Sulfa Antibiotics Swelling    *Childhood*   Ranitidine Rash    Family History: No family history on file.  Social History:  reports that she  has never smoked. She has never been exposed to tobacco smoke. She has never used smokeless tobacco. She reports that she does not drink alcohol and does not use drugs.  ROS: All other review of systems were reviewed and are negative except what is noted above in HPI  Physical Exam: BP (!) 145/85   Pulse 85   Constitutional:  Alert and oriented, No acute distress. HEENT: [place] AT, moist mucus membranes.  Trachea midline, no masses. Cardiovascular: No clubbing, cyanosis, or edema. Respiratory: Normal respiratory effort, no increased work of breathing. GI: Abdomen is soft, nontender, nondistended, no abdominal masses GU: No CVA tenderness.  Lymph: No cervical or inguinal lymphadenopathy. Skin: No rashes, bruises or suspicious  lesions. Neurologic: Grossly intact, no focal deficits, moving all 4 extremities. Psychiatric: Normal mood and affect.  Laboratory Data: No results found for: "WBC", "HGB", "HCT", "MCV", "PLT"  No results found for: "CREATININE"  No results found for: "PSA"  No results found for: "TESTOSTERONE"  No results found for: "HGBA1C"  Urinalysis    Component Value Date/Time   APPEARANCEUR Clear 04/19/2022 1108   GLUCOSEU Negative 04/19/2022 1108   BILIRUBINUR Negative 04/19/2022 1108   PROTEINUR Negative 04/19/2022 1108   NITRITE Negative 04/19/2022 1108   LEUKOCYTESUR Negative 04/19/2022 1108    Lab Results  Component Value Date   LABMICR Comment 04/19/2022    Pertinent Imaging: Renal US 05/03/2022: Images reviewed and discussed with the patient  No results found for this or any previous visit.  No results found for this or any previous visit.  No results found for this or any previous visit.  No results found for this or any previous visit.  Results for orders placed in visit on 04/19/22  Ultrasound renal complete  Narrative CLINICAL DATA:  Renal cyst on MRI 2022 at [facility] Hypertension.  EXAM: RENAL / URINARY TRACT ULTRASOUND  COMPLETE  COMPARISON:  None Available.  FINDINGS: Right Kidney:  Renal measurements: 10.9 x 4.3 x 5.3 cm = volume: 130 mL. Echogenicity within normal limits. Avascular anechoic cystic lesion measuring 6 x 5.1 x 4.1 cm along the superior pole of the right kidney. Query associated nonvascular mural nodularity on image 48 of 127. No solid mass or hydronephrosis visualized.  Left Kidney:  Renal measurements: 10.3 x 5.2 x 4.8 cm = volume: 135 mL. Echogenicity within normal limits. No mass or hydronephrosis visualized.  Urinary bladder:  Appears normal for degree of bladder distention.  Other:  None.  IMPRESSION: A minimally complex 6 cm right renal cyst. Recommend correlation with prior cross-sectional imaging to evaluate for stability in size.   Electronically Signed By: Iven Finn M.D. On: 05/03/2022 16:35  No results found for this or any previous visit.  No results found for this or any previous visit.  No results found for this or any previous visit.   Assessment & Plan:    1. Kidney mass -RTC 1 year with renal US - Urinalysis, Routine w reflex microscopic   No follow-ups on file.  Nicolette Bang, MD  [facility] [place]

## 2022-05-08 NOTE — Patient Instructions (Signed)

## 2022-05-13 ENCOUNTER — Other Ambulatory Visit: Payer: Self-pay

## 2022-05-13 ENCOUNTER — Encounter: Payer: Self-pay | Admitting: Pulmonary Disease

## 2022-05-13 ENCOUNTER — Ambulatory Visit: Payer: Self-pay | Admitting: Student

## 2022-05-13 ENCOUNTER — Ambulatory Visit (INDEPENDENT_AMBULATORY_CARE_PROVIDER_SITE_OTHER): Payer: Medicare Other | Admitting: Pulmonary Disease

## 2022-05-13 DIAGNOSIS — Z9109 Other allergy status, other than to drugs and biological substances: Secondary | ICD-10-CM | POA: Diagnosis not present

## 2022-05-13 DIAGNOSIS — J4520 Mild intermittent asthma, uncomplicated: Secondary | ICD-10-CM | POA: Diagnosis not present

## 2022-05-13 MED ORDER — FLUTICASONE PROPIONATE 50 MCG/ACT NA SUSP: 1.0000 | Freq: Every day | NASAL | 3 refills | Status: DC | PRN

## 2022-05-13 MED ORDER — ALBUTEROL SULFATE HFA 108 (90 BASE) MCG/ACT IN AERS: 2.0000 | INHALATION_SPRAY | Freq: Four times a day (QID) | RESPIRATORY_TRACT | 3 refills | Status: DC | PRN

## 2022-05-20 NOTE — Patient Instructions (Addendum)
DUE TO COVID-19 ONLY TWO VISITORS  (aged 69 and older)  ARE ALLOWED TO COME WITH YOU AND STAY IN THE WAITING ROOM ONLY DURING PRE OP AND PROCEDURE.   **NO VISITORS ARE ALLOWED IN THE SHORT STAY AREA OR RECOVERY ROOM!!**  IF YOU WILL BE ADMITTED INTO THE HOSPITAL YOU ARE ALLOWED ONLY FOUR SUPPORT PEOPLE DURING VISITATION HOURS ONLY (7 AM -8PM)   The support person(s) must pass our screening, gel in and out, and wear a mask at all times, including in the patient's room. Patients must also wear a mask when staff or their support person are in the room. Visitors GUEST BADGE MUST BE WORN VISIBLY  One adult visitor may remain with you overnight and MUST be in the room by 8 P.M.     Your procedure is scheduled on: 05/30/22   Report to [facility] Main Entrance    Report to admitting at : 8:00 AM   Call this number if you have problems the morning of surgery [phone]   Do not eat food :After Midnight.   After Midnight you may have the following liquids until : 7:30 AM/ DAY OF SURGERY  Water Black Coffee (sugar ok, NO MILK/CREAM OR CREAMERS)  Tea (sugar ok, NO MILK/CREAM OR CREAMERS) regular and decaf                             Plain Jell-O (NO RED)                                           Fruit ices (not with fruit pulp, NO RED)                                     Popsicles (NO RED)                                                                  Juice: apple, WHITE grape, WHITE cranberry Sports drinks like Gatorade (NO RED) Clear broth(vegetable,chicken,beef)  Drink  Ensure drink AT : 7:30 AM the day of surgery.     The day of surgery:  Drink ONE (1) Pre-Surgery Clear Ensure or G2 at AM the morning of surgery. Drink in one sitting. Do not sip.  This drink was given to you during your hospital  pre-op appointment visit. Nothing else to drink after completing the  Pre-Surgery Clear Ensure or G2.          If you have questions, please contact your surgeon's office.     Oral Hygiene is also important to reduce your risk of infection.                                    Remember - BRUSH YOUR TEETH THE MORNING OF SURGERY WITH YOUR REGULAR TOOTHPASTE   Do NOT smoke after Midnight   Take these medicines the morning of surgery with A SIP OF WATER: gabapentin,famotidine.Use inhalers as usual.Tylenol as needed.  DO NOT TAKE ANY ORAL DIABETIC MEDICATIONS DAY OF YOUR SURGERY  Bring CPAP mask and tubing day of surgery.                              You may not have any metal on your body including hair pins, jewelry, and body piercing             Do not wear make-up, lotions, powders, perfumes/cologne, or deodorant  Do not wear nail polish including gel and S&S, artificial/acrylic nails, or any other type of covering on natural nails including finger and toenails. If you have artificial nails, gel coating, etc. that needs to be removed by a nail salon please have this removed prior to surgery or surgery may need to be canceled/ delayed if the surgeon/ anesthesia feels like they are unable to be safely monitored.   Do not shave  48 hours prior to surgery.   Do not bring valuables to the hospital. [place].   Contacts, dentures or bridgework may not be worn into surgery.   Bring small overnight bag day of surgery.   DO NOT [place]. PHARMACY WILL DISPENSE MEDICATIONS LISTED ON YOUR MEDICATION LIST TO YOU DURING YOUR ADMISSION [place]!    Patients discharged on the day of surgery will not be allowed to drive home.  Someone NEEDS to stay with you for the first 24 hours after anesthesia.   Special Instructions: Bring a copy of your healthcare power of attorney and living will documents         the day of surgery if you haven't scanned them before.              Please read over the following fact sheets you were given: IF YOU HAVE QUESTIONS ABOUT YOUR PRE-OP INSTRUCTIONS  PLEASE CALL [phone]     [facility] - Preparing for Surgery Before surgery, you can play an important role.  Because skin is not sterile, your skin needs to be as free of germs as possible.  You can reduce the number of germs on your skin by washing with CHG (chlorahexidine gluconate) soap before surgery.  CHG is an antiseptic cleaner which kills germs and bonds with the skin to continue killing germs even after washing. Please DO NOT use if you have an allergy to CHG or antibacterial soaps.  If your skin becomes reddened/irritated stop using the CHG and inform your nurse when you arrive at Short Stay. Do not shave (including legs and underarms) for at least 48 hours prior to the first CHG shower.  You may shave your face/neck. Please follow these instructions carefully:  1.  Shower with CHG Soap the night before surgery and the  morning of Surgery.  2.  If you choose to wash your hair, wash your hair first as usual with your  normal  shampoo.  3.  After you shampoo, rinse your hair and body thoroughly to remove the  shampoo.                           4.  Use CHG as you would any other liquid soap.  You can apply chg directly  to the skin and wash  Gently with a scrungie or clean washcloth.  5.  Apply the CHG Soap to your body ONLY FROM THE NECK DOWN.   Do not use on face/ open                           Wound or open sores. Avoid contact with eyes, ears mouth and genitals (private parts).                       Wash face,  Genitals (private parts) with your normal soap.             6.  Wash thoroughly, paying special attention to the area where your surgery  will be performed.  7.  Thoroughly rinse your body with warm water from the neck down.  8.  DO NOT shower/wash with your normal soap after using and rinsing off  the CHG Soap.                9.  Pat yourself dry with a clean towel.            10.  Wear clean pajamas.            11.  Place clean sheets on your bed the  night of your first shower and do not  sleep with pets. Day of Surgery : Do not apply any lotions/deodorants the morning of surgery.  Please wear clean clothes to the hospital/surgery center.  FAILURE TO FOLLOW THESE INSTRUCTIONS MAY RESULT IN THE CANCELLATION OF YOUR SURGERY PATIENT SIGNATURE_________________________________  NURSE SIGNATURE__________________________________  ________________________________________________________________________   Adam Phenix  An incentive spirometer is a tool that can help keep your lungs clear and active. This tool measures how well you are filling your lungs with each breath. Taking long deep breaths may help reverse or decrease the chance of developing breathing (pulmonary) problems (especially infection) following: A long period of time when you are unable to move or be active. BEFORE THE PROCEDURE  If the spirometer includes an indicator to show your best effort, your nurse or respiratory therapist will set it to a desired goal. If possible, sit up straight or lean slightly forward. Try not to slouch. Hold the incentive spirometer in an upright position. INSTRUCTIONS FOR USE  Sit on the edge of your bed if possible, or sit up as far as you can in bed or on a chair. Hold the incentive spirometer in an upright position. Breathe out normally. Place the mouthpiece in your mouth and seal your lips tightly around it. Breathe in slowly and as deeply as possible, raising the piston or the ball toward the top of the column. Hold your breath for 3-5 seconds or for as long as possible. Allow the piston or ball to fall to the bottom of the column. Remove the mouthpiece from your mouth and breathe out normally. Rest for a few seconds and repeat Steps 1 through 7 at least 10 times every 1-2 hours when you are awake. Take your time and take a few normal breaths between deep breaths. The spirometer may include an indicator to show your best effort. Use  the indicator as a goal to work toward during each repetition. After each set of 10 deep breaths, practice coughing to be sure your lungs are clear. If you have an incision (the cut made at the time of surgery), support your incision when coughing by placing a pillow or rolled up towels  firmly against it. Once you are able to get out of bed, walk around indoors and cough well. You may stop using the incentive spirometer when instructed by your caregiver.  RISKS AND COMPLICATIONS Take your time so you do not get dizzy or light-headed. If you are in pain, you may need to take or ask for pain medication before doing incentive spirometry. It is harder to take a deep breath if you are having pain. AFTER USE Rest and breathe slowly and easily. It can be helpful to keep track of a log of your progress. Your caregiver can provide you with a simple table to help with this. If you are using the spirometer at home, follow these instructions: [place] IF:  You are having difficultly using the spirometer. You have trouble using the spirometer as often as instructed. Your pain medication is not giving enough relief while using the spirometer. You develop fever of 100.5 F (38.1 C) or higher. SEEK IMMEDIATE MEDICAL CARE IF:  You cough up bloody sputum that had not been present before. You develop fever of 102 F (38.9 C) or greater. You develop worsening pain at or near the incision site. MAKE SURE YOU:  Understand these instructions. Will watch your condition. Will get help right away if you are not doing well or get worse. Document Released: 03/17/2007 Document Revised: 01/27/2012 Document Reviewed: 05/18/2007 [facility] Patient Information 2014 [place], [place].   ________________________________________________________________________

## 2022-05-22 ENCOUNTER — Encounter (HOSPITAL_COMMUNITY): Payer: Self-pay

## 2022-05-22 ENCOUNTER — Other Ambulatory Visit: Payer: Self-pay

## 2022-05-22 ENCOUNTER — Encounter (HOSPITAL_COMMUNITY)
Admission: RE | Admit: 2022-05-22 | Discharge: 2022-05-22 | Disposition: A | Discharge status: Home / Self Care | Payer: Medicare Other | Source: Ambulatory Visit | Attending: Orthopedic Surgery | Admitting: Orthopedic Surgery

## 2022-05-22 VITALS — BP 117/84 | HR 80 | Temp 98.4°F | Ht 67.0 in | Wt 168.0 lb

## 2022-05-22 DIAGNOSIS — Z01812 Encounter for preprocedural laboratory examination: Secondary | ICD-10-CM | POA: Diagnosis present

## 2022-05-22 DIAGNOSIS — I10 Essential (primary) hypertension: Secondary | ICD-10-CM | POA: Diagnosis not present

## 2022-05-22 DIAGNOSIS — Z01818 Encounter for other preprocedural examination: Secondary | ICD-10-CM

## 2022-05-22 HISTORY — DX: Malignant (primary) neoplasm, unspecified: C80.1

## 2022-05-22 LAB — CBC
HCT: 40.3 % (ref 36.0–46.0)
Hemoglobin: 13.6 g/dL (ref 12.0–15.0)
MCH: 33.3 pg (ref 26.0–34.0)
MCHC: 33.7 g/dL (ref 30.0–36.0)
MCV: 98.8 fL (ref 80.0–100.0)
Platelets: 227 10*3/uL (ref 150–400)
RBC: 4.08 MIL/uL (ref 3.87–5.11)
RDW: 13.3 % (ref 11.5–15.5)
WBC: 5.3 10*3/uL (ref 4.0–10.5)
nRBC: 0 % (ref 0.0–0.2)

## 2022-05-22 LAB — BASIC METABOLIC PANEL
Anion gap: 7 (ref 5–15)
BUN: 15 mg/dL (ref 8–23)
CO2: 26 mmol/L (ref 22–32)
Calcium: 9.3 mg/dL (ref 8.9–10.3)
Chloride: 108 mmol/L (ref 98–111)
Creatinine, Ser: 1.11 mg/dL — ABNORMAL HIGH (ref 0.44–1.00)
GFR, Estimated: 54 mL/min — ABNORMAL LOW (ref >60)
Glucose, Bld: 94 mg/dL (ref 70–99)
Potassium: 3.1 mmol/L — ABNORMAL LOW (ref 3.5–5.1)
Sodium: 141 mmol/L (ref 135–145)

## 2022-05-22 LAB — SURGICAL PCR SCREEN
MRSA, PCR: NEGATIVE
Staphylococcus aureus: NEGATIVE

## 2022-05-22 NOTE — Progress Notes (Signed)
For Short Stay: [place] appointment date: Date of COVID positive in last 71 days: NO  Bowel Prep reminder:   For Anesthesia: PCP - Dr. Earney Mallet. : Clearance: 05/01/22: Chart Cardiologist -   Chest x-ray -  EKG - 05/01/22: requested Stress Test -  ECHO -  Cardiac Cath -  Pacemaker/ICD device last checked: Pacemaker orders received: Device Rep notified:  Spinal Cord Stimulator:  Sleep Study -  CPAP -   Fasting Blood Sugar -  Checks Blood Sugar _____ times a day Date and result of last Hgb A1c-  Blood Thinner Instructions: Aspirin Instructions: Last Dose:  Activity level: Can go up a flight of stairs and activities of daily living without stopping and without chest pain and/or shortness of breath   Able to exercise without chest pain and/or shortness of breath   Unable to go up a flight of stairs without chest pain and/or shortness of breath     Anesthesia review:   Patient denies shortness of breath, fever, cough and chest pain at PAT appointment   Patient verbalized understanding of instructions that were given to them at the PAT appointment. Patient was also instructed that they will need to review over the PAT instructions again at home before surgery.

## 2022-05-25 ENCOUNTER — Ambulatory Visit: Payer: Self-pay | Admitting: Student

## 2022-05-25 NOTE — H&P (Unsigned)
[place] H&P  Patient is admitted for right total hip arthroplasty.  Subjective:  Chief Complaint: right hip pain  HPI: Marie Calhoun, 71 y.o. female, has a history of pain and functional disability in the right hip(s) due to arthritis and patient has failed non-surgical conservative treatments for greater than 12 weeks to include NSAID's and/or analgesics, corticosteriod injections, flexibility and strengthening excercises, supervised PT with diminished ADL's post treatment, use of assistive devices, and activity modification.  Onset of symptoms was gradual starting 10 years ago with rapidlly worsening course since that time.The patient noted no past surgery on the right hip(s).  Patient currently rates pain in the right hip at 10 out of 10 with activity. Patient has night pain, worsening of pain with activity and weight bearing, trendelenberg gait, pain that interfers with activities of daily living, and pain with passive range of motion. Patient has evidence of subchondral cysts, subchondral sclerosis, periarticular osteophytes, and joint space narrowing by imaging studies. This condition presents safety issues increasing the risk of falls.   There is no current active infection.  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   Asthma 05/28/2017   Environmental allergies 05/28/2017   Essential hypertension, benign 05/28/2017   GERD (gastroesophageal reflux disease) 05/28/2017   Past Medical History:  Diagnosis Date   Anxiety    Arthritis    Asthma 05/28/2017   Cancer ([place])    Charcot-Marie-Tooth disease    Depression    Environmental allergies 05/28/2017   Esophageal stricture 05/28/2017   Essential hypertension, benign 05/28/2017   GERD (gastroesophageal reflux disease)  05/28/2017   Hypercholesteremia    Lupus (HCC)    of skin   PONV (postoperative nausea and vomiting)    Seasonal allergies     Past Surgical History:  Procedure Laterality Date   breast tumor\     left benign   CHOLECYSTECTOMY     COLONOSCOPY WITH PROPOFOL N/A 05/30/2021   Procedure: COLONOSCOPY WITH PROPOFOL;  Surgeon: Rogene Houston, MD;  [facility];  Service: Endoscopy;  Laterality: N/A;  [place] N/A 09/11/2017   Procedure: ESOPHAGEAL DILATION;  Surgeon: Rogene Houston, MD;  Location: [facility];  Service: Endoscopy;  Laterality: N/A;   ESOPHAGEAL DILATION N/A 06/17/2018   Procedure: ESOPHAGEAL DILATION;  Surgeon: Rogene Houston, MD;  Location[facility];  Service: Endoscopy;  Laterality: N/A;   ESOPHAGOGASTRODUODENOSCOPY N/A 09/11/2017   Procedure: ESOPHAGOGASTRODUODENOSCOPY (EGD);  Surgeon: Rogene Houston, MD;  [facility];  Service: Endoscopy;  Laterality: N/A;  1:40 - Can NOT come earlier   ESOPHAGOGASTRODUODENOSCOPY N/A 06/17/2018   Procedure: ESOPHAGOGASTRODUODENOSCOPY (EGD);  Surgeon: Rogene Houston, MD;  [facility];  Service: Endoscopy;  Laterality: N/A;  10:30   hand tendon release Left    knee surgeries     [place]   POLYPECTOMY  05/30/2021   Procedure: POLYPECTOMY;  Surgeon: Rogene Houston, MD;  Location: [facility];  Service: Endoscopy;;    Current Outpatient Medications  Medication Sig Dispense Refill Last Dose   acetaminophen (TYLENOL) 500 MG tablet Take 1,000 mg by mouth in the morning.      acyclovir (ZOVIRAX) 400 MG  tablet Take 400 mg by mouth 3 (three) times daily as needed (for cold sores/fever blisters (takes for 7 days when needed)).      albuterol (VENTOLIN HFA) 108 (90 Base) MCG/ACT inhaler Inhale 2 puffs into the lungs every 6 (six) hours as needed for wheezing or shortness of breath. 18 g 3    Calcium  Carb-Cholecalciferol (CALCIUM 600 + D PO) Take 1,200 mg by mouth in the morning.      cycloSPORINE (RESTASIS) 0.05 % ophthalmic emulsion Place 1 drop into both eyes 2 (two) times daily.      famotidine (PEPCID) 20 MG tablet TAKE 1 TABLET TWICE DAILY (Patient taking differently: Take 20 mg by mouth at bedtime as needed for heartburn or indigestion.) 180 tablet 1    fluticasone (FLONASE) 50 MCG/ACT nasal spray Place 1-2 sprays into both nostrils daily as needed for allergies. 16 g 3    folic acid (FOLVITE) 1 MG tablet Take 1 mg by mouth daily.      gabapentin (NEURONTIN) 300 MG capsule Take 300 mg by mouth in the morning, at noon, and at bedtime.      hydrOXYzine (ATARAX/VISTARIL) 25 MG tablet Take 25 mg by mouth at bedtime as needed for itching.      ketoconazole (NIZORAL) 2 % cream Apply 1 Application topically daily. Apply to nail and finger around affected area      lansoprazole (PREVACID) 30 MG capsule TAKE 1 CAPSULE BY MOUTH ONCE DAILY ON AN EMPTY STOMACH, 30-45 MINUTES BEFORE BREAKFAST 90 capsule 0    linaclotide (LINZESS) 72 MCG capsule TAKE 1 CAPSULE EVERY DAY BEFORE BREAKFAST 90 capsule 3    losartan-hydrochlorothiazide (HYZAAR) 50-12.5 MG tablet Take 0.5 tablets by mouth at bedtime.      methotrexate (RHEUMATREX) 2.5 MG tablet Take 5 mg by mouth every Wednesday. Caution:Chemotherapy. Protect from light.      mineral oil light external liquid Apply 1 Application topically daily as needed (In ears of needed).      Multiple Vitamin (MULTIVITAMIN WITH MINERALS) TABS tablet Take 1 tablet by mouth in the morning. Woman's      Omega-3 Fatty Acids (FISH OIL) 1000 MG CAPS Take 1,000 mg by mouth in the morning.      silver sulfADIAZINE (SILVADENE) 1 % cream Apply 1 application topically daily as needed (foot wounds/sores).      triamcinolone ointment (KENALOG) 0.5 % Apply 1 application topically 2 (two) times daily as needed (skin itching/irritation).      vitamin B-12 (CYANOCOBALAMIN) 1000 MCG  tablet Take 2,000 mcg by mouth in the morning.      No current facility-administered medications for this visit.   Allergies  Allergen Reactions   Doxycycline Itching and Rash   Demerol [Meperidine] Nausea Only    nausea   Other Other (See Comments)    Not able to take aspirin, ibuprofen, and inflammatory meds due to ulcers in stomach   Pantoprazole Other (See Comments)    Subacute Cutaneous Lupus of the Skin   Plaquenil [Hydroxychloroquine]     Caused another Rash on top of Lupus Rash   Sulfa Antibiotics Swelling    *Childhood*   Sulfamethoxazole-Trimethoprim Other (See Comments)    Other reaction(s): Unknown   Ranitidine Rash    Social History   Tobacco Use   Smoking status: Never    Passive exposure: Never   Smokeless tobacco: Never  Substance Use Topics   Alcohol use: No    No family history on file.   Review  of Systems  Musculoskeletal:  Positive for arthralgias, back pain, gait problem, joint swelling and myalgias.  Neurological:  Positive for weakness.  Hematological:  Bruises/bleeds easily.  All other systems reviewed and are negative.   Objective:  Physical Exam Constitutional:      Appearance: Normal appearance.  HENT:     Head: Normocephalic and atraumatic.     Nose: Nose normal.     Mouth/Throat:     Mouth: Mucous membranes are moist.     Pharynx: Oropharynx is clear.  Eyes:     Extraocular Movements: Extraocular movements intact.  Cardiovascular:     Rate and Rhythm: Normal rate and regular rhythm.  Pulmonary:     Effort: Pulmonary effort is normal.     Breath sounds: Normal breath sounds.  Abdominal:     General: Abdomen is flat.     Palpations: Abdomen is soft.  Genitourinary:    Comments: Deferred. Musculoskeletal:     Cervical back: Normal range of motion and neck supple.     Comments: Examination of the right hip reveals no skin wounds, lesions, rashes, or erythema. Pain with flexion and rotation of the hip. Mild pain with palpation  over trochanteric region.   On upper and lower extremities patient does have areas of bruising and healing scraps as she says she bruises easily and gets skin tears easily.   Sensation intact in LE bilaterally. Dorsiflexion is limited in LE bilaterally. She wears AFOs with compression stockings at baseline. Motor function otherwise intact. Distal pulses 2+ bilaterally.   No significant pedal edema. Calves soft and non-tender.  Skin:    General: Skin is warm and dry.     Findings: Bruising present.  Neurological:     General: No focal deficit present.     Mental Status: She is alert and oriented to person, place, and time.  Psychiatric:        Mood and Affect: Mood normal.        Behavior: Behavior normal.        Thought Content: Thought content normal.        Judgment: Judgment normal.     Vital signs in last 24 hours: '@VSRANGES'$ @  Labs:   Estimated body mass index is 26.31 kg/m as calculated from the following:   Height as of 05/22/22: '5\' 7"'$  (1.702 m).   Weight as of 05/22/22: 76.2 kg.   Imaging Review Plain radiographs demonstrate severe degenerative joint disease of the right hip(s). The bone quality appears to be adequate for age and reported activity level.      Assessment/Plan:  End stage arthritis, right hip(s)  The patient history, physical examination, clinical judgement of the provider and imaging studies are consistent with end stage degenerative joint disease of the right hip(s) and total hip arthroplasty is deemed medically necessary. The treatment options including medical management, injection therapy, arthroscopy and arthroplasty were discussed at length. The risks and benefits of total hip arthroplasty were presented and reviewed. The risks due to aseptic loosening, infection, stiffness, dislocation/subluxation,  thromboembolic complications and other imponderables were discussed.  The patient acknowledged the explanation, agreed to proceed with the plan and  consent was signed. Patient is being admitted for inpatient treatment for surgery, pain control, PT, OT, prophylactic antibiotics, VTE prophylaxis, progressive ambulation and ADL's and discharge planning.The patient is planning to be discharged home with HEP.   Therapy Plans: HEP.  Disposition: Home with friend/POA Kieth Brightly Planned DVT Prophylaxis: aspirin '81mg'$  BID DME needed: walker PCP: Cleared Cardiology:  Cleared TXA: Yes Allergies:  - Demerol - vomiting - Doxycycline - rash - Ranitidine - rash - Sulfa antibiotics - swelling/unknown childhood. Anesthesia Concerns: Nausea with anesthesia. BMI: Last HgbA1c: Not diabetic Other: - Asthma - SLE - methotrexate. Stopped 04/29/22. Hold until further notice.  - Charcot Marie Tooth, AFOs bilaterally - Right lower back pain. - Cr. 0.85 - Hgb 14.2, K+ 3.9. Patient had records with her. - Gabapentin '900mg'$  TID at baseline.    Patient's anticipated LOS is less than 2 midnights, meeting these requirements: - Younger than 18 - Lives within 1 hour of care - Has a competent adult at home to recover with post-op recover - NO history of  - Chronic pain requiring opiods  - Diabetes  - Coronary Artery Disease  - Heart failure  - Heart attack  - Stroke  - DVT/VTE  - Cardiac arrhythmia  - Respiratory Failure/COPD  - Renal failure  - Anemia  - Advanced Liver disease

## 2022-05-25 NOTE — H&P (View-Only) (Signed)
[facility] H&P  Patient is admitted for right total hip arthroplasty.  Subjective:  Chief Complaint: right hip pain  HPI: Marie Calhoun, 71 y.o. female, has a history of pain and functional disability in the right hip(s) due to arthritis and patient has failed non-surgical conservative treatments for greater than 12 weeks to include NSAID's and/or analgesics, corticosteriod injections, flexibility and strengthening excercises, supervised PT with diminished ADL's post treatment, use of assistive devices, and activity modification.  Onset of symptoms was gradual starting 10 years ago with rapidlly worsening course since that time.The patient noted no past surgery on the right hip(s).  Patient currently rates pain in the right hip at 10 out of 10 with activity. Patient has night pain, worsening of pain with activity and weight bearing, trendelenberg gait, pain that interfers with activities of daily living, and pain with passive range of motion. Patient has evidence of subchondral cysts, subchondral sclerosis, periarticular osteophytes, and joint space narrowing by imaging studies. This condition presents safety issues increasing the risk of falls.   There is no current active infection.  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   Asthma 05/28/2017   Environmental allergies 05/28/2017   Essential hypertension, benign 05/28/2017   GERD (gastroesophageal reflux disease) 05/28/2017   Past Medical History:  Diagnosis Date   Anxiety    Arthritis    Asthma 05/28/2017   Cancer ([place])    Charcot-Marie-Tooth disease    Depression    Environmental allergies 05/28/2017   Esophageal stricture 05/28/2017   Essential hypertension, benign 05/28/2017   GERD (gastroesophageal reflux disease)  05/28/2017   Hypercholesteremia    Lupus (HCC)    of skin   PONV (postoperative nausea and vomiting)    Seasonal allergies     Past Surgical History:  Procedure Laterality Date   breast tumor\     left benign   CHOLECYSTECTOMY     COLONOSCOPY WITH PROPOFOL N/A 05/30/2021   Procedure: COLONOSCOPY WITH PROPOFOL;  Surgeon: Rogene Houston, MD;  [facility];  Service: Endoscopy;  Laterality: N/A;  [place] N/A 09/11/2017   Procedure: ESOPHAGEAL DILATION;  Surgeon: Rogene Houston, MD;  [facility];  Service: Endoscopy;  Laterality: N/A;   ESOPHAGEAL DILATION N/A 06/17/2018   Procedure: ESOPHAGEAL DILATION;  Surgeon: Rogene Houston, MD;  [facility];  Service: Endoscopy;  Laterality: N/A;   ESOPHAGOGASTRODUODENOSCOPY N/A 09/11/2017   Procedure: ESOPHAGOGASTRODUODENOSCOPY (EGD);  Surgeon: Rogene Houston, MD;  [facility];  Service: Endoscopy;  Laterality: N/A;  1:40 - Can NOT come earlier   ESOPHAGOGASTRODUODENOSCOPY N/A 06/17/2018   Procedure: ESOPHAGOGASTRODUODENOSCOPY (EGD);  Surgeon: Rogene Houston, MD;  [facility];  Service: Endoscopy;  Laterality: N/A;  10:30   hand tendon release Left    knee surgeries     [place]   POLYPECTOMY  05/30/2021   Procedure: POLYPECTOMY;  Surgeon: Rogene Houston, MD;  [facility]: [facility];  Service: Endoscopy;;    Current Outpatient Medications  Medication Sig Dispense Refill Last Dose   acetaminophen (TYLENOL) 500 MG tablet Take 1,000 mg by mouth in the morning.      acyclovir (ZOVIRAX) 400 MG  tablet Take 400 mg by mouth 3 (three) times daily as needed (for cold sores/fever blisters (takes for 7 days when needed)).      albuterol (VENTOLIN HFA) 108 (90 Base) MCG/ACT inhaler Inhale 2 puffs into the lungs every 6 (six) hours as needed for wheezing or shortness of breath. 18 g 3    Calcium  Carb-Cholecalciferol (CALCIUM 600 + D PO) Take 1,200 mg by mouth in the morning.      cycloSPORINE (RESTASIS) 0.05 % ophthalmic emulsion Place 1 drop into both eyes 2 (two) times daily.      famotidine (PEPCID) 20 MG tablet TAKE 1 TABLET TWICE DAILY (Patient taking differently: Take 20 mg by mouth at bedtime as needed for heartburn or indigestion.) 180 tablet 1    fluticasone (FLONASE) 50 MCG/ACT nasal spray Place 1-2 sprays into both nostrils daily as needed for allergies. 16 g 3    folic acid (FOLVITE) 1 MG tablet Take 1 mg by mouth daily.      gabapentin (NEURONTIN) 300 MG capsule Take 300 mg by mouth in the morning, at noon, and at bedtime.      hydrOXYzine (ATARAX/VISTARIL) 25 MG tablet Take 25 mg by mouth at bedtime as needed for itching.      ketoconazole (NIZORAL) 2 % cream Apply 1 Application topically daily. Apply to nail and finger around affected area      lansoprazole (PREVACID) 30 MG capsule TAKE 1 CAPSULE BY MOUTH ONCE DAILY ON AN EMPTY STOMACH, 30-45 MINUTES BEFORE BREAKFAST 90 capsule 0    linaclotide (LINZESS) 72 MCG capsule TAKE 1 CAPSULE EVERY DAY BEFORE BREAKFAST 90 capsule 3    losartan-hydrochlorothiazide (HYZAAR) 50-12.5 MG tablet Take 0.5 tablets by mouth at bedtime.      methotrexate (RHEUMATREX) 2.5 MG tablet Take 5 mg by mouth every Wednesday. Caution:Chemotherapy. Protect from light.      mineral oil light external liquid Apply 1 Application topically daily as needed (In ears of needed).      Multiple Vitamin (MULTIVITAMIN WITH MINERALS) TABS tablet Take 1 tablet by mouth in the morning. Woman's      Omega-3 Fatty Acids (FISH OIL) 1000 MG CAPS Take 1,000 mg by mouth in the morning.      silver sulfADIAZINE (SILVADENE) 1 % cream Apply 1 application topically daily as needed (foot wounds/sores).      triamcinolone ointment (KENALOG) 0.5 % Apply 1 application topically 2 (two) times daily as needed (skin itching/irritation).      vitamin B-12 (CYANOCOBALAMIN) 1000 MCG  tablet Take 2,000 mcg by mouth in the morning.      No current facility-administered medications for this visit.   Allergies  Allergen Reactions   Doxycycline Itching and Rash   Demerol [Meperidine] Nausea Only    nausea   Other Other (See Comments)    Not able to take aspirin, ibuprofen, and inflammatory meds due to ulcers in stomach   Pantoprazole Other (See Comments)    Subacute Cutaneous Lupus of the Skin   Plaquenil [Hydroxychloroquine]     Caused another Rash on top of Lupus Rash   Sulfa Antibiotics Swelling    *Childhood*   Sulfamethoxazole-Trimethoprim Other (See Comments)    Other reaction(s): Unknown   Ranitidine Rash    Social History   Tobacco Use   Smoking status: Never    Passive exposure: Never   Smokeless tobacco: Never  Substance Use Topics   Alcohol use: No    No family history on file.   Review  of Systems  Musculoskeletal:  Positive for arthralgias, back pain, gait problem, joint swelling and myalgias.  Neurological:  Positive for weakness.  Hematological:  Bruises/bleeds easily.  All other systems reviewed and are negative.   Objective:  Physical Exam Constitutional:      Appearance: Normal appearance.  HENT:     Head: Normocephalic and atraumatic.     Nose: Nose normal.     Mouth/Throat:     Mouth: Mucous membranes are moist.     Pharynx: Oropharynx is clear.  Eyes:     Extraocular Movements: Extraocular movements intact.  Cardiovascular:     Rate and Rhythm: Normal rate and regular rhythm.  Pulmonary:     Effort: Pulmonary effort is normal.     Breath sounds: Normal breath sounds.  Abdominal:     General: Abdomen is flat.     Palpations: Abdomen is soft.  Genitourinary:    Comments: Deferred. Musculoskeletal:     Cervical back: Normal range of motion and neck supple.     Comments: Examination of the right hip reveals no skin wounds, lesions, rashes, or erythema. Pain with flexion and rotation of the hip. Mild pain with palpation  over trochanteric region.   On upper and lower extremities patient does have areas of bruising and healing scraps as she says she bruises easily and gets skin tears easily.   Sensation intact in LE bilaterally. Dorsiflexion is limited in LE bilaterally. She wears AFOs with compression stockings at baseline. Motor function otherwise intact. Distal pulses 2+ bilaterally.   No significant pedal edema. Calves soft and non-tender.  Skin:    General: Skin is warm and dry.     Findings: Bruising present.  Neurological:     General: No focal deficit present.     Mental Status: She is alert and oriented to person, place, and time.  Psychiatric:        Mood and Affect: Mood normal.        Behavior: Behavior normal.        Thought Content: Thought content normal.        Judgment: Judgment normal.     Vital signs in last 24 hours: '@VSRANGES'$ @  Labs:   Estimated body mass index is 26.31 kg/m as calculated from the following:   Height as of 05/22/22: '5\' 7"'$  (1.702 m).   Weight as of 05/22/22: 76.2 kg.   Imaging Review Plain radiographs demonstrate severe degenerative joint disease of the right hip(s). The bone quality appears to be adequate for age and reported activity level.      Assessment/Plan:  End stage arthritis, right hip(s)  The patient history, physical examination, clinical judgement of the provider and imaging studies are consistent with end stage degenerative joint disease of the right hip(s) and total hip arthroplasty is deemed medically necessary. The treatment options including medical management, injection therapy, arthroscopy and arthroplasty were discussed at length. The risks and benefits of total hip arthroplasty were presented and reviewed. The risks due to aseptic loosening, infection, stiffness, dislocation/subluxation,  thromboembolic complications and other imponderables were discussed.  The patient acknowledged the explanation, agreed to proceed with the plan and  consent was signed. Patient is being admitted for inpatient treatment for surgery, pain control, PT, OT, prophylactic antibiotics, VTE prophylaxis, progressive ambulation and ADL's and discharge planning.The patient is planning to be discharged home with HEP.   Therapy Plans: HEP.  Disposition: Home with friend/POA Kieth Brightly Planned DVT Prophylaxis: aspirin '81mg'$  BID DME needed: walker PCP: Cleared Cardiology:  Cleared TXA: Yes Allergies:  - Demerol - vomiting - Doxycycline - rash - Ranitidine - rash - Sulfa antibiotics - swelling/unknown childhood. Anesthesia Concerns: Nausea with anesthesia. BMI: Last HgbA1c: Not diabetic Other: - Asthma - SLE - methotrexate. Stopped 04/29/22. Hold until further notice.  - Charcot Marie Tooth, AFOs bilaterally - Right lower back pain. - Cr. 0.85 - Hgb 14.2, K+ 3.9. Patient had records with her. - Gabapentin '900mg'$  TID at baseline.    Patient's anticipated LOS is less than 2 midnights, meeting these requirements: - Younger than 49 - Lives within 1 hour of care - Has a competent adult at home to recover with post-op recover - NO history of  - Chronic pain requiring opiods  - Diabetes  - Coronary Artery Disease  - Heart failure  - Heart attack  - Stroke  - DVT/VTE  - Cardiac arrhythmia  - Respiratory Failure/COPD  - Renal failure  - Anemia  - Advanced Liver disease

## 2022-05-30 ENCOUNTER — Encounter (HOSPITAL_COMMUNITY)
Admission: RE | Disposition: A | Discharge status: Home / Self Care | Payer: Self-pay | Source: Ambulatory Visit | Attending: Orthopedic Surgery

## 2022-05-30 ENCOUNTER — Observation Stay (HOSPITAL_COMMUNITY)
Admission: RE | Admit: 2022-05-30 | Discharge: 2022-05-31 | Disposition: A | Discharge status: Home / Self Care | Payer: Medicare Other | Source: Ambulatory Visit | Attending: Orthopedic Surgery | Admitting: Orthopedic Surgery

## 2022-05-30 ENCOUNTER — Ambulatory Visit (HOSPITAL_COMMUNITY): Payer: Medicare Other

## 2022-05-30 ENCOUNTER — Ambulatory Visit (HOSPITAL_COMMUNITY): Payer: Medicare Other | Admitting: Anesthesiology

## 2022-05-30 ENCOUNTER — Ambulatory Visit (HOSPITAL_BASED_OUTPATIENT_CLINIC_OR_DEPARTMENT_OTHER): Payer: Medicare Other | Admitting: Anesthesiology

## 2022-05-30 ENCOUNTER — Other Ambulatory Visit: Payer: Self-pay

## 2022-05-30 ENCOUNTER — Encounter (HOSPITAL_COMMUNITY): Payer: Self-pay | Admitting: Orthopedic Surgery

## 2022-05-30 DIAGNOSIS — I10 Essential (primary) hypertension: Secondary | ICD-10-CM | POA: Insufficient documentation

## 2022-05-30 DIAGNOSIS — J45909 Unspecified asthma, uncomplicated: Secondary | ICD-10-CM | POA: Diagnosis not present

## 2022-05-30 DIAGNOSIS — Z79899 Other long term (current) drug therapy: Secondary | ICD-10-CM | POA: Insufficient documentation

## 2022-05-30 DIAGNOSIS — M1611 Unilateral primary osteoarthritis, right hip: Secondary | ICD-10-CM | POA: Diagnosis present

## 2022-05-30 DIAGNOSIS — Z96641 Presence of right artificial hip joint: Secondary | ICD-10-CM | POA: Diagnosis present

## 2022-05-30 DIAGNOSIS — Z859 Personal history of malignant neoplasm, unspecified: Secondary | ICD-10-CM | POA: Insufficient documentation

## 2022-05-30 HISTORY — PX: TOTAL HIP ARTHROPLASTY: SHX124

## 2022-05-30 LAB — TYPE AND SCREEN
ABO/RH(D): O NEG
Antibody Screen: NEGATIVE

## 2022-05-30 LAB — ABO/RH: ABO/RH(D): O NEG

## 2022-05-30 SURGERY — ARTHROPLASTY, HIP, TOTAL, ANTERIOR APPROACH
Anesthesia: Spinal | Site: Hip | Laterality: Right

## 2022-05-30 MED ORDER — CHLORHEXIDINE GLUCONATE 0.12 % MT SOLN
15.0000 mL | Freq: Once | OROMUCOSAL | Status: AC
  Administered 2022-05-30: 15 mL via OROMUCOSAL

## 2022-05-30 MED ORDER — ORAL CARE MOUTH RINSE: 15.0000 mL | Freq: Once | OROMUCOSAL | Status: AC

## 2022-05-30 MED ORDER — OXYCODONE-ACETAMINOPHEN 5-325 MG PO TABS
1.0000 | ORAL_TABLET | ORAL | Status: DC | PRN
  Administered 2022-05-30 – 2022-05-31 (×4): 1 via ORAL
  Filled 2022-05-30 (×4): qty 1

## 2022-05-30 MED ORDER — POVIDONE-IODINE 10 % EX SWAB
2.0000 | Freq: Once | CUTANEOUS | Status: AC
  Administered 2022-05-30: 2 via TOPICAL

## 2022-05-30 MED ORDER — ONDANSETRON HCL 4 MG/2ML IJ SOLN: 4.0000 mg | Freq: Four times a day (QID) | INTRAMUSCULAR | Status: DC | PRN

## 2022-05-30 MED ORDER — ASPIRIN 81 MG PO CHEW: 81.0000 mg | CHEWABLE_TABLET | Freq: Two times a day (BID) | ORAL | 0 refills | Status: AC

## 2022-05-30 MED ORDER — LIDOCAINE 2% (20 MG/ML) 5 ML SYRINGE
INTRAMUSCULAR | Status: DC | PRN
  Administered 2022-05-30: 50 mg via INTRAVENOUS

## 2022-05-30 MED ORDER — ONDANSETRON HCL 4 MG PO TABS: 4.0000 mg | ORAL_TABLET | Freq: Three times a day (TID) | ORAL | 0 refills | Status: DC | PRN

## 2022-05-30 MED ORDER — KETOROLAC TROMETHAMINE 30 MG/ML IJ SOLN
INTRAMUSCULAR | Status: DC | PRN
  Administered 2022-05-30: 30 mg via INTRAVENOUS

## 2022-05-30 MED ORDER — LACTATED RINGERS IV SOLN: INTRAVENOUS | Status: DC

## 2022-05-30 MED ORDER — METHOCARBAMOL 500 MG PO TABS
500.0000 mg | ORAL_TABLET | Freq: Four times a day (QID) | ORAL | Status: DC | PRN
  Administered 2022-05-30 – 2022-05-31 (×2): 500 mg via ORAL
  Filled 2022-05-30: qty 1

## 2022-05-30 MED ORDER — MIDAZOLAM HCL 2 MG/2ML IJ SOLN
INTRAMUSCULAR | Status: AC
  Filled 2022-05-30: qty 2

## 2022-05-30 MED ORDER — HYDROCODONE-ACETAMINOPHEN 7.5-325 MG PO TABS: 1.0000 | ORAL_TABLET | ORAL | Status: DC | PRN

## 2022-05-30 MED ORDER — FENTANYL CITRATE PF 50 MCG/ML IJ SOSY
PREFILLED_SYRINGE | INTRAMUSCULAR | Status: AC
  Filled 2022-05-30: qty 2

## 2022-05-30 MED ORDER — KETOROLAC TROMETHAMINE 30 MG/ML IJ SOLN
INTRAMUSCULAR | Status: AC
  Filled 2022-05-30: qty 1

## 2022-05-30 MED ORDER — CEFAZOLIN SODIUM-DEXTROSE 2-4 GM/100ML-% IV SOLN
INTRAVENOUS | Status: AC
  Administered 2022-05-30: 2000 mg
  Filled 2022-05-30: qty 100

## 2022-05-30 MED ORDER — MORPHINE SULFATE (PF) 2 MG/ML IV SOLN
INTRAVENOUS | Status: AC
  Filled 2022-05-30: qty 1

## 2022-05-30 MED ORDER — LACTATED RINGERS IV BOLUS
250.0000 mL | Freq: Once | INTRAVENOUS | Status: AC
Start: 2022-05-30 — End: 2022-05-30
  Administered 2022-05-30: 250 mL via INTRAVENOUS

## 2022-05-30 MED ORDER — CEFAZOLIN SODIUM-DEXTROSE 2-4 GM/100ML-% IV SOLN
2.0000 g | INTRAVENOUS | Status: AC
  Administered 2022-05-30: 2 g via INTRAVENOUS
  Filled 2022-05-30: qty 100

## 2022-05-30 MED ORDER — METHOCARBAMOL 500 MG PO TABS
ORAL_TABLET | ORAL | Status: AC
  Filled 2022-05-30: qty 1

## 2022-05-30 MED ORDER — MIDAZOLAM HCL 2 MG/2ML IJ SOLN
INTRAMUSCULAR | Status: DC | PRN
  Administered 2022-05-30: 2 mg via INTRAVENOUS

## 2022-05-30 MED ORDER — SODIUM CHLORIDE (PF) 0.9 % IJ SOLN
INTRAMUSCULAR | Status: DC | PRN
  Administered 2022-05-30: 30 mL via INTRAVENOUS

## 2022-05-30 MED ORDER — ONDANSETRON HCL 4 MG/2ML IJ SOLN: 4.0000 mg | Freq: Once | INTRAMUSCULAR | Status: DC | PRN

## 2022-05-30 MED ORDER — SODIUM CHLORIDE 0.9 % IR SOLN
Status: DC | PRN
  Administered 2022-05-30: 1000 mL

## 2022-05-30 MED ORDER — POVIDONE-IODINE 10 % EX SWAB: 2.0000 | Freq: Once | CUTANEOUS | Status: DC

## 2022-05-30 MED ORDER — BUPIVACAINE-EPINEPHRINE 0.25% -1:200000 IJ SOLN
INTRAMUSCULAR | Status: DC | PRN
  Administered 2022-05-30: 30 mL

## 2022-05-30 MED ORDER — POLYETHYLENE GLYCOL 3350 17 G PO PACK: 17.0000 g | PACK | Freq: Every day | ORAL | 0 refills | Status: DC | PRN

## 2022-05-30 MED ORDER — ONDANSETRON HCL 4 MG PO TABS: 4.0000 mg | ORAL_TABLET | Freq: Four times a day (QID) | ORAL | Status: DC | PRN

## 2022-05-30 MED ORDER — BUPIVACAINE-EPINEPHRINE (PF) 0.25% -1:200000 IJ SOLN
INTRAMUSCULAR | Status: AC
  Filled 2022-05-30: qty 30

## 2022-05-30 MED ORDER — MORPHINE SULFATE (PF) 2 MG/ML IV SOLN
0.5000 mg | INTRAVENOUS | Status: DC | PRN
  Administered 2022-05-30 (×2): 1 mg via INTRAVENOUS

## 2022-05-30 MED ORDER — PROPOFOL 500 MG/50ML IV EMUL
INTRAVENOUS | Status: DC | PRN
  Administered 2022-05-30: 100 ug/kg/min via INTRAVENOUS

## 2022-05-30 MED ORDER — FENTANYL CITRATE PF 50 MCG/ML IJ SOSY
PREFILLED_SYRINGE | INTRAMUSCULAR | Status: AC
  Filled 2022-05-30: qty 1

## 2022-05-30 MED ORDER — ONDANSETRON HCL 4 MG/2ML IJ SOLN
INTRAMUSCULAR | Status: DC | PRN
  Administered 2022-05-30: 4 mg via INTRAVENOUS

## 2022-05-30 MED ORDER — HYDROCODONE-ACETAMINOPHEN 5-325 MG PO TABS
ORAL_TABLET | ORAL | Status: AC
  Filled 2022-05-30: qty 1

## 2022-05-30 MED ORDER — SENNA 8.6 MG PO TABS
1.0000 | ORAL_TABLET | Freq: Two times a day (BID) | ORAL | Status: DC
  Administered 2022-05-30 – 2022-05-31 (×2): 8.6 mg via ORAL
  Filled 2022-05-30 (×3): qty 1

## 2022-05-30 MED ORDER — FENTANYL CITRATE (PF) 100 MCG/2ML IJ SOLN
INTRAMUSCULAR | Status: AC
  Filled 2022-05-30: qty 2

## 2022-05-30 MED ORDER — WATER FOR IRRIGATION, STERILE IR SOLN
Status: DC | PRN
  Administered 2022-05-30: 2000 mL

## 2022-05-30 MED ORDER — METHOCARBAMOL 500 MG IVPB - SIMPLE MED
INTRAVENOUS | Status: AC
  Filled 2022-05-30: qty 55

## 2022-05-30 MED ORDER — FENTANYL CITRATE (PF) 250 MCG/5ML IJ SOLN
INTRAMUSCULAR | Status: DC | PRN
  Administered 2022-05-30: 50 ug via INTRAVENOUS

## 2022-05-30 MED ORDER — KETOROLAC TROMETHAMINE 15 MG/ML IJ SOLN
7.5000 mg | Freq: Four times a day (QID) | INTRAMUSCULAR | Status: DC
  Administered 2022-05-30 – 2022-05-31 (×3): 7.5 mg via INTRAVENOUS
  Filled 2022-05-30 (×2): qty 1

## 2022-05-30 MED ORDER — POLYETHYLENE GLYCOL 3350 17 G PO PACK: 17.0000 g | PACK | Freq: Every day | ORAL | Status: DC | PRN

## 2022-05-30 MED ORDER — DOCUSATE SODIUM 100 MG PO CAPS: 100.0000 mg | ORAL_CAPSULE | Freq: Two times a day (BID) | ORAL | 1 refills | Status: DC

## 2022-05-30 MED ORDER — METOCLOPRAMIDE HCL 5 MG/ML IJ SOLN: 5.0000 mg | Freq: Three times a day (TID) | INTRAMUSCULAR | Status: DC | PRN

## 2022-05-30 MED ORDER — MENTHOL 3 MG MT LOZG: 1.0000 | LOZENGE | OROMUCOSAL | Status: DC | PRN

## 2022-05-30 MED ORDER — PHENOL 1.4 % MT LIQD: 1.0000 | OROMUCOSAL | Status: DC | PRN

## 2022-05-30 MED ORDER — BUPIVACAINE IN DEXTROSE 0.75-8.25 % IT SOLN
INTRATHECAL | Status: DC | PRN
  Administered 2022-05-30: 2 mL via INTRATHECAL

## 2022-05-30 MED ORDER — OXYCODONE HCL 5 MG PO TABS: 5.0000 mg | ORAL_TABLET | ORAL | 0 refills | Status: DC | PRN

## 2022-05-30 MED ORDER — ACETAMINOPHEN 325 MG PO TABS: 325.0000 mg | ORAL_TABLET | Freq: Four times a day (QID) | ORAL | Status: DC | PRN

## 2022-05-30 MED ORDER — ALUM & MAG HYDROXIDE-SIMETH 200-200-20 MG/5ML PO SUSP: 30.0000 mL | ORAL | Status: DC | PRN

## 2022-05-30 MED ORDER — DIPHENHYDRAMINE HCL 12.5 MG/5ML PO ELIX: 12.5000 mg | ORAL_SOLUTION | ORAL | Status: DC | PRN

## 2022-05-30 MED ORDER — METOCLOPRAMIDE HCL 5 MG PO TABS: 5.0000 mg | ORAL_TABLET | Freq: Three times a day (TID) | ORAL | Status: DC | PRN

## 2022-05-30 MED ORDER — SODIUM CHLORIDE (PF) 0.9 % IJ SOLN
INTRAMUSCULAR | Status: AC
  Filled 2022-05-30: qty 50

## 2022-05-30 MED ORDER — KETOROLAC TROMETHAMINE 15 MG/ML IJ SOLN
INTRAMUSCULAR | Status: AC
  Filled 2022-05-30: qty 1

## 2022-05-30 MED ORDER — FENTANYL CITRATE PF 50 MCG/ML IJ SOSY
25.0000 ug | PREFILLED_SYRINGE | INTRAMUSCULAR | Status: DC | PRN
  Administered 2022-05-30 (×3): 50 ug via INTRAVENOUS

## 2022-05-30 MED ORDER — CEFAZOLIN SODIUM-DEXTROSE 2-4 GM/100ML-% IV SOLN
2.0000 g | Freq: Four times a day (QID) | INTRAVENOUS | Status: AC
  Administered 2022-05-30 (×2): 2 g via INTRAVENOUS
  Filled 2022-05-30: qty 100

## 2022-05-30 MED ORDER — DEXAMETHASONE SODIUM PHOSPHATE 10 MG/ML IJ SOLN
INTRAMUSCULAR | Status: DC | PRN
  Administered 2022-05-30 (×2): 5 mg via INTRAVENOUS

## 2022-05-30 MED ORDER — ACETAMINOPHEN 500 MG PO TABS
1000.0000 mg | ORAL_TABLET | Freq: Once | ORAL | Status: AC
  Administered 2022-05-30: 1000 mg via ORAL
  Filled 2022-05-30: qty 2

## 2022-05-30 MED ORDER — HYDROCODONE-ACETAMINOPHEN 5-325 MG PO TABS
1.0000 | ORAL_TABLET | ORAL | Status: DC | PRN
  Administered 2022-05-30: 1 via ORAL

## 2022-05-30 MED ORDER — DOCUSATE SODIUM 100 MG PO CAPS
100.0000 mg | ORAL_CAPSULE | Freq: Two times a day (BID) | ORAL | Status: DC
  Administered 2022-05-30 – 2022-05-31 (×2): 100 mg via ORAL
  Filled 2022-05-30 (×2): qty 1

## 2022-05-30 MED ORDER — SENNA 8.6 MG PO TABS: 2.0000 | ORAL_TABLET | Freq: Every day | ORAL | 1 refills | Status: DC

## 2022-05-30 MED ORDER — METHOCARBAMOL 500 MG IVPB - SIMPLE MED
500.0000 mg | Freq: Four times a day (QID) | INTRAVENOUS | Status: DC | PRN
  Administered 2022-05-30: 500 mg via INTRAVENOUS

## 2022-05-30 MED ORDER — LACTATED RINGERS IV BOLUS
500.0000 mL | Freq: Once | INTRAVENOUS | Status: AC
Start: 2022-05-30 — End: 2022-05-30
  Administered 2022-05-30: 500 mL via INTRAVENOUS

## 2022-05-30 MED ORDER — TRANEXAMIC ACID-NACL 1000-0.7 MG/100ML-% IV SOLN
1000.0000 mg | INTRAVENOUS | Status: AC
  Administered 2022-05-30: 1000 mg via INTRAVENOUS
  Filled 2022-05-30: qty 100

## 2022-05-30 MED ORDER — ISOPROPYL ALCOHOL 70 % SOLN
Status: DC | PRN
  Administered 2022-05-30: 1 via TOPICAL

## 2022-05-30 MED ORDER — SODIUM CHLORIDE 0.9 % IV SOLN: INTRAVENOUS | Status: DC

## 2022-05-30 MED ORDER — ASPIRIN 81 MG PO CHEW
81.0000 mg | CHEWABLE_TABLET | Freq: Two times a day (BID) | ORAL | Status: DC
  Administered 2022-05-30 – 2022-05-31 (×2): 81 mg via ORAL
  Filled 2022-05-30 (×2): qty 1

## 2022-05-30 MED ORDER — HYDROCODONE-ACETAMINOPHEN 5-325 MG PO TABS
ORAL_TABLET | ORAL | Status: AC
  Administered 2022-05-30: 1 via ORAL
  Filled 2022-05-30: qty 1

## 2022-05-30 SURGICAL SUPPLY — 59 items
ACE SHELL 54 4H HIP (Shell) ×2 IMPLANT
BAG COUNTER SPONGE SURGICOUNT (BAG) ×1 IMPLANT
BAG DECANTER FOR FLEXI CONT (MISCELLANEOUS) IMPLANT
BAG ZIPLOCK 12X15 (MISCELLANEOUS) IMPLANT
CHLORAPREP W/TINT 26 (MISCELLANEOUS) ×2 IMPLANT
COVER PERINEAL POST (MISCELLANEOUS) ×2 IMPLANT
COVER SURGICAL LIGHT HANDLE (MISCELLANEOUS) ×2 IMPLANT
DERMABOND ADVANCED (GAUZE/BANDAGES/DRESSINGS) ×1
DERMABOND ADVANCED .7 DNX12 (GAUZE/BANDAGES/DRESSINGS) ×2 IMPLANT
DRAPE IMP U-DRAPE 54X76 (DRAPES) ×2 IMPLANT
DRAPE SHEET LG 3/4 BI-LAMINATE (DRAPES) ×6 IMPLANT
DRAPE STERI IOBAN 125X83 (DRAPES) ×2 IMPLANT
DRAPE U-SHAPE 47X51 STRL (DRAPES) ×4 IMPLANT
DRSG AQUACEL AG ADV 3.5X10 (GAUZE/BANDAGES/DRESSINGS) ×2 IMPLANT
ELECT REM PT RETURN 15FT ADLT (MISCELLANEOUS) ×2 IMPLANT
GAUZE SPONGE 4X4 12PLY STRL (GAUZE/BANDAGES/DRESSINGS) ×2 IMPLANT
GLOVE BIO SURGEON STRL SZ8.5 (GLOVE) ×4 IMPLANT
GLOVE BIOGEL M 7.0 STRL (GLOVE) ×2 IMPLANT
GLOVE BIOGEL PI IND STRL 7.5 (GLOVE) ×1 IMPLANT
GLOVE BIOGEL PI IND STRL 8.5 (GLOVE) ×1 IMPLANT
GLOVE BIOGEL PI INDICATOR 7.5 (GLOVE) ×1
GLOVE BIOGEL PI INDICATOR 8.5 (GLOVE) ×1
GLOVE SURG LX 7.5 STRW (GLOVE) ×2
GLOVE SURG LX STRL 7.5 STRW (GLOVE) ×2 IMPLANT
GOWN SPEC L3 XXLG W/TWL (GOWN DISPOSABLE) ×2 IMPLANT
GOWN STRL REUS W/ TWL XL LVL3 (GOWN DISPOSABLE) ×1 IMPLANT
GOWN STRL REUS W/TWL XL LVL3 (GOWN DISPOSABLE) ×1
HANDPIECE INTERPULSE COAX TIP (DISPOSABLE) ×1
HEAD CERAMIC BIOLOX 36 (Head) ×1 IMPLANT
HOLDER FOLEY CATH W/STRAP (MISCELLANEOUS) ×2 IMPLANT
HOOD PEEL AWAY FLYTE STAYCOOL (MISCELLANEOUS) ×6 IMPLANT
KIT TURNOVER KIT A (KITS) ×1 IMPLANT
LINER ACETAB NN G7 F 36 (Liner) ×1 IMPLANT
MANIFOLD NEPTUNE II (INSTRUMENTS) ×2 IMPLANT
MARKER SKIN DUAL TIP RULER LAB (MISCELLANEOUS) ×2 IMPLANT
NDL SAFETY ECLIPSE 18X1.5 (NEEDLE) ×1 IMPLANT
NDL SPNL 18GX3.5 QUINCKE PK (NEEDLE) ×1 IMPLANT
NEEDLE HYPO 18GX1.5 SHARP (NEEDLE) ×1
NEEDLE SPNL 18GX3.5 QUINCKE PK (NEEDLE) ×2 IMPLANT
PACK ANTERIOR HIP CUSTOM (KITS) ×2 IMPLANT
PENCIL SMOKE EVACUATOR (MISCELLANEOUS) IMPLANT
SAW OSC TIP CART 19.5X105X1.3 (SAW) ×2 IMPLANT
SEALER BIPOLAR AQUA 6.0 (INSTRUMENTS) ×2 IMPLANT
SET HNDPC FAN SPRY TIP SCT (DISPOSABLE) ×1 IMPLANT
SHELL ACETAB 54 4H HIP (Shell) IMPLANT
SOLUTION PRONTOSAN WOUND 350ML (IRRIGATION / IRRIGATOR) ×2 IMPLANT
SPIKE FLUID TRANSFER (MISCELLANEOUS) ×2 IMPLANT
STEM FEM REDUCE DISTAL 17X119 (Stem) ×1 IMPLANT
SUT MNCRL AB 3-0 PS2 18 (SUTURE) ×2 IMPLANT
SUT MON AB 2-0 CT1 36 (SUTURE) ×2 IMPLANT
SUT STRATAFIX PDO 1 14 VIOLET (SUTURE) ×1
SUT STRATFX PDO 1 14 VIOLET (SUTURE) ×1
SUT VIC AB 2-0 CT1 27 (SUTURE) ×1
SUT VIC AB 2-0 CT1 TAPERPNT 27 (SUTURE) IMPLANT
SUTURE STRATFX PDO 1 14 VIOLET (SUTURE) ×1 IMPLANT
SYR 3ML LL SCALE MARK (SYRINGE) ×2 IMPLANT
TRAY FOLEY MTR SLVR 16FR STAT (SET/KITS/TRAYS/PACK) ×1 IMPLANT
TUBE SUCTION HIGH CAP CLEAR NV (SUCTIONS) ×2 IMPLANT
WATER STERILE IRR 1000ML POUR (IV SOLUTION) ×2 IMPLANT

## 2022-05-30 NOTE — Anesthesia Preprocedure Evaluation (Addendum)
Anesthesia Evaluation  Patient identified by MRN, date of birth, ID band Patient awake    Reviewed: Allergy & Precautions, NPO status , Patient's Chart, lab work & pertinent test results  History of Anesthesia Complications (+) PONV and history of anesthetic complications  Airway Mallampati: II  TM Distance: >3 FB Neck ROM: Full    Dental  (+) Dental Advisory Given, Partial Upper, Partial Lower, Poor Dentition   Pulmonary asthma ,    Pulmonary exam normal breath sounds clear to auscultation       Cardiovascular hypertension, Pt. on medications Normal cardiovascular exam Rhythm:Regular Rate:Normal     Neuro/Psych PSYCHIATRIC DISORDERS Anxiety Depression  Neuromuscular disease    GI/Hepatic Neg liver ROS, GERD  Medicated,  Endo/Other  negative endocrine ROS  Renal/GU negative Renal ROS     Musculoskeletal  (+) Arthritis , Osteoarthritis,    Abdominal   Peds  Hematology Plt 227k   Anesthesia Other Findings Day of surgery medications reviewed with the patient.  Reproductive/Obstetrics                            Anesthesia Physical Anesthesia Plan  ASA: 2  Anesthesia Plan: Spinal   Post-op Pain Management: Tylenol PO (pre-op)*   Induction: Intravenous  PONV Risk Score and Plan: 3 and TIVA, Dexamethasone and Ondansetron  Airway Management Planned: Natural Airway and Nasal Cannula  Additional Equipment:   Intra-op Plan:   Post-operative Plan:   Informed Consent: I have reviewed the patients History and Physical, chart, labs and discussed the procedure including the risks, benefits and alternatives for the proposed anesthesia with the patient or authorized representative who has indicated his/her understanding and acceptance.     Dental advisory given  Plan Discussed with: CRNA, Anesthesiologist and Surgeon  Anesthesia Plan Comments:        Anesthesia Quick Evaluation

## 2022-05-30 NOTE — Anesthesia Postprocedure Evaluation (Signed)
Anesthesia Post Note  Patient: Marie Calhoun  Procedure(s) Performed: TOTAL HIP ARTHROPLASTY ANTERIOR APPROACH (Right: Hip)     Patient location during evaluation: PACU Anesthesia Type: Spinal Level of consciousness: awake, awake and alert and oriented Pain management: pain level controlled Vital Signs Assessment: post-procedure vital signs reviewed and stable Respiratory status: spontaneous breathing, nonlabored ventilation and respiratory function stable Cardiovascular status: blood pressure returned to baseline and stable Postop Assessment: no headache, no backache, spinal receding and no apparent nausea or vomiting Anesthetic complications: no   No notable events documented.  Last Vitals:  Vitals:   05/30/22 1400 05/30/22 1415  BP:  (!) 150/84  Pulse: 61 61  Resp: 16   Temp:    SpO2: 96% 96%    Last Pain:  Vitals:   05/30/22 1415  TempSrc:   PainSc: 5                  Santa Lighter

## 2022-05-30 NOTE — Transfer of Care (Signed)
Immediate Anesthesia Transfer of Care Note  Patient: Marie Calhoun  Procedure(s) Performed: TOTAL HIP ARTHROPLASTY ANTERIOR APPROACH (Right: Hip)  Patient Location: PACU  Anesthesia Type:MAC and Spinal  Level of Consciousness: awake, alert  and oriented  Airway & Oxygen Therapy: Patient Spontanous Breathing  Post-op Assessment: Report given to RN and Post -op Vital signs reviewed and stable  Post vital signs: Reviewed and stable  Last Vitals:  Vitals Value Taken Time  BP 101/70 05/30/22 1207  Temp    Pulse 70 05/30/22 1209  Resp 29 05/30/22 1209  SpO2 93 % 05/30/22 1209  Vitals shown include unvalidated device data.  Last Pain:  Vitals:   05/30/22 0834  TempSrc:   PainSc: 6          Complications: No notable events documented.

## 2022-05-30 NOTE — Evaluation (Signed)
Physical Therapy Evaluation Patient Details Name: Marie Calhoun MRN: [id number] DOB: 08/07/1951 Today's Date: 05/30/2022  History of Present Illness  Pt is a 71yo female presenting s/p R-THAAA on 05/30/22. PMH: Anxiety & depression, Carcot-Marie-Tooth disease, GERD, Lupus, PONV.   Clinical Impression  Marie Calhoun is a 70 y.o. female POD0 s/p R-THA, AA. Patient reports modified independence using single loftstrand crutch and bilateral AFOs for community mobility at baseline; at home pt reports she "furniture surfs." Patient is now limited by functional impairments (see PT problem list below) and requires min assist for bed mobility and transfers. Patient was able to ambulate 30 feet with RW and min assist; at end of ambulation reporting "10+/10" pain, RN notified. Patient instructed in exercise to facilitate ROM and circulation to manage edema. Patient will benefit from continued skilled PT interventions to address impairments and progress towards PLOF. Acute PT will follow to progress mobility and HEP in preparation for safe discharge home.       Recommendations for follow up therapy are one component of a multi-disciplinary discharge planning process, led by the attending physician.  Recommendations may be updated based on patient status, additional functional criteria and insurance authorization.  Follow Up Recommendations Follow physician's recommendations for discharge plan and follow up therapies      Assistance Recommended at Discharge Intermittent Supervision/Assistance  Patient can return home with the following  A little help with walking and/or transfers;A little help with bathing/dressing/bathroom;Assistance with cooking/housework;Assist for transportation;Help with stairs or ramp for entrance    Equipment Recommendations Rolling walker (2 wheels)  Recommendations for Other Services       Functional Status Assessment Patient has had a recent decline in their functional status  and demonstrates the ability to make significant improvements in function in a reasonable and predictable amount of time.     Precautions / Restrictions Precautions Precautions: Fall Restrictions Weight Bearing Restrictions: No Other Position/Activity Restrictions: WBAT      Mobility  Bed Mobility Overal bed mobility: Needs Assistance Bed Mobility: Supine to Sit     Supine to sit: Min assist     General bed mobility comments: Pt min assist for bringing RLE off bed, max encouragement as pt crying and grimacing in pain.    Transfers Overall transfer level: Needs assistance Equipment used: Rolling walker (2 wheels) Transfers: Sit to/from Stand Sit to Stand: Min assist           General transfer comment: Min assist for steadying, pt demonstrating high degree of R knee flexion in stance, reports PTA. Completed transfer x2.    Ambulation/Gait Ambulation/Gait assistance: Min assist Gait Distance (Feet): 30 Feet Assistive device: Rolling walker (2 wheels) Gait Pattern/deviations: Step-to pattern, Knee flexed in stance - right, Decreased weight shift to right, Decreased stride length, Decreased dorsiflexion - left, Decreased dorsiflexion - right Gait velocity: decreased     General Gait Details: Pt ambulated 35f with RW and min assist for steadying, no overt LOB, pt reporting high degree of pain and crying, further mobility deferred  Stairs            Wheelchair Mobility    Modified Rankin (Stroke Patients Only)       Balance Overall balance assessment: Needs assistance Sitting-balance support: Feet supported, No upper extremity supported Sitting balance-Leahy Scale: Fair     Standing balance support: Reliant on assistive device for balance, During functional activity, Bilateral upper extremity supported Standing balance-Leahy Scale: Poor  Pertinent Vitals/Pain Pain Assessment Pain Assessment: 0-10 Pain Score:  0-No pain Pain Location: R hip Pain Descriptors / Indicators: Operative site guarding, Cramping, Crying, Discomfort, Grimacing, Guarding, Moaning Pain Intervention(s): Limited activity within patient's tolerance, Monitored during session, Repositioned, Patient requesting pain meds-RN notified, Ice applied    Home Living Family/patient expects to be discharged to:: Private residence Living Arrangements: Alone Available Help at Discharge: Friend(s);Available 24 hours/day Type of Home: House Home Access: Level entry       Home Layout: One level Home Equipment: Cane - single point;Other (comment);Rollator (4 wheels);BSC/3in1 (AFOs bilaterally) Additional Comments: Home environment pertains to friend's house where pt will be staying until next week. Pt's home has one step entry with a railing, one story with W/D in the basement. Tub/shower with shower chair with handheld shower head.    Prior Function Prior Level of Function : Independent/Modified Independent             Mobility Comments: Uses SPC and bilateral AFOs for community distance, furniture surfs at home ADLs Comments: ind     Hand Dominance        Extremity/Trunk Assessment   Upper Extremity Assessment Upper Extremity Assessment: Overall WFL for tasks assessed    Lower Extremity Assessment Lower Extremity Assessment: RLE deficits/detail;LLE deficits/detail RLE Deficits / Details: MMT ank DF/PF 4/5. RLE Sensation: WNL LLE Deficits / Details: MMT ank DF/PF 4/5. LLE Sensation: WNL    Cervical / Trunk Assessment Cervical / Trunk Assessment: Kyphotic  Communication   Communication: No difficulties  Cognition Arousal/Alertness: Awake/alert Behavior During Therapy: WFL for tasks assessed/performed Overall Cognitive Status: Within Functional Limits for tasks assessed                                          General Comments      Exercises Total Joint Exercises Ankle Circles/Pumps: AROM,  Both, 10 reps, Supine   Assessment/Plan    PT Assessment Patient needs continued PT services  PT Problem List Decreased strength;Decreased range of motion;Decreased activity tolerance;Decreased balance;Decreased mobility;Decreased coordination;Decreased knowledge of use of DME;Pain       PT Treatment Interventions DME instruction;Gait training;Stair training;Functional mobility training;Therapeutic activities;Therapeutic exercise;Balance training;Neuromuscular re-education;Patient/family education    PT Goals (Current goals can be found in the Care Plan section)  Acute Rehab PT Goals Patient Stated Goal: Get my hip healed so I can get a knee replacement PT Goal Formulation: With patient Time For Goal Achievement: 06/06/22 Potential to Achieve Goals: Good    Frequency 7X/week     Co-evaluation               AM-PAC PT "6 Clicks" Mobility  Outcome Measure Help needed turning from your back to your side while in a flat bed without using bedrails?: A Little Help needed moving from lying on your back to sitting on the side of a flat bed without using bedrails?: A Little Help needed moving to and from a bed to a chair (including a wheelchair)?: A Little Help needed standing up from a chair using your arms (e.g., wheelchair or bedside chair)?: A Little Help needed to walk in hospital room?: A Little Help needed climbing 3-5 steps with a railing? : A Lot 6 Click Score: 17    End of Session Equipment Utilized During Treatment: Gait belt Activity Tolerance: Patient limited by pain Patient left: in chair;with call bell/phone within reach;with nursing/sitter in  room;with family/visitor present Nurse Communication: Mobility status;Patient requests pain meds PT Visit Diagnosis: Pain;Difficulty in walking, not elsewhere classified (R26.2) Pain - Right/Left: Right Pain - part of body: Hip    Time: 3419-3790 PT Time Calculation (min) (ACUTE ONLY): 52 min   Charges:   PT  Evaluation $PT Eval Low Complexity: 1 Low PT Treatments $Gait Training: 8-22 mins $Self Care/Home Management: 8-22        Coolidge Breeze, PT, DPT [facility] Department Office: [phone] Pager: [phone]  Coolidge Breeze 05/30/2022, 5:21 PM

## 2022-05-30 NOTE — Interval H&P Note (Signed)
History and Physical Interval Note:  05/30/2022 9:27 AM  Marie Calhoun  has presented today for surgery, with the diagnosis of Right hip osteoarthritis.  The various methods of treatment have been discussed with the patient and family. After consideration of risks, benefits and other options for treatment, the patient has consented to  Procedure(s): TOTAL HIP ARTHROPLASTY ANTERIOR APPROACH (Right) as a surgical intervention.  The patient's history has been reviewed, patient examined, no change in status, stable for surgery.  I have reviewed the patient's chart and labs.  Questions were answered to the patient's satisfaction.     Hilton Cork Cleota Pellerito

## 2022-05-30 NOTE — Discharge Instructions (Signed)
? ?Dr. Libbi Towner ?Joint Replacement Specialist ?[facility] ?[address] ?[place], [place] [address] ?[phone] ? ? ?TOTAL HIP REPLACEMENT POSTOPERATIVE DIRECTIONS ? ? ? ?Hip Rehabilitation, Guidelines Following Surgery  ? ?WEIGHT BEARING ?Weight bearing as tolerated with assist device (walker, cane, etc) as directed, use it as long as suggested by your surgeon or therapist, typically at least 4-6 weeks. ? ?The results of a hip operation are greatly improved after range of motion and muscle strengthening exercises. Follow all safety measures which are given to protect your hip. If any of these exercises cause increased pain or swelling in your joint, decrease the amount until you are comfortable again. Then slowly increase the exercises. Call your caregiver if you have problems or questions.  ? ?HOME CARE INSTRUCTIONS  ?Most of the following instructions are designed to prevent the dislocation of your new hip.  ?Remove items at home which could result in a fall. This includes throw rugs or furniture in walking pathways.  ?Continue medications as instructed at time of discharge. ?You may have some home medications which will be placed on hold until you complete the course of blood thinner medication. ?You may start showering once you are discharged home. Do not remove your dressing. ?Do not put on socks or shoes without following the instructions of your caregivers.   ?Sit on chairs with arms. Use the chair arms to help push yourself up when arising.  ?Arrange for the use of a toilet seat elevator so you are not sitting low.  ?Walk with walker as instructed.  ?You may resume a sexual relationship in one month or when given the OK by your caregiver.  ?Use walker as long as suggested by your caregivers.  ?You may put full weight on your legs and walk as much as is comfortable. ?Avoid periods of inactivity such as sitting longer than an hour when not asleep. This helps prevent blood  clots.  ?You may return to work once you are cleared by your surgeon.  ?Do not drive a car for 6 weeks or until released by your surgeon.  ?Do not drive while taking narcotics.  ?Wear elastic stockings for two weeks following surgery during the day but you may remove then at night.  ?Make sure you keep all of your appointments after your operation with all of your doctors and caregivers. You should call the office at the above phone number and make an appointment for approximately two weeks after the date of your surgery. ?Please pick up a stool softener and laxative for home use as long as you are requiring pain medications. ?ICE to the affected hip every three hours for 30 minutes at a time and then as needed for pain and swelling. Continue to use ice on the hip for pain and swelling from surgery. You may notice swelling that will progress down to the foot and ankle.  This is normal after surgery.  Elevate the leg when you are not up walking on it.   ?It is important for you to complete the blood thinner medication as prescribed by your doctor. ?Continue to use the breathing machine which will help keep your temperature down.  It is common for your temperature to cycle up and down following surgery, especially at night when you are not up moving around and exerting yourself.  The breathing machine keeps your lungs expanded and your temperature down. ? ?RANGE OF MOTION AND STRENGTHENING EXERCISES  ?These exercises are designed to help you   keep full movement of your hip joint. Follow your caregiver's or physical therapist's instructions. Perform all exercises about fifteen times, three times per day or as directed. Exercise both hips, even if you have had only one joint replacement. These exercises can be done on a training (exercise) mat, on the floor, on a table or on a bed. Use whatever works the best and is most comfortable for you. Use music or television while you are exercising so that the exercises are a  pleasant break in your day. This will make your life better with the exercises acting as a break in routine you can look forward to.  ?Lying on your back, slowly slide your foot toward your buttocks, raising your knee up off the floor. Then slowly slide your foot back down until your leg is straight again.  ?Lying on your back spread your legs as far apart as you can without causing discomfort.  ?Lying on your side, raise your upper leg and foot straight up from the floor as far as is comfortable. Slowly lower the leg and repeat.  ?Lying on your back, tighten up the muscle in the front of your thigh (quadriceps muscles). You can do this by keeping your leg straight and trying to raise your heel off the floor. This helps strengthen the largest muscle supporting your knee.  ?Lying on your back, tighten up the muscles of your buttocks both with the legs straight and with the knee bent at a comfortable angle while keeping your heel on the floor.  ? ?SKILLED REHAB INSTRUCTIONS: ?If the patient is transferred to a skilled rehab facility following release from the hospital, a list of the current medications will be sent to the facility for the patient to continue.  When discharged from the skilled rehab facility, please have the facility set up the patient's Home Health Physical Therapy prior to being released. Also, the skilled facility will be responsible for providing the patient with their medications at time of release from the facility to include their pain medication and their blood thinner medication. If the patient is still at the rehab facility at time of the two week follow up appointment, the skilled rehab facility will also need to assist the patient in arranging follow up appointment in our office and any transportation needs. ? ?POST-OPERATIVE OPIOID TAPER INSTRUCTIONS: ?It is important to wean off of your opioid medication as soon as possible. If you do not need pain medication after your surgery it is ok  to stop day one. ?Opioids include: ?Codeine, Hydrocodone(Norco, Vicodin), Oxycodone(Percocet, oxycontin) and hydromorphone amongst others.  ?Long term and even short term use of opiods can cause: ?Increased pain response ?Dependence ?Constipation ?Depression ?Respiratory depression ?And more.  ?Withdrawal symptoms can include ?Flu like symptoms ?Nausea, vomiting ?And more ?Techniques to manage these symptoms ?Hydrate well ?Eat regular healthy meals ?Stay active ?Use relaxation techniques(deep breathing, meditating, yoga) ?Do Not substitute Alcohol to help with tapering ?If you have been on opioids for less than two weeks and do not have pain than it is ok to stop all together.  ?Plan to wean off of opioids ?This plan should start within one week post op of your joint replacement. ?Maintain the same interval or time between taking each dose and first decrease the dose.  ?Cut the total daily intake of opioids by one tablet each day ?Next start to increase the time between doses. ?The last dose that should be eliminated is the evening dose.  ? ? ?MAKE   SURE YOU:  ?Understand these instructions.  ?Will watch your condition.  ?Will get help right away if you are not doing well or get worse. ? ?Pick up stool softner and laxative for home use following surgery while on pain medications. ?Do not remove your dressing. ?The dressing is waterproof--it is OK to take showers. ?Continue to use ice for pain and swelling after surgery. ?Do not use any lotions or creams on the incision until instructed by your surgeon. ?Total Hip Protocol. ? ?

## 2022-05-30 NOTE — Op Note (Signed)
OPERATIVE REPORT  SURGEON: Rod Can, MD   ASSISTANT: Larene Pickett, PA-C.  PREOPERATIVE DIAGNOSIS: Right hip arthritis.   POSTOPERATIVE DIAGNOSIS: Right hip arthritis.   PROCEDURE: Right total hip arthroplasty, anterior approach.   IMPLANTS: Biomet Taperloc Complete Microplasty stem, size 17 x 119 mm, high offset. Biomet G7 OsseoTi Cup, size 54 mm. Biomet Vivacit-E liner, size 36 mm, F, neutral. Biomet Biolox ceramic head ball, size 36 - 3 mm.  ANESTHESIA:  MAC and Spinal  ESTIMATED BLOOD LOSS:-200 mL    ANTIBIOTICS: 2g Ancef.  DRAINS: None.  COMPLICATIONS: None.   CONDITION: PACU - hemodynamically stable.   BRIEF CLINICAL NOTE: Marie Calhoun is a 71 y.o. female with a long-standing history of Right hip arthritis. After failing conservative management, the patient was indicated for total hip arthroplasty. The risks, benefits, and alternatives to the procedure were explained, and the patient elected to proceed.  PROCEDURE IN DETAIL: Surgical site was marked by myself in the pre-op holding area. Once inside the operating room, spinal anesthesia was obtained, and a foley catheter was inserted. The patient was then positioned on the Hana table.  All bony prominences were well padded.  The hip was prepped and draped in the normal sterile surgical fashion.  A time-out was called verifying side and site of surgery. The patient received IV antibiotics within 60 minutes of beginning the procedure.   Bikini incision was made, and superficial dissection was performed lateral to the ASIS. The direct anterior approach to the hip was performed through the Hueter interval.  Lateral femoral circumflex vessels were treated with the Auqumantys. The anterior capsule was exposed and an inverted T capsulotomy was made. The femoral neck cut was made to the level of the templated cut.  A corkscrew was placed into the head and the head was removed.  The femoral head was found to have eburnated  bone. The head was passed to the back table and was measured. Pubofemoral ligament was released off of the calcar, taking care to stay on bone. Superior capsule was released from the greater trochanter, taking care to stay lateral to the posterior border of the femoral neck in order to preserve the short external rotators.   Acetabular exposure was achieved, and the pulvinar and labrum were excised. Sequential reaming of the acetabulum was then performed up to a size 53 mm reamer. A 54 mm cup was then opened and impacted into place at approximately 40 degrees of abduction and 20 degrees of anteversion. The final polyethylene liner was impacted into place and acetabular osteophytes were removed.    I then gained femoral exposure taking care to protect the abductors and greater trochanter.  This was performed using standard external rotation, extension, and adduction.  A cookie cutter was used to enter the femoral canal, and then the femoral canal finder was placed.  Sequential broaching was performed up to a size 17.  Calcar planer was used on the femoral neck remnant.  I placed a high offset neck and a trial head ball.  The hip was reduced.  Leg lengths and offset were checked fluoroscopically.  The hip was dislocated and trial components were removed.  The final implants were placed, and the hip was reduced.  Fluoroscopy was used to confirm component position and leg lengths.  At 90 degrees of external rotation and full extension, the hip was stable to an anterior directed force.   The wound was copiously irrigated with Prontosan solution and normal saline using pule lavage.  Marcaine  solution was injected into the periarticular soft tissue.  The wound was closed in layers using #1 Vicryl and V-Loc for the fascia, 2-0 Vicryl for the subcutaneous fat, 2-0 Monocryl for the deep dermal layer,and staples + Dermabond for the skin.  Once the glue was fully dried, an Aquacell Ag dressing was applied.  The patient  was transported to the recovery room in stable condition.  Sponge, needle, and instrument counts were correct at the end of the case x2.  The patient tolerated the procedure well and there were no known complications.  Please note that a surgical assistant was a medical necessity for this procedure to perform it in a safe and expeditious manner. Assistant was necessary to provide appropriate retraction of vital neurovascular structures, to prevent femoral fracture, and to allow for anatomic placement of the prosthesis.

## 2022-05-30 NOTE — Plan of Care (Signed)
  Problem: Education: Goal: Knowledge of General Education information will improve Description: Including pain rating scale, medication(s)/side effects and non-pharmacologic comfort measures Outcome: Progressing   Problem: Clinical Measurements: Goal: Ability to maintain clinical measurements within normal limits will improve Outcome: Progressing   

## 2022-05-30 NOTE — Anesthesia Procedure Notes (Signed)
Spinal  Patient location during procedure: OR Start time: 05/30/2022 10:24 AM End time: 05/30/2022 10:27 AM Reason for block: surgical anesthesia Staffing Performed: anesthesiologist  Anesthesiologist: Santa Lighter, MD Performed by: Santa Lighter, MD Authorized by: Santa Lighter, MD   Preanesthetic Checklist Completed: patient identified, IV checked, risks and benefits discussed, surgical consent, monitors and equipment checked, pre-op evaluation and timeout performed Spinal Block Patient position: sitting Prep: DuraPrep and site prepped and draped Patient monitoring: continuous pulse ox and blood pressure Approach: midline Location: L3-4 Injection technique: single-shot Needle Needle type: Pencan  Needle gauge: 24 G Assessment Events: CSF return Additional Notes Functioning IV was confirmed and monitors were applied. Sterile prep and drape, including hand hygiene, mask and sterile gloves were used. The patient was positioned and the spine was prepped. The skin was anesthetized with lidocaine.  Free flow of clear CSF was obtained prior to injecting local anesthetic into the CSF.  The spinal needle aspirated freely following injection.  The needle was carefully withdrawn.  The patient tolerated the procedure well. Consent was obtained prior to procedure with all questions answered and concerns addressed. Risks including but not limited to bleeding, infection, nerve damage, paralysis, failed block, inadequate analgesia, allergic reaction, high spinal, itching and headache were discussed and the patient wished to proceed.   Hoy Morn, MD

## 2022-05-31 ENCOUNTER — Other Ambulatory Visit (HOSPITAL_COMMUNITY): Payer: Self-pay

## 2022-05-31 DIAGNOSIS — M1611 Unilateral primary osteoarthritis, right hip: Secondary | ICD-10-CM | POA: Diagnosis not present

## 2022-05-31 MED ORDER — OXYCODONE HCL 5 MG PO TABS
5.0000 mg | ORAL_TABLET | ORAL | 0 refills | Status: AC | PRN
  Filled 2022-05-31: qty 42, 7d supply, fill #0

## 2022-05-31 MED ORDER — METHOCARBAMOL 500 MG PO TABS: 500.0000 mg | ORAL_TABLET | Freq: Four times a day (QID) | ORAL | 0 refills | Status: DC | PRN

## 2022-05-31 MED ORDER — MELOXICAM 15 MG PO TABS: 15.0000 mg | ORAL_TABLET | Freq: Every day | ORAL | 2 refills | Status: DC

## 2022-05-31 NOTE — Progress Notes (Signed)
Physical Therapy Treatment Patient Details Name: Marie Calhoun MRN: [id number] DOB: 25-Mar-1951 Today's Date: 05/31/2022   History of Present Illness Pt is a 70yo female presenting s/p R-THAAA on 05/30/22. PMH: Anxiety & depression, Carcot-Marie-Tooth disease, GERD, Lupus, PONV.    PT Comments    Pt seen for second visit POD1.  Pt reports improved pain control and in better spirits this afternoon. Supervision for bed mobility using gait belt to self-assist, min guard for sit to stand transfers, min guard for ambulation in hallway with RW. Pt completed HEP and reviewed appropriate progression and walking program, verbalized understanding. All questions answered and education completed. Pt has met mobility goals for safe discharge home. PT is signing off, should needs change, please reconsult. Thank you for this referral.    Recommendations for follow up therapy are one component of a multi-disciplinary discharge planning process, led by the attending physician.  Recommendations may be updated based on patient status, additional functional criteria and insurance authorization.  Follow Up Recommendations  Follow physician's recommendations for discharge plan and follow up therapies     Assistance Recommended at Discharge Intermittent Supervision/Assistance  Patient can return home with the following A little help with walking and/or transfers;A little help with bathing/dressing/bathroom;Assistance with cooking/housework;Assist for transportation;Help with stairs or ramp for entrance   Equipment Recommendations  Rolling walker (2 wheels)    Recommendations for Other Services       Precautions / Restrictions Precautions Precautions: Fall Restrictions Weight Bearing Restrictions: No Other Position/Activity Restrictions: WBAT     Mobility  Bed Mobility Overal bed mobility: Needs Assistance Bed Mobility: Sit to Supine     Supine to sit: Supervision Sit to supine: Supervision    General bed mobility comments: Supervision for safety, pt gait belt to self-assist.    Transfers Overall transfer level: Needs assistance Equipment used: Rolling walker (2 wheels) Transfers: Sit to/from Stand Sit to Stand: Min guard           General transfer comment: Min guard for safety only    Ambulation/Gait Ambulation/Gait assistance: Min guard Gait Distance (Feet): 60 Feet Assistive device: Rolling walker (2 wheels) Gait Pattern/deviations: Step-to pattern, Knee flexed in stance - right, Decreased weight shift to right, Decreased stride length, Decreased dorsiflexion - left, Decreased dorsiflexion - right Gait velocity: decreased     General Gait Details: Pt ambulated 37f with RW and min assist for steadying, no overt LOB, R knee highly flexed in stance.   Stairs             Wheelchair Mobility    Modified Rankin (Stroke Patients Only)       Balance Overall balance assessment: Needs assistance Sitting-balance support: Feet supported, No upper extremity supported Sitting balance-Leahy Scale: Fair     Standing balance support: Reliant on assistive device for balance, During functional activity, Bilateral upper extremity supported Standing balance-Leahy Scale: Poor                              Cognition Arousal/Alertness: Awake/alert Behavior During Therapy: WFL for tasks assessed/performed Overall Cognitive Status: Within Functional Limits for tasks assessed                                          Exercises Total Joint Exercises Ankle Circles/Pumps: AROM, Both, 10 reps, Supine Quad Sets: AROM, Both, 10 reps Short  Arc Quad: AROM, Right, 10 reps Heel Slides: AAROM, Right, 10 reps Hip ABduction/ADduction: AROM, Both, 15 reps    General Comments        Pertinent Vitals/Pain Pain Assessment Pain Assessment: 0-10 Pain Score: 5  Pain Location: R hip Pain Descriptors / Indicators: Operative site guarding,  Cramping, Crying, Discomfort, Grimacing, Guarding, Moaning Pain Intervention(s): Monitored during session, Repositioned    Home Living                          Prior Function            PT Goals (current goals can now be found in the care plan section) Acute Rehab PT Goals Patient Stated Goal: Get my hip healed so I can get a knee replacement PT Goal Formulation: With patient Time For Goal Achievement: 06/06/22 Potential to Achieve Goals: Good Progress towards PT goals: Progressing toward goals    Frequency    7X/week      PT Plan Current plan remains appropriate    Co-evaluation              AM-PAC PT "6 Clicks" Mobility   Outcome Measure  Help needed turning from your back to your side while in a flat bed without using bedrails?: A Little Help needed moving from lying on your back to sitting on the side of a flat bed without using bedrails?: A Little Help needed moving to and from a bed to a chair (including a wheelchair)?: A Little Help needed standing up from a chair using your arms (e.g., wheelchair or bedside chair)?: A Little Help needed to walk in hospital room?: A Little Help needed climbing 3-5 steps with a railing? : A Lot 6 Click Score: 17    End of Session Equipment Utilized During Treatment: Gait belt Activity Tolerance: Patient tolerated treatment well;No increased pain Patient left: in chair;with call bell/phone within reach;with nursing/sitter in room;with family/visitor present Nurse Communication: Mobility status;Patient requests pain meds PT Visit Diagnosis: Pain;Difficulty in walking, not elsewhere classified (R26.2) Pain - Right/Left: Right Pain - part of body: Hip     Time: 1450-1520 PT Time Calculation (min) (ACUTE ONLY): 30 min  Charges:  $Gait Training: 8-22 mins $Therapeutic Exercise: 8-22 mins                     Coolidge Breeze, PT, DPT [facility] Department Office: [phone] Pager:  [phone]   Coolidge Breeze 05/31/2022, 3:20 PM

## 2022-05-31 NOTE — TOC Transition Note (Signed)
Transition of Care [facility]) - CM/SW Discharge Note  Patient Details  Name: Marie Calhoun MRN: [id number] Date of Birth: [birth year]  Transition of Care [facility]) CM/SW Contact:  Sherie Don, LCSW Phone Number: 05/31/2022, 12:11 PM  Clinical Narrative: Patient is expected to discharge home after working with PT. CSW met with patient to confirm discharge plan and needs. Patient will go home with a home exercise program (HEP). Patient will need a rolling walker. CSW made DME referral to [place] with [employer]. [employer] to deliver rolling walker to patient's room. TOC signing off.    Final next level of care: Home/Self Care Barriers to Discharge: No Barriers Identified  Patient Goals and CMS Choice Patient states their goals for this hospitalization and ongoing recovery are:: Discharge home with HEP CMS Medicare.gov Compare Post Acute Care list provided to:: Patient Choice offered to / list presented to : Patient  Discharge Plan and Services         DME Arranged: Walker rolling DME Agency: [employer] Date DME Agency Contacted: 05/31/22 Time DME Agency Contacted: 1205 Representative spoke with at DME Agency: [place]  Readmission Risk Interventions     No data to display

## 2022-05-31 NOTE — Progress Notes (Signed)
    Subjective:  Patient reports pain as mild to moderate.  Denies N/V/CP/SOB/Abd pain. She is doing well this morning. She reports some pain over her incision and when she tries to flex her thigh. We discussed this is normal in the postoperative period. She denies new tingling and numbness in LE bilaterally.   Objective:   VITALS:   Vitals:   05/30/22 2241 05/30/22 2340 05/31/22 0056 05/31/22 0554  BP: 130/79 138/83 126/87 (!) 153/88  Pulse: 72 83 87 79  Resp: '18 16 18 18  '$ Temp: 98.7 F (37.1 C) 98.4 F (36.9 C) 98.6 F (37 C) 98.6 F (37 C)  TempSrc: Oral Oral Oral Oral  SpO2: 97% 95% 91% 93%  Weight:      Height:       Patient lying comfortably in bed.  NAD Neurologically intact ABD soft Neurovascular intact Intact pulses distally Dorsiflexion/Plantar flexion intact Incision: dressing C/D/I No cellulitis present Compartment soft Sensation is at baseline with CMT.   Lab Results  Component Value Date   WBC 5.3 05/22/2022   HGB 13.6 05/22/2022   HCT 40.3 05/22/2022   MCV 98.8 05/22/2022   PLT 227 05/22/2022   BMET    Component Value Date/Time   NA 141 05/22/2022 1441   K 3.1 (L) 05/22/2022 1441   CL 108 05/22/2022 1441   CO2 26 05/22/2022 1441   GLUCOSE 94 05/22/2022 1441   BUN 15 05/22/2022 1441   CREATININE 1.11 (H) 05/22/2022 1441   CALCIUM 9.3 05/22/2022 1441   GFRNONAA 54 (L) 05/22/2022 1441     Assessment/Plan: 1 Day Post-Op   Principal Problem:   Osteoarthritis of right hip Active Problems:   S/P total right hip arthroplasty   WBAT with walker DVT ppx: Aspirin, SCDs, TEDS PO pain control PT/OT: She ambulated with PT 30 feet yesterday but was having trouble standing from a seated position. Continue to work with today. Patient has CMT at baseline with bilateral AFOs.  Dispo: D/c home with HEP once cleared with PT. Patient states her pharmacy might be out of medication. She states she is going to call around and ask about availability. She  will follow-up in office in 2 weeks.    Charlott Rakes, PA-C 05/31/2022, 7:14 AM  [facility]  [place] [address], [place], [place] [address]

## 2022-05-31 NOTE — Plan of Care (Signed)
  Problem: Education: Goal: Knowledge of the prescribed therapeutic regimen will improve Outcome: Progressing   Problem: Activity: Goal: Ability to avoid complications of mobility impairment will improve Outcome: Progressing   Problem: Pain Management: Goal: Pain level will decrease with appropriate interventions Outcome: Progressing   

## 2022-06-03 ENCOUNTER — Encounter (HOSPITAL_COMMUNITY): Payer: Self-pay | Admitting: Orthopedic Surgery

## 2022-06-04 NOTE — Discharge Summary (Signed)
Physician Discharge Summary  Patient ID: Marie Calhoun MRN: [id number] DOB/AGE: 1951/06/15 71 y.o.  Admit date: 05/30/2022 Discharge date: 05/31/22  Admission Diagnoses:  Osteoarthritis of right hip  Discharge Diagnoses:  Principal Problem:   Osteoarthritis of right hip Active Problems:   S/P total right hip arthroplasty   Past Medical History:  Diagnosis Date   Anxiety    Arthritis    Asthma 05/28/2017   Cancer ([place])    Charcot-Marie-Tooth disease    Depression    Environmental allergies 05/28/2017   Esophageal stricture 05/28/2017   Essential hypertension, benign 05/28/2017   GERD (gastroesophageal reflux disease) 05/28/2017   Hypercholesteremia    Lupus ([place])    of skin   PONV (postoperative nausea and vomiting)    Seasonal allergies     Surgeries: Procedure(s): TOTAL HIP ARTHROPLASTY ANTERIOR APPROACH on 05/30/2022   Consultants (if any):   Discharged Condition: Improved  Hospital Course: Marie Calhoun is an 71 y.o. female who was admitted 05/30/2022 with a diagnosis of Osteoarthritis of right hip and went to the operating room on 05/30/2022 and underwent the above named procedures.    She was given perioperative antibiotics:  Anti-infectives (From admission, onward)    Start     Dose/Rate Route Frequency Ordered Stop   05/30/22 1411  ceFAZolin (ANCEF) 2-4 GM/100ML-% IVPB       Note to Pharmacy: Lanell Persons J: cabinet override      05/30/22 1411 05/30/22 2155   05/30/22 1215  ceFAZolin (ANCEF) IVPB 2g/100 mL premix        2 g 200 mL/hr over 30 Minutes Intravenous Every 6 hours 05/30/22 1209 05/30/22 2228   05/30/22 0815  ceFAZolin (ANCEF) IVPB 2g/100 mL premix        2 g 200 mL/hr over 30 Minutes Intravenous On call to O.R. 05/30/22 0806 05/30/22 1029       She was given sequential compression devices, early ambulation, and aspirin for DVT prophylaxis.  She ambulated with PT 30 feet day of surgery but was having trouble standing from a seated  position. Patient has CMT at baseline with bilateral AFOs. Patient was kept overnight.   Postoperative day 1 her pain had improved and she was able to ambulate 60 feet and cleared therapy to be discharged home.   She benefited maximally from the hospital stay and there were no complications.    Recent vital signs:  Vitals:   05/31/22 1024 05/31/22 1344  BP: 137/76 122/70  Pulse: 80 88  Resp:  14  Temp: 97.8 F (36.6 C) 97.9 F (36.6 C)  SpO2: 98% 97%    Recent laboratory studies:  Lab Results  Component Value Date   HGB 13.6 05/22/2022   Lab Results  Component Value Date   WBC 5.3 05/22/2022   PLT 227 05/22/2022   No results found for: "INR" Lab Results  Component Value Date   NA 141 05/22/2022   K 3.1 (L) 05/22/2022   CL 108 05/22/2022   CO2 26 05/22/2022   BUN 15 05/22/2022   CREATININE 1.11 (H) 05/22/2022   GLUCOSE 94 05/22/2022     Allergies as of 05/31/2022       Reactions   Doxycycline Itching, Rash   Demerol [meperidine] Nausea Only   nausea   Other Other (See Comments)   Not able to take aspirin, ibuprofen, and inflammatory meds due to ulcers in stomach   Pantoprazole Other (See Comments)   Subacute Cutaneous Lupus of the Skin   Plaquenil [  hydroxychloroquine]    Caused another Rash on top of Lupus Rash   Sulfa Antibiotics Swelling   *Childhood*   Sulfamethoxazole-trimethoprim Other (See Comments)   Other reaction(s): Unknown   Ranitidine Rash        Medication List     STOP taking these medications    methotrexate 2.5 MG tablet Commonly known as: RHEUMATREX       TAKE these medications    acetaminophen 500 MG tablet Commonly known as: TYLENOL Take 1,000 mg by mouth in the morning.   acyclovir 400 MG tablet Commonly known as: ZOVIRAX Take 400 mg by mouth 3 (three) times daily as needed (for cold sores/fever blisters (takes for 7 days when needed)).   albuterol 108 (90 Base) MCG/ACT inhaler Commonly known as: VENTOLIN  HFA Inhale 2 puffs into the lungs every 6 (six) hours as needed for wheezing or shortness of breath.   aspirin 81 MG chewable tablet Commonly known as: Aspirin Childrens Chew 1 tablet (81 mg total) by mouth 2 (two) times daily with a meal.   CALCIUM 600 + D PO Take 1,200 mg by mouth in the morning.   cycloSPORINE 0.05 % ophthalmic emulsion Commonly known as: RESTASIS Place 1 drop into both eyes 2 (two) times daily.   docusate sodium 100 MG capsule Commonly known as: Colace Take 1 capsule (100 mg total) by mouth 2 (two) times daily.   famotidine 20 MG tablet Commonly known as: PEPCID TAKE 1 TABLET TWICE DAILY What changed:  when to take this reasons to take this   Fish Oil 1000 MG Caps Take 1,000 mg by mouth in the morning.   fluticasone 50 MCG/ACT nasal spray Commonly known as: FLONASE Place 1-2 sprays into both nostrils daily as needed for allergies.   folic acid 1 MG tablet Commonly known as: FOLVITE Take 1 mg by mouth daily.   gabapentin 300 MG capsule Commonly known as: NEURONTIN Take 300 mg by mouth in the morning, at noon, and at bedtime.   hydrOXYzine 25 MG tablet Commonly known as: ATARAX Take 25 mg by mouth at bedtime as needed for itching.   ketoconazole 2 % cream Commonly known as: NIZORAL Apply 1 Application topically daily. Apply to nail and finger around affected area   lansoprazole 30 MG capsule Commonly known as: PREVACID TAKE 1 CAPSULE BY MOUTH ONCE DAILY ON AN EMPTY STOMACH, 30-45 MINUTES BEFORE BREAKFAST   linaclotide 72 MCG capsule Commonly known as: Linzess TAKE 1 CAPSULE EVERY DAY BEFORE BREAKFAST   losartan-hydrochlorothiazide 50-12.5 MG tablet Commonly known as: HYZAAR Take 0.5 tablets by mouth at bedtime.   meloxicam 15 MG tablet Commonly known as: MOBIC Take 1 tablet (15 mg total) by mouth daily.   methocarbamol 500 MG tablet Commonly known as: ROBAXIN Take 1 tablet (500 mg total) by mouth every 6 (six) hours as needed for  muscle spasms.   mineral oil light external liquid Apply 1 Application topically daily as needed (In ears of needed).   multivitamin with minerals Tabs tablet Take 1 tablet by mouth in the morning. Woman's   ondansetron 4 MG tablet Commonly known as: Zofran Take 1 tablet (4 mg total) by mouth every 8 (eight) hours as needed for nausea or vomiting.   oxyCODONE 5 MG immediate release tablet Commonly known as: Roxicodone Take 1 tablet by mouth every 4 hours as needed for up to 7 days for severe pain or moderate pain.   polyethylene glycol 17 g packet Commonly known as: MiraLax Take  17 g by mouth daily as needed for moderate constipation or severe constipation.   senna 8.6 MG Tabs tablet Commonly known as: SENOKOT Take 2 tablets (17.2 mg total) by mouth at bedtime.   silver sulfADIAZINE 1 % cream Commonly known as: SILVADENE Apply 1 application topically daily as needed (foot wounds/sores).   triamcinolone ointment 0.5 % Commonly known as: KENALOG Apply 1 application topically 2 (two) times daily as needed (skin itching/irritation).   vitamin B-12 1000 MCG tablet Commonly known as: CYANOCOBALAMIN Take 2,000 mcg by mouth in the morning.               Discharge Care Instructions  (From admission, onward)           Start     Ordered   05/31/22 0000  Weight bearing as tolerated        05/31/22 0722   05/31/22 0000  Change dressing       Comments: Do not remove your dressing.   05/31/22 0722   05/31/22 0000  Change dressing       Comments: Do not change your dressing.   05/31/22 0722              WEIGHT BEARING   Weight bearing as tolerated with assist device (walker, cane, etc) as directed, use it as long as suggested by your surgeon or therapist, typically at least 4-6 weeks.   EXERCISES  Results after joint replacement surgery are often greatly improved when you follow the exercise, range of motion and muscle strengthening exercises prescribed by  your doctor. Safety measures are also important to protect the joint from further injury. Any time any of these exercises cause you to have increased pain or swelling, decrease what you are doing until you are comfortable again and then slowly increase them. If you have problems or questions, call your caregiver or physical therapist for advice.   Rehabilitation is important following a joint replacement. After just a few days of immobilization, the muscles of the leg can become weakened and shrink (atrophy).  These exercises are designed to build up the tone and strength of the thigh and leg muscles and to improve motion. Often times heat used for twenty to thirty minutes before working out will loosen up your tissues and help with improving the range of motion but do not use heat for the first two weeks following surgery (sometimes heat can increase post-operative swelling).   These exercises can be done on a training (exercise) mat, on the floor, on a table or on a bed. Use whatever works the best and is most comfortable for you.    Use music or television while you are exercising so that the exercises are a pleasant break in your day. This will make your life better with the exercises acting as a break in your routine that you can look forward to.   Perform all exercises about fifteen times, three times per day or as directed.  You should exercise both the operative leg and the other leg as well.  Exercises include:   Quad Sets - Tighten up the muscle on the front of the thigh (Quad) and hold for 5-10 seconds.   Straight Leg Raises - With your knee straight (if you were given a brace, keep it on), lift the leg to 60 degrees, hold for 3 seconds, and slowly lower the leg.  Perform this exercise against resistance later as your leg gets stronger.  Leg Slides: Lying on your  back, slowly slide your foot toward your buttocks, bending your knee up off the floor (only go as far as is comfortable). Then slowly  slide your foot back down until your leg is flat on the floor again.  Angel Wings: Lying on your back spread your legs to the side as far apart as you can without causing discomfort.  Hamstring Strength:  Lying on your back, push your heel against the floor with your leg straight by tightening up the muscles of your buttocks.  Repeat, but this time bend your knee to a comfortable angle, and push your heel against the floor.  You may put a pillow under the heel to make it more comfortable if necessary.   A rehabilitation program following joint replacement surgery can speed recovery and prevent re-injury in the future due to weakened muscles. Contact your doctor or a physical therapist for more information on knee rehabilitation.    CONSTIPATION  Constipation is defined medically as fewer than three stools per week and severe constipation as less than one stool per week.  Even if you have a regular bowel pattern at home, your normal regimen is likely to be disrupted due to multiple reasons following surgery.  Combination of anesthesia, postoperative narcotics, change in appetite and fluid intake all can affect your bowels.   YOU MUST use at least one of the following options; they are listed in order of increasing strength to get the job done.  They are all available over the counter, and you may need to use some, POSSIBLY even all of these options:    Drink plenty of fluids (prune juice may be helpful) and high fiber foods Colace 100 mg by mouth twice a day  Senokot for constipation as directed and as needed Dulcolax (bisacodyl), take with full glass of water  Miralax (polyethylene glycol) once or twice a day as needed.  If you have tried all these things and are unable to have a bowel movement in the first 3-4 days after surgery call either your surgeon or your primary doctor.    If you experience loose stools or diarrhea, hold the medications until you stool forms back up.  If your symptoms do  not get better within 1 week or if they get worse, check with your doctor.  If you experience "the worst abdominal pain ever" or develop nausea or vomiting, please contact the office immediately for further recommendations for treatment.   ITCHING:  If you experience itching with your medications, try taking only a single pain pill, or even half a pain pill at a time.  You can also use Benadryl over the counter for itching or also to help with sleep.   TED HOSE STOCKINGS:  Use stockings on both legs until for at least 2 weeks or as directed by physician office. They may be removed at night for sleeping.  MEDICATIONS:  See your medication summary on the "After Visit Summary" that nursing will review with you.  You may have some home medications which will be placed on hold until you complete the course of blood thinner medication.  It is important for you to complete the blood thinner medication as prescribed.  PRECAUTIONS:  If you experience chest pain or shortness of breath - call 911 immediately for transfer to the hospital emergency department.   If you develop a fever greater that 101 F, purulent drainage from wound, increased redness or drainage from wound, foul odor from the wound/dressing, or calf pain -  CONTACT YOUR SURGEON.                                                   FOLLOW-UP APPOINTMENTS:  If you do not already have a post-op appointment, please call the office for an appointment to be seen by your surgeon.  Guidelines for how soon to be seen are listed in your "After Visit Summary", but are typically between 1-4 weeks after surgery.     MAKE SURE YOU:  Understand these instructions.  Get help right away if you are not doing well or get worse.    Thank you for letting us be a part of your medical care team.  It is a privilege we respect greatly.  We hope these instructions will help you stay on track for a fast and full recovery!   Diagnostic Studies: DG C-Arm 1-60 Min-No  Report  Result Date: 05/30/2022 CLINICAL DATA:  RIGHT hip arthroplasty. 1.31 mGy, 8 sec fluoro EXAM: DG HIP (WITH OR WITHOUT PELVIS) 1V RIGHT; DG C-ARM 1-60 MIN-NO REPORT COMPARISON:  None Available. FINDINGS: Intraoperative image demonstrates RIGHT hip arthroplasty. The components appear well seated, without acute fracture. IMPRESSION: Intraoperative images of RIGHT hip arthroplasty. No adverse features. Electronically Signed   By: Nolon Nations M.D.   On: 05/30/2022 12:48   DG HIP UNILAT WITH PELVIS 1V RIGHT  Result Date: 05/30/2022 CLINICAL DATA:  RIGHT hip arthroplasty. 1.31 mGy, 8 sec fluoro EXAM: DG HIP (WITH OR WITHOUT PELVIS) 1V RIGHT; DG C-ARM 1-60 MIN-NO REPORT COMPARISON:  None Available. FINDINGS: Intraoperative image demonstrates RIGHT hip arthroplasty. The components appear well seated, without acute fracture. IMPRESSION: Intraoperative images of RIGHT hip arthroplasty. No adverse features. Electronically Signed   By: Nolon Nations M.D.   On: 05/30/2022 12:48   DG C-Arm 1-60 Min-No Report  Result Date: 05/30/2022 CLINICAL DATA:  RIGHT hip arthroplasty. 1.31 mGy, 8 sec fluoro EXAM: DG HIP (WITH OR WITHOUT PELVIS) 1V RIGHT; DG C-ARM 1-60 MIN-NO REPORT COMPARISON:  None Available. FINDINGS: Intraoperative image demonstrates RIGHT hip arthroplasty. The components appear well seated, without acute fracture. IMPRESSION: Intraoperative images of RIGHT hip arthroplasty. No adverse features. Electronically Signed   By: Nolon Nations M.D.   On: 05/30/2022 12:48   DG Pelvis Portable  Result Date: 05/30/2022 CLINICAL DATA:  Postop hip. EXAM: PORTABLE PELVIS 1-2 VIEWS COMPARISON:  05/30/2022 FINDINGS: Status post RIGHT hip arthroplasty. The acetabular component and femoral head component appear well seated. No interval fracture. Postoperative surgical clips and soft tissue gas as expected. Mild degenerative changes in the LEFT hip. IMPRESSION: Postoperative changes after RIGHT total hip  arthroplasty. Electronically Signed   By: Nolon Nations M.D.   On: 05/30/2022 12:45    Disposition: Discharge disposition: 01-Home or Self Care       Discharge Instructions     Call MD / Call 911   Complete by: As directed    If you experience chest pain or shortness of breath, CALL 911 and be transported to the hospital emergency room.  If you develope a fever above 101 F, pus (white drainage) or increased drainage or redness at the wound, or calf pain, call your surgeon's office.   Call MD / Call 911   Complete by: As directed    If you experience chest pain or shortness of breath, CALL 911 and be  transported to the hospital emergency room.  If you develope a fever above 101 F, pus (white drainage) or increased drainage or redness at the wound, or calf pain, call your surgeon's office.   Change dressing   Complete by: As directed    Do not remove your dressing.   Change dressing   Complete by: As directed    Do not change your dressing.   Constipation Prevention   Complete by: As directed    Drink plenty of fluids.  Prune juice may be helpful.  You may use a stool softener, such as Colace (over the counter) 100 mg twice a day.  Use MiraLax (over the counter) for constipation as needed.   Constipation Prevention   Complete by: As directed    Drink plenty of fluids.  Prune juice may be helpful.  You may use a stool softener, such as Colace (over the counter) 100 mg twice a day.  Use MiraLax (over the counter) for constipation as needed.   Diet - low sodium heart healthy   Complete by: As directed    Discharge instructions   Complete by: As directed    Elevate toes above nose. Use cryotherapy as needed for pain and swelling.   Driving restrictions   Complete by: As directed    No driving for 6 weeks   Driving restrictions   Complete by: As directed    No driving for 6 weeks   Increase activity slowly as tolerated   Complete by: As directed    Increase activity slowly as  tolerated   Complete by: As directed    Lifting restrictions   Complete by: As directed    No lifting for 6 weeks   Lifting restrictions   Complete by: As directed    No lifting for 6 weeks   Post-operative opioid taper instructions:   Complete by: As directed    POST-OPERATIVE OPIOID TAPER INSTRUCTIONS: It is important to wean off of your opioid medication as soon as possible. If you do not need pain medication after your surgery it is ok to stop day one. Opioids include: Codeine, Hydrocodone(Norco, Vicodin), Oxycodone(Percocet, oxycontin) and hydromorphone amongst others.  Long term and even short term use of opiods can cause: Increased pain response Dependence Constipation Depression Respiratory depression And more.  Withdrawal symptoms can include Flu like symptoms Nausea, vomiting And more Techniques to manage these symptoms Hydrate well Eat regular healthy meals Stay active Use relaxation techniques(deep breathing, meditating, yoga) Do Not substitute Alcohol to help with tapering If you have been on opioids for less than two weeks and do not have pain than it is ok to stop all together.  Plan to wean off of opioids This plan should start within one week post op of your joint replacement. Maintain the same interval or time between taking each dose and first decrease the dose.  Cut the total daily intake of opioids by one tablet each day Next start to increase the time between doses. The last dose that should be eliminated is the evening dose.      Post-operative opioid taper instructions:   Complete by: As directed    POST-OPERATIVE OPIOID TAPER INSTRUCTIONS: It is important to wean off of your opioid medication as soon as possible. If you do not need pain medication after your surgery it is ok to stop day one. Opioids include: Codeine, Hydrocodone(Norco, Vicodin), Oxycodone(Percocet, oxycontin) and hydromorphone amongst others.  Long term and even short term use of  opiods  can cause: Increased pain response Dependence Constipation Depression Respiratory depression And more.  Withdrawal symptoms can include Flu like symptoms Nausea, vomiting And more Techniques to manage these symptoms Hydrate well Eat regular healthy meals Stay active Use relaxation techniques(deep breathing, meditating, yoga) Do Not substitute Alcohol to help with tapering If you have been on opioids for less than two weeks and do not have pain than it is ok to stop all together.  Plan to wean off of opioids This plan should start within one week post op of your joint replacement. Maintain the same interval or time between taking each dose and first decrease the dose.  Cut the total daily intake of opioids by one tablet each day Next start to increase the time between doses. The last dose that should be eliminated is the evening dose.      TED hose   Complete by: As directed    Use stockings (TED hose) for 2 weeks on both leg(s).  You may remove them at night for sleeping.   TED hose   Complete by: As directed    Use stockings (TED hose) for 2 weeks on both leg(s).  You may remove them at night for sleeping.   Weight bearing as tolerated   Complete by: As directed         Follow-up Information     Swinteck, Aaron Edelman, MD. Schedule an appointment as soon as possible for a visit in 2 week(s).   Specialty: Orthopedic Surgery Why: For wound re-check Contact information: [address] [place] [address] [phone]                  Signed: Charlott Rakes, PA-C 06/04/2022, 11:32 AM

## 2022-06-27 ENCOUNTER — Other Ambulatory Visit (HOSPITAL_COMMUNITY): Payer: Self-pay

## 2022-07-02 ENCOUNTER — Other Ambulatory Visit (HOSPITAL_COMMUNITY): Payer: Self-pay | Admitting: *Deleted

## 2022-07-02 NOTE — Progress Notes (Addendum)
Anesthesia Review:  PCP: DR Alinda Deem Cardiologist : DR Rockne Menghini in [place]  Chest x-ray : 07/26/21- 2 view  EKG :05/01/22- on chart  Echo : Stress test: Cardiac Cath :  Activity level:  can do a flight of stairs withoiut difficulty  Sleep Study/ CPAP : none  Fasting Blood Sugar :      / Checks Blood Sugar -- times a day:   Blood Thinner/ Instructions /Last Dose: ASA / Instructions/ Last Dose :   81 mg Aspirin  Right hip 05/30/22 - pt states she had clearance from that surgery PT has walker.   Friend came with her to preop appt.  Lives alone.  Stayed with friend with hip surgery.  Will not be able to stay with her this time.  PT has requested to go to [facility] in [place] or  [place].  Have requested Transition of Care TEam Consult and placed this info in the request.  Called and LVMM on phone for Kerrie at [facility] and made her aware that pt will need to go to a facilty after knee surgery.  She is listed as Ambulatory Surgery.   Clearance for previous surgery is located under Media Tab from Dr Rockne Menghini and Dr  Loni Dolly.

## 2022-07-03 NOTE — Progress Notes (Signed)
SURGICAL WAITING ROOM VISITATION Patients having surgery or a procedure may have no more than 2 support people in the waiting area - these visitors may rotate.   Children under the age of 24 must have an adult with them who is not the patient. If the patient needs to stay at the hospital during part of their recovery, the visitor guidelines for inpatient rooms apply. Pre-op nurse will coordinate an appropriate time for 1 support person to accompany patient in pre-op.  This support person may not rotate.    Please refer to the [facility] website for the visitor guidelines for Inpatients (after your surgery is over and you are in a regular room).       Your procedure is scheduled on:  07/17/22    Report to [facility] Main Entrance    Report to admitting at   [place] AM   Call this number if you have problems the morning of surgery [phone]   Do not eat food :After Midnight.   After Midnight you may have the following liquids until _ 0430am _____ AM/  DAY OF SURGERY  Water Non-Citrus Juices (without pulp, NO RED) Carbonated Beverages Black Coffee (NO MILK/CREAM OR CREAMERS, sugar ok)  Clear Tea (NO MILK/CREAM OR CREAMERS, sugar ok) regular and decaf                             Plain Jell-O (NO RED)                                           Fruit ices (not with fruit pulp, NO RED)                                     Popsicles (NO RED)                                                               Sports drinks like Gatorade (NO RED)                   The day of surgery:  Drink ONE (1) Pre-Surgery Clear Ensure or G2 at 0430  AM  ( have completed by ) the morning of surgery. Drink in one sitting. Do not sip.  This drink was given to you during your hospital  pre-op appointment visit. Nothing else to drink after completing the  Pre-Surgery Clear Ensure or G2.      Oral Hygiene is also important to reduce your risk of infection.                                     Remember - BRUSH YOUR TEETH THE MORNING OF SURGERY WITH YOUR REGULAR TOOTHPASTE   Do NOT smoke after Midnight   Take these medicines the morning of surgery with A SIP OF WATER:  inhalers as usual and bring, eye drops as usual , gabapentin, prevacid, linzess   DO NOT TAKE ANY ORAL DIABETIC MEDICATIONS DAY  OF YOUR SURGERY  Bring CPAP mask and tubing day of surgery.                              You may not have any metal on your body including hair pins, jewelry, and body piercing             Do not wear make-up, lotions, powders, perfumes/cologne, or deodorant  Do not wear nail polish including gel and S&S, artificial/acrylic nails, or any other type of covering on natural nails including finger and toenails. If you have artificial nails, gel coating, etc. that needs to be removed by a nail salon please have this removed prior to surgery or surgery may need to be canceled/ delayed if the surgeon/ anesthesia feels like they are unable to be safely monitored.   Do not shave  48 hours prior to surgery.               Men may shave face and neck.   Do not bring valuables to the hospital. [place].   Contacts, dentures or bridgework may not be worn into surgery.   Bring small overnight bag day of surgery.   DO NOT [place]. PHARMACY WILL DISPENSE MEDICATIONS LISTED ON YOUR MEDICATION LIST TO YOU DURING YOUR ADMISSION [place]!    Patients discharged on the day of surgery will not be allowed to drive home.  Someone NEEDS to stay with you for the first 24 hours after anesthesia.   Special Instructions: Bring a copy of your healthcare power of attorney and living will documents         the day of surgery if you haven't scanned them before.              Please read over the following fact sheets you were given: IF YOU HAVE QUESTIONS ABOUT YOUR PRE-OP INSTRUCTIONS PLEASE CALL [phone]     [facility]  Health - Preparing for Surgery Before surgery, you can play an important role.  Because skin is not sterile, your skin needs to be as free of germs as possible.  You can reduce the number of germs on your skin by washing with CHG (chlorahexidine gluconate) soap before surgery.  CHG is an antiseptic cleaner which kills germs and bonds with the skin to continue killing germs even after washing. Please DO NOT use if you have an allergy to CHG or antibacterial soaps.  If your skin becomes reddened/irritated stop using the CHG and inform your nurse when you arrive at Short Stay. Do not shave (including legs and underarms) for at least 48 hours prior to the first CHG shower.  You may shave your face/neck. Please follow these instructions carefully:  1.  Shower with CHG Soap the night before surgery and the  morning of Surgery.  2.  If you choose to wash your hair, wash your hair first as usual with your  normal  shampoo.  3.  After you shampoo, rinse your hair and body thoroughly to remove the  shampoo.                           4.  Use CHG as you would any other liquid soap.  You can apply chg directly  to the  skin and wash                       Gently with a scrungie or clean washcloth.  5.  Apply the CHG Soap to your body ONLY FROM THE NECK DOWN.   Do not use on face/ open                           Wound or open sores. Avoid contact with eyes, ears mouth and genitals (private parts).                       Wash face,  Genitals (private parts) with your normal soap.             6.  Wash thoroughly, paying special attention to the area where your surgery  will be performed.  7.  Thoroughly rinse your body with warm water from the neck down.  8.  DO NOT shower/wash with your normal soap after using and rinsing off  the CHG Soap.                9.  Pat yourself dry with a clean towel.            10.  Wear clean pajamas.            11.  Place clean sheets on your bed the night of your first shower and do not   sleep with pets. Day of Surgery : Do not apply any lotions/deodorants the morning of surgery.  Please wear clean clothes to the hospital/surgery center.  FAILURE TO FOLLOW THESE INSTRUCTIONS MAY RESULT IN THE CANCELLATION OF YOUR SURGERY PATIENT SIGNATURE_________________________________  NURSE SIGNATURE__________________________________  ________________________________________________________________________

## 2022-07-04 ENCOUNTER — Encounter (HOSPITAL_COMMUNITY)
Admission: RE | Admit: 2022-07-04 | Discharge: 2022-07-04 | Disposition: A | Discharge status: Home / Self Care | Payer: Medicare Other | Source: Ambulatory Visit | Attending: Orthopedic Surgery | Admitting: Orthopedic Surgery

## 2022-07-04 ENCOUNTER — Encounter (HOSPITAL_COMMUNITY): Payer: Self-pay

## 2022-07-04 ENCOUNTER — Other Ambulatory Visit: Payer: Self-pay

## 2022-07-04 DIAGNOSIS — Z01812 Encounter for preprocedural laboratory examination: Secondary | ICD-10-CM | POA: Diagnosis present

## 2022-07-04 DIAGNOSIS — Z01818 Encounter for other preprocedural examination: Secondary | ICD-10-CM

## 2022-07-04 LAB — BASIC METABOLIC PANEL
Anion gap: 7 (ref 5–15)
BUN: 15 mg/dL (ref 8–23)
CO2: 25 mmol/L (ref 22–32)
Calcium: 9.3 mg/dL (ref 8.9–10.3)
Chloride: 108 mmol/L (ref 98–111)
Creatinine, Ser: 0.95 mg/dL (ref 0.44–1.00)
GFR, Estimated: >60 mL/min (ref >60)
Glucose, Bld: 98 mg/dL (ref 70–99)
Potassium: 3.8 mmol/L (ref 3.5–5.1)
Sodium: 140 mmol/L (ref 135–145)

## 2022-07-04 LAB — SURGICAL PCR SCREEN
MRSA, PCR: NEGATIVE
Staphylococcus aureus: NEGATIVE

## 2022-07-04 LAB — CBC
HCT: 37.4 % (ref 36.0–46.0)
Hemoglobin: 12.4 g/dL (ref 12.0–15.0)
MCH: 32.7 pg (ref 26.0–34.0)
MCHC: 33.2 g/dL (ref 30.0–36.0)
MCV: 98.7 fL (ref 80.0–100.0)
Platelets: 273 10*3/uL (ref 150–400)
RBC: 3.79 MIL/uL — ABNORMAL LOW (ref 3.87–5.11)
RDW: 12.7 % (ref 11.5–15.5)
WBC: 4.8 10*3/uL (ref 4.0–10.5)
nRBC: 0 % (ref 0.0–0.2)

## 2022-07-15 ENCOUNTER — Ambulatory Visit: Payer: Self-pay | Admitting: Student

## 2022-07-15 NOTE — H&P (Unsigned)
TOTAL KNEE ADMISSION H&P  Patient is being admitted for right total knee arthroplasty.  Subjective:  Chief Complaint:right knee pain.  HPI: Marie Calhoun, 71 y.o. female, has a history of pain and functional disability in the right knee due to arthritis and has failed non-surgical conservative treatments for greater than 12 weeks to includeNSAID's and/or analgesics, corticosteriod injections, flexibility and strengthening excercises, supervised PT with diminished ADL's post treatment, use of assistive devices, and activity modification.  Onset of symptoms was gradual, starting 10 years ago with rapidlly worsening course since that time. The patient noted no past surgery on the right knee(s).  Patient currently rates pain in the right knee(s) at 10 out of 10 with activity. Patient has night pain, worsening of pain with activity and weight bearing, pain that interferes with activities of daily living, pain with passive range of motion, crepitus, and joint swelling.  Patient has evidence of subchondral cysts, subchondral sclerosis, periarticular osteophytes, and joint space narrowing by imaging studies. There is no active infection.  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   Asthma 05/28/2017   Environmental allergies 05/28/2017   Essential hypertension, benign 05/28/2017   GERD (gastroesophageal reflux disease) 05/28/2017   Past Medical History:  Diagnosis Date   Anxiety    Arthritis    Asthma 05/28/2017   Cancer ([place])    skin cancer   Charcot-Marie-Tooth disease    Depression    Environmental allergies 05/28/2017   Esophageal stricture 05/28/2017   Essential hypertension, benign 05/28/2017   GERD  (gastroesophageal reflux disease) 05/28/2017   Hypercholesteremia    Lupus (HCC)    of skin   PONV (postoperative nausea and vomiting)    Seasonal allergies     Past Surgical History:  Procedure Laterality Date   breast tumor\     left benign   CHOLECYSTECTOMY     COLONOSCOPY WITH PROPOFOL N/A 05/30/2021   Procedure: COLONOSCOPY WITH PROPOFOL;  Surgeon: Rogene Houston, MD;  [facility];  Service: Endoscopy;  Laterality: N/A;  [place] N/A 09/11/2017   Procedure: ESOPHAGEAL DILATION;  Surgeon: Rogene Houston, MD;  Location: [facility];  Service: Endoscopy;  Laterality: N/A;   ESOPHAGEAL DILATION N/A 06/17/2018   Procedure: ESOPHAGEAL DILATION;  Surgeon: Rogene Houston, MD;  Location: [facility];  Service: Endoscopy;  Laterality: N/A;   ESOPHAGOGASTRODUODENOSCOPY N/A 09/11/2017   Procedure: ESOPHAGOGASTRODUODENOSCOPY (EGD);  Surgeon: Rogene Houston, MD;  [facility]: [facility];  Service: Endoscopy;  Laterality: N/A;  1:40 - Can NOT come earlier   ESOPHAGOGASTRODUODENOSCOPY N/A 06/17/2018   Procedure: ESOPHAGOGASTRODUODENOSCOPY (EGD);  Surgeon: Rogene Houston, MD;  [facility]: [facility];  Service: Endoscopy;  Laterality: N/A;  10:30   hand tendon release Left    knee surgeries     [place]   POLYPECTOMY  05/30/2021   Procedure: POLYPECTOMY;  Surgeon: Rogene Houston, MD;  Location: [facility];  Service: Endoscopy;;   TOTAL HIP ARTHROPLASTY Right 05/30/2022   Procedure: TOTAL HIP ARTHROPLASTY ANTERIOR APPROACH;  Surgeon: Rod Can, MD;  Location: [facility];  Service: Orthopedics;  Laterality: Right;  Current Outpatient Medications  Medication Sig Dispense Refill Last Dose   acetaminophen (TYLENOL) 500 MG tablet Take 1,000 mg by mouth in the morning.      acyclovir (ZOVIRAX) 400 MG tablet Take 400 mg by mouth 3 (three) times daily as needed (for cold sores/fever blisters  (takes for 7 days when needed)).      albuterol (VENTOLIN HFA) 108 (90 Base) MCG/ACT inhaler Inhale 2 puffs into the lungs every 6 (six) hours as needed for wheezing or shortness of breath. 18 g 3    Calcium Carb-Cholecalciferol (CALCIUM 600 + D PO) Take 2 tablets by mouth in the morning.      cycloSPORINE (RESTASIS) 0.05 % ophthalmic emulsion Place 1 drop into both eyes 2 (two) times daily.      fluticasone (FLONASE) 50 MCG/ACT nasal spray Place 1-2 sprays into both nostrils daily as needed for allergies. 16 g 3    folic acid (FOLVITE) 1 MG tablet Take 1 mg by mouth daily.      gabapentin (NEURONTIN) 300 MG capsule Take 300 mg by mouth in the morning, at noon, and at bedtime.      ketoconazole (NIZORAL) 2 % cream Apply 1 Application topically daily. Apply to nail and finger around affected area (Patient not taking: Reported on 07/01/2022)      lansoprazole (PREVACID) 30 MG capsule TAKE 1 CAPSULE BY MOUTH ONCE DAILY ON AN EMPTY STOMACH, 30-45 MINUTES BEFORE BREAKFAST 90 capsule 0    linaclotide (LINZESS) 72 MCG capsule TAKE 1 CAPSULE EVERY DAY BEFORE BREAKFAST 90 capsule 3    losartan-hydrochlorothiazide (HYZAAR) 50-12.5 MG tablet Take 0.5 tablets by mouth at bedtime.      meloxicam (MOBIC) 15 MG tablet Take 1 tablet (15 mg total) by mouth daily. 30 tablet 2    methocarbamol (ROBAXIN) 500 MG tablet Take 1 tablet (500 mg total) by mouth every 6 (six) hours as needed for muscle spasms. (Patient not taking: Reported on 07/01/2022) 20 tablet 0    methotrexate 2.5 MG tablet Take 7.5 mg by mouth every Wednesday.      mineral oil light external liquid Apply 1 Application topically daily as needed (In ears of needed).      Multiple Vitamin (MULTIVITAMIN WITH MINERALS) TABS tablet Take 1 tablet by mouth in the morning. Woman's      Omega-3 Fatty Acids (FISH OIL) 1000 MG CAPS Take 1,000 mg by mouth in the morning.      ondansetron (ZOFRAN) 4 MG tablet Take 1 tablet (4 mg total) by mouth every 8 (eight) hours as  needed for nausea or vomiting. 20 tablet 0    polyethylene glycol (MIRALAX) 17 g packet Take 17 g by mouth daily as needed for moderate constipation or severe constipation. (Patient not taking: Reported on 07/01/2022) 14 each 0    senna (SENOKOT) 8.6 MG TABS tablet Take 2 tablets (17.2 mg total) by mouth at bedtime. (Patient not taking: Reported on 07/01/2022) 60 tablet 1    silver sulfADIAZINE (SILVADENE) 1 % cream Apply 1 application topically daily as needed (foot wounds/sores).      triamcinolone cream (KENALOG) 0.1 % Apply 1 Application topically 3 (three) times daily as needed for irritation or itching.      triamcinolone ointment (KENALOG) 0.5 % Apply 1 application topically 2 (two) times daily as needed (skin itching/irritation).      vitamin B-12 (CYANOCOBALAMIN) 1000 MCG tablet Take 2,000 mcg by mouth in the morning.      No current facility-administered medications  for this visit.   Allergies  Allergen Reactions   Doxycycline Itching and Rash   Demerol [Meperidine] Nausea Only    nausea   Pantoprazole Other (See Comments)    Subacute Cutaneous Lupus of the Skin   Plaquenil [Hydroxychloroquine]     Caused another Rash on top of Lupus Rash   Sulfa Antibiotics Swelling    *Childhood*   Sulfamethoxazole-Trimethoprim Other (See Comments)    Childhood    Ranitidine Rash    Social History   Tobacco Use   Smoking status: Never    Passive exposure: Never   Smokeless tobacco: Never  Substance Use Topics   Alcohol use: No    No family history on file.   Review of Systems  Musculoskeletal:  Positive for arthralgias, gait problem, joint swelling and myalgias.  All other systems reviewed and are negative.   Objective:  Physical Exam Constitutional:      Appearance: Normal appearance.  HENT:     Head: Normocephalic and atraumatic.     Nose: Nose normal.     Mouth/Throat:     Mouth: Mucous membranes are moist.     Pharynx: Oropharynx is clear.  Eyes:     Extraocular  Movements: Extraocular movements intact.  Cardiovascular:     Rate and Rhythm: Normal rate and regular rhythm.     Pulses: Normal pulses.     Heart sounds: Normal heart sounds.  Pulmonary:     Effort: Pulmonary effort is normal.     Breath sounds: Normal breath sounds.  Abdominal:     General: Abdomen is flat.     Palpations: Abdomen is soft.  Genitourinary:    Comments: deferred Musculoskeletal:     Cervical back: Normal range of motion and neck supple.     Comments: Examination of the right knee reveals no skin wounds, lesions, rashes, or erythema. She has swelling, trace effusion. No warmth or erythema. Mild valgus deformity. Tenderness to palpation medial joint line, lateral joint line, peripatellar retinacular tissues with a positive grind side. Range of motion is 28 to 90 degrees without any ligamentous instability.   Distally, she has palpable pedal pulses. She has subjective sensory change due to Charcot-Marie-Tooth. No focal motor deficit.  No significant pedal edema. No calf tenderness to palpation.   Skin:    General: Skin is warm and dry.     Capillary Refill: Capillary refill takes less than 2 seconds.  Neurological:     General: No focal deficit present.     Mental Status: She is alert and oriented to person, place, and time.  Psychiatric:        Mood and Affect: Mood normal.        Behavior: Behavior normal.        Thought Content: Thought content normal.        Judgment: Judgment normal.     Vital signs in last 24 hours: '@VSRANGES'$ @  Labs:   Estimated body mass index is 25.54 kg/m as calculated from the following:   Height as of 07/04/22: '5\' 8"'$  (1.727 m).   Weight as of 07/04/22: 76.2 kg.   Imaging Review Plain radiographs demonstrate severe degenerative joint disease of the right knee(s). The overall alignment ismild valgus. The bone quality appears to be adequate for age and reported activity level.      Assessment/Plan:  End stage arthritis,  right knee   The patient history, physical examination, clinical judgment of the provider and imaging studies are consistent with end stage  degenerative joint disease of the right knee(s) and total knee arthroplasty is deemed medically necessary. The treatment options including medical management, injection therapy arthroscopy and arthroplasty were discussed at length. The risks and benefits of total knee arthroplasty were presented and reviewed. The risks due to aseptic loosening, infection, stiffness, patella tracking problems, thromboembolic complications and other imponderables were discussed. The patient acknowledged the explanation, agreed to proceed with the plan and consent was signed. Patient is being admitted for inpatient treatment for surgery, pain control, PT, OT, prophylactic antibiotics, VTE prophylaxis, progressive ambulation and ADL's and discharge planning. The patient is planning to be discharged home after an overnight stay with OPPT.   Therapy Plans: outpatient therapy. Merry Proud, [place] [address]  Disposition: Home with Joaquim Lai and Ed. We discussed that someone has be at home with her after surgery or we will have to cancel her procedure.  Planned DVT Prophylaxis: aspirin '81mg'$  BID DME needed: None has rolling walker. Iceman ice machine today.  PCP: Cleared Cardiology: Cleared TXA: Yes Allergies:  - Demerol - vomiting - Doxycycline - rash - Ranitidine - rash - Sulfa antibiotics - swelling/unknown childhood. Anesthesia Concerns: Nausea with anesthesia. BMI: 26.3 Last HgbA1c: Not diabetic Other: - Asthma - SLE - methotrexate. Stopped 04/29/22. Hold until further notice. ?? - Charcot Marie Tooth, AFOs bilaterally - Right lower back pain. - Cr. 0.85 - Hgb 14.2, K+ 3.9. Patient had records with her. - Gabapentin '900mg'$  TID at baseline.  - Oxycodone, methocarbamol, zofran, has meloxicam.  - S/p Rt THA, 05/30/22.     Patient's anticipated LOS is less than 2 midnights,  meeting these requirements: - Younger than 91 - Lives within 1 hour of care - Has a competent adult at home to recover with post-op recover - NO history of  - Chronic pain requiring opiods  - Diabetes  - Coronary Artery Disease  - Heart failure  - Heart attack  - Stroke  - DVT/VTE  - Cardiac arrhythmia  - Respiratory Failure/COPD  - Renal failure  - Anemia  - Advanced Liver disease

## 2022-07-15 NOTE — H&P (View-Only) (Signed)
TOTAL KNEE ADMISSION H&P  Patient is being admitted for right total knee arthroplasty.  Subjective:  Chief Complaint:right knee pain.  HPI: Marie Calhoun, 71 y.o. female, has a history of pain and functional disability in the right knee due to arthritis and has failed non-surgical conservative treatments for greater than 12 weeks to includeNSAID's and/or analgesics, corticosteriod injections, flexibility and strengthening excercises, supervised PT with diminished ADL's post treatment, use of assistive devices, and activity modification.  Onset of symptoms was gradual, starting 10 years ago with rapidlly worsening course since that time. The patient noted no past surgery on the right knee(s).  Patient currently rates pain in the right knee(s) at 10 out of 10 with activity. Patient has night pain, worsening of pain with activity and weight bearing, pain that interferes with activities of daily living, pain with passive range of motion, crepitus, and joint swelling.  Patient has evidence of subchondral cysts, subchondral sclerosis, periarticular osteophytes, and joint space narrowing by imaging studies. There is no active infection.  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   Asthma 05/28/2017   Environmental allergies 05/28/2017   Essential hypertension, benign 05/28/2017   GERD (gastroesophageal reflux disease) 05/28/2017   Past Medical History:  Diagnosis Date   Anxiety    Arthritis    Asthma 05/28/2017   Cancer ([place])    skin cancer   Charcot-Marie-Tooth disease    Depression    Environmental allergies 05/28/2017   Esophageal stricture 05/28/2017   Essential hypertension, benign 05/28/2017   GERD  (gastroesophageal reflux disease) 05/28/2017   Hypercholesteremia    Lupus ([facility])    of skin   PONV (postoperative nausea and vomiting)    Seasonal allergies     Past Surgical History:  Procedure Laterality Date   breast tumor\     left benign   CHOLECYSTECTOMY     COLONOSCOPY WITH PROPOFOL N/A 05/30/2021   Procedure: COLONOSCOPY WITH PROPOFOL;  Surgeon: Rogene Houston, MD;  [facility]: [facility];  Service: Endoscopy;  Laterality: N/A;  [place] N/A 09/11/2017   Procedure: ESOPHAGEAL DILATION;  Surgeon: Rogene Houston, MD;  Location: [facility];  Service: Endoscopy;  Laterality: N/A;   ESOPHAGEAL DILATION N/A 06/17/2018   Procedure: ESOPHAGEAL DILATION;  Surgeon: Rogene Houston, MD;  [facility]: [facility];  Service: Endoscopy;  Laterality: N/A;   ESOPHAGOGASTRODUODENOSCOPY N/A 09/11/2017   Procedure: ESOPHAGOGASTRODUODENOSCOPY (EGD);  Surgeon: Rogene Houston, MD;  [facility]: [facility];  Service: Endoscopy;  Laterality: N/A;  1:40 - Can NOT come earlier   ESOPHAGOGASTRODUODENOSCOPY N/A 06/17/2018   Procedure: ESOPHAGOGASTRODUODENOSCOPY (EGD);  Surgeon: Rogene Houston, MD;  [facility]: [facility];  Service: Endoscopy;  Laterality: N/A;  10:30   hand tendon release Left    knee surgeries     [facility] Ann   POLYPECTOMY  05/30/2021   Procedure: POLYPECTOMY;  Surgeon: Rogene Houston, MD;  [facility]: [facility];  Service: Endoscopy;;   TOTAL HIP ARTHROPLASTY Right 05/30/2022   Procedure: TOTAL HIP ARTHROPLASTY ANTERIOR APPROACH;  Surgeon: Rod Can, MD;  Location: [facility];  Service: Orthopedics;  Laterality: Right;  Current Outpatient Medications  Medication Sig Dispense Refill Last Dose   acetaminophen (TYLENOL) 500 MG tablet Take 1,000 mg by mouth in the morning.      acyclovir (ZOVIRAX) 400 MG tablet Take 400 mg by mouth 3 (three) times daily as needed (for cold sores/fever blisters  (takes for 7 days when needed)).      albuterol (VENTOLIN HFA) 108 (90 Base) MCG/ACT inhaler Inhale 2 puffs into the lungs every 6 (six) hours as needed for wheezing or shortness of breath. 18 g 3    Calcium Carb-Cholecalciferol (CALCIUM 600 + D PO) Take 2 tablets by mouth in the morning.      cycloSPORINE (RESTASIS) 0.05 % ophthalmic emulsion Place 1 drop into both eyes 2 (two) times daily.      fluticasone (FLONASE) 50 MCG/ACT nasal spray Place 1-2 sprays into both nostrils daily as needed for allergies. 16 g 3    folic acid (FOLVITE) 1 MG tablet Take 1 mg by mouth daily.      gabapentin (NEURONTIN) 300 MG capsule Take 300 mg by mouth in the morning, at noon, and at bedtime.      ketoconazole (NIZORAL) 2 % cream Apply 1 Application topically daily. Apply to nail and finger around affected area (Patient not taking: Reported on 07/01/2022)      lansoprazole (PREVACID) 30 MG capsule TAKE 1 CAPSULE BY MOUTH ONCE DAILY ON AN EMPTY STOMACH, 30-45 MINUTES BEFORE BREAKFAST 90 capsule 0    linaclotide (LINZESS) 72 MCG capsule TAKE 1 CAPSULE EVERY DAY BEFORE BREAKFAST 90 capsule 3    losartan-hydrochlorothiazide (HYZAAR) 50-12.5 MG tablet Take 0.5 tablets by mouth at bedtime.      meloxicam (MOBIC) 15 MG tablet Take 1 tablet (15 mg total) by mouth daily. 30 tablet 2    methocarbamol (ROBAXIN) 500 MG tablet Take 1 tablet (500 mg total) by mouth every 6 (six) hours as needed for muscle spasms. (Patient not taking: Reported on 07/01/2022) 20 tablet 0    methotrexate 2.5 MG tablet Take 7.5 mg by mouth every Wednesday.      mineral oil light external liquid Apply 1 Application topically daily as needed (In ears of needed).      Multiple Vitamin (MULTIVITAMIN WITH MINERALS) TABS tablet Take 1 tablet by mouth in the morning. Woman's      Omega-3 Fatty Acids (FISH OIL) 1000 MG CAPS Take 1,000 mg by mouth in the morning.      ondansetron (ZOFRAN) 4 MG tablet Take 1 tablet (4 mg total) by mouth every 8 (eight) hours as  needed for nausea or vomiting. 20 tablet 0    polyethylene glycol (MIRALAX) 17 g packet Take 17 g by mouth daily as needed for moderate constipation or severe constipation. (Patient not taking: Reported on 07/01/2022) 14 each 0    senna (SENOKOT) 8.6 MG TABS tablet Take 2 tablets (17.2 mg total) by mouth at bedtime. (Patient not taking: Reported on 07/01/2022) 60 tablet 1    silver sulfADIAZINE (SILVADENE) 1 % cream Apply 1 application topically daily as needed (foot wounds/sores).      triamcinolone cream (KENALOG) 0.1 % Apply 1 Application topically 3 (three) times daily as needed for irritation or itching.      triamcinolone ointment (KENALOG) 0.5 % Apply 1 application topically 2 (two) times daily as needed (skin itching/irritation).      vitamin B-12 (CYANOCOBALAMIN) 1000 MCG tablet Take 2,000 mcg by mouth in the morning.      No current facility-administered medications  for this visit.   Allergies  Allergen Reactions   Doxycycline Itching and Rash   Demerol [Meperidine] Nausea Only    nausea   Pantoprazole Other (See Comments)    Subacute Cutaneous Lupus of the Skin   Plaquenil [Hydroxychloroquine]     Caused another Rash on top of Lupus Rash   Sulfa Antibiotics Swelling    *Childhood*   Sulfamethoxazole-Trimethoprim Other (See Comments)    Childhood    Ranitidine Rash    Social History   Tobacco Use   Smoking status: Never    Passive exposure: Never   Smokeless tobacco: Never  Substance Use Topics   Alcohol use: No    No family history on file.   Review of Systems  Musculoskeletal:  Positive for arthralgias, gait problem, joint swelling and myalgias.  All other systems reviewed and are negative.   Objective:  Physical Exam Constitutional:      Appearance: Normal appearance.  HENT:     Head: Normocephalic and atraumatic.     Nose: Nose normal.     Mouth/Throat:     Mouth: Mucous membranes are moist.     Pharynx: Oropharynx is clear.  Eyes:     Extraocular  Movements: Extraocular movements intact.  Cardiovascular:     Rate and Rhythm: Normal rate and regular rhythm.     Pulses: Normal pulses.     Heart sounds: Normal heart sounds.  Pulmonary:     Effort: Pulmonary effort is normal.     Breath sounds: Normal breath sounds.  Abdominal:     General: Abdomen is flat.     Palpations: Abdomen is soft.  Genitourinary:    Comments: deferred Musculoskeletal:     Cervical back: Normal range of motion and neck supple.     Comments: Examination of the right knee reveals no skin wounds, lesions, rashes, or erythema. She has swelling, trace effusion. No warmth or erythema. Mild valgus deformity. Tenderness to palpation medial joint line, lateral joint line, peripatellar retinacular tissues with a positive grind side. Range of motion is 28 to 90 degrees without any ligamentous instability.   Distally, she has palpable pedal pulses. She has subjective sensory change due to Charcot-Marie-Tooth. No focal motor deficit.  No significant pedal edema. No calf tenderness to palpation.   Skin:    General: Skin is warm and dry.     Capillary Refill: Capillary refill takes less than 2 seconds.  Neurological:     General: No focal deficit present.     Mental Status: She is alert and oriented to person, place, and time.  Psychiatric:        Mood and Affect: Mood normal.        Behavior: Behavior normal.        Thought Content: Thought content normal.        Judgment: Judgment normal.     Vital signs in last 24 hours: '@VSRANGES'$ @  Labs:   Estimated body mass index is 25.54 kg/m as calculated from the following:   Height as of 07/04/22: '5\' 8"'$  (1.727 m).   Weight as of 07/04/22: 76.2 kg.   Imaging Review Plain radiographs demonstrate severe degenerative joint disease of the right knee(s). The overall alignment ismild valgus. The bone quality appears to be adequate for age and reported activity level.      Assessment/Plan:  End stage arthritis,  right knee   The patient history, physical examination, clinical judgment of the provider and imaging studies are consistent with end stage  degenerative joint disease of the right knee(s) and total knee arthroplasty is deemed medically necessary. The treatment options including medical management, injection therapy arthroscopy and arthroplasty were discussed at length. The risks and benefits of total knee arthroplasty were presented and reviewed. The risks due to aseptic loosening, infection, stiffness, patella tracking problems, thromboembolic complications and other imponderables were discussed. The patient acknowledged the explanation, agreed to proceed with the plan and consent was signed. Patient is being admitted for inpatient treatment for surgery, pain control, PT, OT, prophylactic antibiotics, VTE prophylaxis, progressive ambulation and ADL's and discharge planning. The patient is planning to be discharged home after an overnight stay with OPPT.   Therapy Plans: outpatient therapy. Merry Proud, [place] [address]  Disposition: Home with Joaquim Lai and Ed. We discussed that someone has be at home with her after surgery or we will have to cancel her procedure.  Planned DVT Prophylaxis: aspirin '81mg'$  BID DME needed: None has rolling walker. Iceman ice machine today.  PCP: Cleared Cardiology: Cleared TXA: Yes Allergies:  - Demerol - vomiting - Doxycycline - rash - Ranitidine - rash - Sulfa antibiotics - swelling/unknown childhood. Anesthesia Concerns: Nausea with anesthesia. BMI: 26.3 Last HgbA1c: Not diabetic Other: - Asthma - SLE - methotrexate. Stopped 04/29/22. Hold until further notice. ?? - Charcot Marie Tooth, AFOs bilaterally - Right lower back pain. - Cr. 0.85 - Hgb 14.2, K+ 3.9. Patient had records with her. - Gabapentin '900mg'$  TID at baseline.  - Oxycodone, methocarbamol, zofran, has meloxicam.  - S/p Rt THA, 05/30/22.     Patient's anticipated LOS is less than 2 midnights,  meeting these requirements: - Younger than 39 - Lives within 1 hour of care - Has a competent adult at home to recover with post-op recover - NO history of  - Chronic pain requiring opiods  - Diabetes  - Coronary Artery Disease  - Heart failure  - Heart attack  - Stroke  - DVT/VTE  - Cardiac arrhythmia  - Respiratory Failure/COPD  - Renal failure  - Anemia  - Advanced Liver disease

## 2022-07-16 NOTE — Anesthesia Preprocedure Evaluation (Signed)
Anesthesia Evaluation  Patient identified by MRN, date of birth, ID band Patient awake    Reviewed: Allergy & Precautions, NPO status , Patient's Chart, lab work & pertinent test results  Airway Mallampati: II  TM Distance: >3 FB Neck ROM: Full    Dental no notable dental hx. (+) Partial Upper, Partial Lower, Poor Dentition   Pulmonary asthma ,    Pulmonary exam normal breath sounds clear to auscultation       Cardiovascular hypertension, Normal cardiovascular exam Rhythm:Regular Rate:Normal     Neuro/Psych Anxiety Depression Charcot marie tooth    GI/Hepatic Neg liver ROS, GERD  Medicated,  Endo/Other    Renal/GU Lab Results      Component                Value               Date                      CREATININE               0.95                07/04/2022                 K                        3.8                 07/04/2022                   Musculoskeletal  (+) Arthritis , Osteoarthritis,    Abdominal   Peds  Hematology Lab Results      Component                Value               Date                      HGB                      12.4                07/04/2022                HCT                      37.4                07/04/2022                  PLT                      273                 07/04/2022              Anesthesia Other Findings All: demerol, doxycycline sulfa, plaquenil, pantoprazole, ranitidine   Lupus  Reproductive/Obstetrics                            Anesthesia Physical Anesthesia Plan  ASA: 3  Anesthesia Plan: Spinal and Regional   Post-op Pain Management: Minimal or no pain anticipated and Regional block*   Induction:   PONV Risk Score and Plan: 2 and  Treatment may vary due to age or medical condition and Propofol infusion  Airway Management Planned: Natural Airway and Nasal Cannula  Additional Equipment: None  Intra-op Plan:   Post-operative Plan:    Informed Consent: I have reviewed the patients History and Physical, chart, labs and discussed the procedure including the risks, benefits and alternatives for the proposed anesthesia with the patient or authorized representative who has indicated his/her understanding and acceptance.     Dental advisory given  Plan Discussed with: CRNA  Anesthesia Plan Comments: (Spinal w R Adductor canal)       Anesthesia Quick Evaluation

## 2022-07-17 ENCOUNTER — Encounter (HOSPITAL_COMMUNITY)
Admission: AD | Disposition: A | Discharge status: Home / Self Care | Payer: Self-pay | Source: Ambulatory Visit | Attending: Orthopedic Surgery

## 2022-07-17 ENCOUNTER — Other Ambulatory Visit: Payer: Self-pay

## 2022-07-17 ENCOUNTER — Ambulatory Visit (HOSPITAL_BASED_OUTPATIENT_CLINIC_OR_DEPARTMENT_OTHER): Payer: Medicare Other | Admitting: Anesthesiology

## 2022-07-17 ENCOUNTER — Inpatient Hospital Stay (HOSPITAL_COMMUNITY)
Admission: AD | Admit: 2022-07-17 | Discharge: 2022-07-24 | DRG: 470 | Disposition: A | Discharge status: Home / Self Care | Payer: Medicare Other | Source: Ambulatory Visit | Attending: Orthopedic Surgery | Admitting: Orthopedic Surgery

## 2022-07-17 ENCOUNTER — Encounter (HOSPITAL_COMMUNITY): Payer: Self-pay | Admitting: Orthopedic Surgery

## 2022-07-17 ENCOUNTER — Ambulatory Visit (HOSPITAL_COMMUNITY): Payer: Medicare Other | Admitting: Physician Assistant

## 2022-07-17 ENCOUNTER — Observation Stay (HOSPITAL_COMMUNITY): Payer: Medicare Other

## 2022-07-17 DIAGNOSIS — M1711 Unilateral primary osteoarthritis, right knee: Secondary | ICD-10-CM

## 2022-07-17 DIAGNOSIS — Z882 Allergy status to sulfonamides status: Secondary | ICD-10-CM

## 2022-07-17 DIAGNOSIS — K7689 Other specified diseases of liver: Secondary | ICD-10-CM | POA: Diagnosis present

## 2022-07-17 DIAGNOSIS — R0902 Hypoxemia: Secondary | ICD-10-CM

## 2022-07-17 DIAGNOSIS — G600 Hereditary motor and sensory neuropathy: Secondary | ICD-10-CM | POA: Diagnosis present

## 2022-07-17 DIAGNOSIS — F419 Anxiety disorder, unspecified: Secondary | ICD-10-CM | POA: Diagnosis present

## 2022-07-17 DIAGNOSIS — N179 Acute kidney failure, unspecified: Secondary | ICD-10-CM | POA: Diagnosis present

## 2022-07-17 DIAGNOSIS — E876 Hypokalemia: Secondary | ICD-10-CM

## 2022-07-17 DIAGNOSIS — K219 Gastro-esophageal reflux disease without esophagitis: Secondary | ICD-10-CM | POA: Diagnosis present

## 2022-07-17 DIAGNOSIS — J9811 Atelectasis: Secondary | ICD-10-CM | POA: Diagnosis not present

## 2022-07-17 DIAGNOSIS — K769 Liver disease, unspecified: Secondary | ICD-10-CM

## 2022-07-17 DIAGNOSIS — Z96641 Presence of right artificial hip joint: Secondary | ICD-10-CM | POA: Diagnosis present

## 2022-07-17 DIAGNOSIS — I10 Essential (primary) hypertension: Secondary | ICD-10-CM

## 2022-07-17 DIAGNOSIS — Z85828 Personal history of other malignant neoplasm of skin: Secondary | ICD-10-CM

## 2022-07-17 DIAGNOSIS — L932 Other local lupus erythematosus: Secondary | ICD-10-CM

## 2022-07-17 DIAGNOSIS — Z888 Allergy status to other drugs, medicaments and biological substances status: Secondary | ICD-10-CM

## 2022-07-17 DIAGNOSIS — I472 Ventricular tachycardia, unspecified: Secondary | ICD-10-CM | POA: Diagnosis not present

## 2022-07-17 DIAGNOSIS — E7800 Pure hypercholesterolemia, unspecified: Secondary | ICD-10-CM | POA: Diagnosis present

## 2022-07-17 DIAGNOSIS — L930 Discoid lupus erythematosus: Secondary | ICD-10-CM | POA: Diagnosis present

## 2022-07-17 DIAGNOSIS — Z96651 Presence of right artificial knee joint: Secondary | ICD-10-CM

## 2022-07-17 DIAGNOSIS — D649 Anemia, unspecified: Secondary | ICD-10-CM | POA: Diagnosis not present

## 2022-07-17 DIAGNOSIS — F418 Other specified anxiety disorders: Secondary | ICD-10-CM

## 2022-07-17 DIAGNOSIS — F32A Depression, unspecified: Secondary | ICD-10-CM | POA: Diagnosis present

## 2022-07-17 DIAGNOSIS — J45909 Unspecified asthma, uncomplicated: Secondary | ICD-10-CM | POA: Diagnosis not present

## 2022-07-17 DIAGNOSIS — Z791 Long term (current) use of non-steroidal anti-inflammatories (NSAID): Secondary | ICD-10-CM

## 2022-07-17 DIAGNOSIS — Z01818 Encounter for other preprocedural examination: Principal | ICD-10-CM

## 2022-07-17 DIAGNOSIS — Z79899 Other long term (current) drug therapy: Secondary | ICD-10-CM

## 2022-07-17 DIAGNOSIS — K567 Ileus, unspecified: Secondary | ICD-10-CM | POA: Diagnosis not present

## 2022-07-17 DIAGNOSIS — E162 Hypoglycemia, unspecified: Secondary | ICD-10-CM

## 2022-07-17 DIAGNOSIS — E869 Volume depletion, unspecified: Secondary | ICD-10-CM | POA: Diagnosis present

## 2022-07-17 HISTORY — PX: KNEE ARTHROPLASTY: SHX992

## 2022-07-17 SURGERY — ARTHROPLASTY, KNEE, TOTAL, USING IMAGELESS COMPUTER-ASSISTED NAVIGATION
Anesthesia: Regional | Site: Knee | Laterality: Right

## 2022-07-17 MED ORDER — CELECOXIB 200 MG PO CAPS: 200.0000 mg | ORAL_CAPSULE | Freq: Two times a day (BID) | ORAL | Status: DC

## 2022-07-17 MED ORDER — ISOPROPYL ALCOHOL 70 % SOLN
Status: AC
  Filled 2022-07-17: qty 480

## 2022-07-17 MED ORDER — LACTATED RINGERS IV SOLN: INTRAVENOUS | Status: DC

## 2022-07-17 MED ORDER — LINACLOTIDE 72 MCG PO CAPS
72.0000 ug | ORAL_CAPSULE | Freq: Every day | ORAL | Status: DC
  Administered 2022-07-18 – 2022-07-19 (×2): 72 ug via ORAL
  Filled 2022-07-17 (×3): qty 1

## 2022-07-17 MED ORDER — MELOXICAM 15 MG PO TABS
15.0000 mg | ORAL_TABLET | Freq: Every day | ORAL | Status: DC
  Administered 2022-07-17: 15 mg via ORAL
  Filled 2022-07-17: qty 1

## 2022-07-17 MED ORDER — SODIUM CHLORIDE 0.9 % IV SOLN: INTRAVENOUS | Status: DC

## 2022-07-17 MED ORDER — ONDANSETRON HCL 4 MG/2ML IJ SOLN
4.0000 mg | Freq: Four times a day (QID) | INTRAMUSCULAR | Status: DC | PRN
  Administered 2022-07-17 – 2022-07-21 (×5): 4 mg via INTRAVENOUS
  Filled 2022-07-17 (×5): qty 2

## 2022-07-17 MED ORDER — METOCLOPRAMIDE HCL 5 MG/ML IJ SOLN
5.0000 mg | Freq: Three times a day (TID) | INTRAMUSCULAR | Status: DC | PRN
  Administered 2022-07-20 – 2022-07-21 (×3): 10 mg via INTRAVENOUS
  Filled 2022-07-17 (×3): qty 2

## 2022-07-17 MED ORDER — ONDANSETRON HCL 4 MG PO TABS
4.0000 mg | ORAL_TABLET | Freq: Four times a day (QID) | ORAL | Status: DC | PRN
  Administered 2022-07-19: 4 mg via ORAL
  Filled 2022-07-17: qty 1

## 2022-07-17 MED ORDER — HYDROMORPHONE HCL 1 MG/ML IJ SOLN
0.5000 mg | INTRAMUSCULAR | Status: DC | PRN
  Administered 2022-07-17: 0.5 mg via INTRAVENOUS
  Administered 2022-07-20: 1 mg via INTRAVENOUS
  Filled 2022-07-17: qty 1

## 2022-07-17 MED ORDER — BUPIVACAINE IN DEXTROSE 0.75-8.25 % IT SOLN
INTRATHECAL | Status: DC | PRN
  Administered 2022-07-17: 12 mg via INTRATHECAL

## 2022-07-17 MED ORDER — ACETAMINOPHEN 500 MG PO TABS: 1000.0000 mg | ORAL_TABLET | Freq: Three times a day (TID) | ORAL | 0 refills | Status: AC | PRN

## 2022-07-17 MED ORDER — ONDANSETRON HCL 4 MG/2ML IJ SOLN
INTRAMUSCULAR | Status: DC | PRN
  Administered 2022-07-17: 4 mg via INTRAVENOUS

## 2022-07-17 MED ORDER — CLONIDINE HCL (ANALGESIA) 100 MCG/ML EP SOLN
EPIDURAL | Status: DC | PRN
  Administered 2022-07-17: 100 ug

## 2022-07-17 MED ORDER — CHLORHEXIDINE GLUCONATE 0.12 % MT SOLN
15.0000 mL | Freq: Once | OROMUCOSAL | Status: AC
  Administered 2022-07-17: 15 mL via OROMUCOSAL

## 2022-07-17 MED ORDER — KETOROLAC TROMETHAMINE 30 MG/ML IJ SOLN
INTRAMUSCULAR | Status: AC
  Filled 2022-07-17: qty 1

## 2022-07-17 MED ORDER — ACYCLOVIR 400 MG PO TABS: 400.0000 mg | ORAL_TABLET | Freq: Three times a day (TID) | ORAL | Status: DC | PRN

## 2022-07-17 MED ORDER — MENTHOL 3 MG MT LOZG: 1.0000 | LOZENGE | OROMUCOSAL | Status: DC | PRN

## 2022-07-17 MED ORDER — POVIDONE-IODINE 10 % EX SWAB: 2.0000 | Freq: Once | CUTANEOUS | Status: DC

## 2022-07-17 MED ORDER — ASPIRIN 81 MG PO CHEW
81.0000 mg | CHEWABLE_TABLET | Freq: Two times a day (BID) | ORAL | Status: DC
  Administered 2022-07-17 – 2022-07-24 (×14): 81 mg via ORAL
  Filled 2022-07-17 (×14): qty 1

## 2022-07-17 MED ORDER — DEXAMETHASONE SODIUM PHOSPHATE 10 MG/ML IJ SOLN
INTRAMUSCULAR | Status: DC | PRN
  Administered 2022-07-17: 10 mg via INTRAVENOUS

## 2022-07-17 MED ORDER — DIPHENHYDRAMINE HCL 12.5 MG/5ML PO ELIX
12.5000 mg | ORAL_SOLUTION | ORAL | Status: DC | PRN
  Administered 2022-07-18 – 2022-07-22 (×5): 25 mg via ORAL
  Filled 2022-07-17 (×5): qty 10

## 2022-07-17 MED ORDER — OXYCODONE HCL 5 MG PO TABS
ORAL_TABLET | ORAL | Status: AC
  Filled 2022-07-17: qty 1

## 2022-07-17 MED ORDER — BISACODYL 10 MG RE SUPP: 10.0000 mg | Freq: Every day | RECTAL | Status: DC | PRN

## 2022-07-17 MED ORDER — SODIUM CHLORIDE 0.9 % IR SOLN
Status: DC | PRN
  Administered 2022-07-17 (×2): 1000 mL

## 2022-07-17 MED ORDER — FENTANYL CITRATE (PF) 100 MCG/2ML IJ SOLN
INTRAMUSCULAR | Status: DC | PRN
  Administered 2022-07-17 (×4): 50 ug via INTRAVENOUS

## 2022-07-17 MED ORDER — PHENYLEPHRINE HCL-NACL 20-0.9 MG/250ML-% IV SOLN
INTRAVENOUS | Status: AC
  Filled 2022-07-17: qty 250

## 2022-07-17 MED ORDER — ONDANSETRON HCL 4 MG/2ML IJ SOLN
INTRAMUSCULAR | Status: AC
  Filled 2022-07-17: qty 2

## 2022-07-17 MED ORDER — ISOPROPYL ALCOHOL 70 % SOLN
Status: DC | PRN
  Administered 2022-07-17: 1 via TOPICAL

## 2022-07-17 MED ORDER — OXYCODONE HCL 5 MG PO TABS
5.0000 mg | ORAL_TABLET | Freq: Once | ORAL | Status: AC | PRN
  Administered 2022-07-17: 5 mg via ORAL

## 2022-07-17 MED ORDER — CYCLOSPORINE 0.05 % OP EMUL
1.0000 [drp] | Freq: Two times a day (BID) | OPHTHALMIC | Status: DC
  Administered 2022-07-17 – 2022-07-24 (×12): 1 [drp] via OPHTHALMIC
  Filled 2022-07-17 (×15): qty 30

## 2022-07-17 MED ORDER — SODIUM CHLORIDE (PF) 0.9 % IJ SOLN
INTRAMUSCULAR | Status: DC | PRN
  Administered 2022-07-17: 30 mL

## 2022-07-17 MED ORDER — POLYETHYLENE GLYCOL 3350 17 G PO PACK
17.0000 g | PACK | Freq: Every day | ORAL | Status: DC | PRN
Start: 2022-07-17 — End: 2022-07-24

## 2022-07-17 MED ORDER — PROPOFOL 1000 MG/100ML IV EMUL
INTRAVENOUS | Status: AC
  Filled 2022-07-17: qty 100

## 2022-07-17 MED ORDER — ACETAMINOPHEN 10 MG/ML IV SOLN: 1000.0000 mg | Freq: Once | INTRAVENOUS | Status: DC | PRN

## 2022-07-17 MED ORDER — BUPIVACAINE-EPINEPHRINE 0.25% -1:200000 IJ SOLN
INTRAMUSCULAR | Status: DC | PRN
  Administered 2022-07-17: 30 mL

## 2022-07-17 MED ORDER — MIDAZOLAM HCL 2 MG/2ML IJ SOLN
INTRAMUSCULAR | Status: AC
  Filled 2022-07-17: qty 2

## 2022-07-17 MED ORDER — KETOROLAC TROMETHAMINE 30 MG/ML IJ SOLN
INTRAMUSCULAR | Status: DC | PRN
  Administered 2022-07-17: 30 mg

## 2022-07-17 MED ORDER — METOCLOPRAMIDE HCL 5 MG PO TABS: 5.0000 mg | ORAL_TABLET | Freq: Three times a day (TID) | ORAL | Status: DC | PRN

## 2022-07-17 MED ORDER — TRANEXAMIC ACID-NACL 1000-0.7 MG/100ML-% IV SOLN
1000.0000 mg | INTRAVENOUS | Status: AC
  Administered 2022-07-17: 1000 mg via INTRAVENOUS
  Filled 2022-07-17: qty 100

## 2022-07-17 MED ORDER — ONDANSETRON HCL 4 MG/2ML IJ SOLN: 4.0000 mg | Freq: Once | INTRAMUSCULAR | Status: DC | PRN

## 2022-07-17 MED ORDER — ALBUTEROL SULFATE (2.5 MG/3ML) 0.083% IN NEBU
2.5000 mg | INHALATION_SOLUTION | Freq: Four times a day (QID) | RESPIRATORY_TRACT | Status: DC | PRN
Start: 2022-07-17 — End: 2022-07-24

## 2022-07-17 MED ORDER — ALUM & MAG HYDROXIDE-SIMETH 200-200-20 MG/5ML PO SUSP: 30.0000 mL | ORAL | Status: DC | PRN

## 2022-07-17 MED ORDER — OXYCODONE HCL 5 MG PO TABS
5.0000 mg | ORAL_TABLET | ORAL | Status: DC | PRN
  Administered 2022-07-18 – 2022-07-19 (×5): 10 mg via ORAL
  Administered 2022-07-23: 5 mg via ORAL
  Filled 2022-07-17 (×6): qty 2
  Filled 2022-07-17: qty 1

## 2022-07-17 MED ORDER — OXYCODONE HCL 5 MG/5ML PO SOLN: 5.0000 mg | Freq: Once | ORAL | Status: AC | PRN

## 2022-07-17 MED ORDER — PROPOFOL 500 MG/50ML IV EMUL
INTRAVENOUS | Status: DC | PRN
  Administered 2022-07-17: 30 mg via INTRAVENOUS
  Administered 2022-07-17: 20 mg via INTRAVENOUS
  Administered 2022-07-17: 30 mg via INTRAVENOUS
  Administered 2022-07-17: 50 ug/kg/min via INTRAVENOUS

## 2022-07-17 MED ORDER — ACETAMINOPHEN 500 MG PO TABS
1000.0000 mg | ORAL_TABLET | Freq: Once | ORAL | Status: AC
  Administered 2022-07-17: 1000 mg via ORAL
  Filled 2022-07-17: qty 2

## 2022-07-17 MED ORDER — SENNA 8.6 MG PO TABS
1.0000 | ORAL_TABLET | Freq: Two times a day (BID) | ORAL | Status: DC
Start: 2022-07-17 — End: 2022-07-24
  Administered 2022-07-17 – 2022-07-24 (×13): 8.6 mg via ORAL
  Filled 2022-07-17 (×14): qty 1

## 2022-07-17 MED ORDER — SILVER SULFADIAZINE 1 % EX CREA: 1.0000 | TOPICAL_CREAM | Freq: Every day | CUTANEOUS | Status: DC | PRN

## 2022-07-17 MED ORDER — MIDAZOLAM HCL 5 MG/5ML IJ SOLN
INTRAMUSCULAR | Status: DC | PRN
  Administered 2022-07-17: 2 mg via INTRAVENOUS

## 2022-07-17 MED ORDER — METHOCARBAMOL 500 MG PO TABS
500.0000 mg | ORAL_TABLET | Freq: Four times a day (QID) | ORAL | Status: DC | PRN
  Administered 2022-07-22 – 2022-07-23 (×2): 500 mg via ORAL
  Filled 2022-07-17 (×2): qty 1

## 2022-07-17 MED ORDER — HYDROMORPHONE HCL 1 MG/ML IJ SOLN: 0.2500 mg | INTRAMUSCULAR | Status: DC | PRN

## 2022-07-17 MED ORDER — ACETAMINOPHEN 325 MG PO TABS
325.0000 mg | ORAL_TABLET | Freq: Four times a day (QID) | ORAL | Status: DC | PRN
  Administered 2022-07-18 – 2022-07-24 (×10): 650 mg via ORAL
  Filled 2022-07-17 (×12): qty 2

## 2022-07-17 MED ORDER — ROPIVACAINE HCL 5 MG/ML IJ SOLN
INTRAMUSCULAR | Status: DC | PRN
  Administered 2022-07-17: 30 mL via PERINEURAL

## 2022-07-17 MED ORDER — SODIUM CHLORIDE (PF) 0.9 % IJ SOLN
INTRAMUSCULAR | Status: AC
  Filled 2022-07-17: qty 50

## 2022-07-17 MED ORDER — PHENOL 1.4 % MT LIQD: 1.0000 | OROMUCOSAL | Status: DC | PRN

## 2022-07-17 MED ORDER — DOCUSATE SODIUM 100 MG PO CAPS
100.0000 mg | ORAL_CAPSULE | Freq: Two times a day (BID) | ORAL | Status: DC
  Administered 2022-07-17 – 2022-07-24 (×14): 100 mg via ORAL
  Filled 2022-07-17 (×15): qty 1

## 2022-07-17 MED ORDER — ORAL CARE MOUTH RINSE
15.0000 mL | Freq: Once | OROMUCOSAL | Status: AC
Start: 2022-07-17 — End: 2022-07-17

## 2022-07-17 MED ORDER — CEFAZOLIN SODIUM-DEXTROSE 2-4 GM/100ML-% IV SOLN
2.0000 g | Freq: Four times a day (QID) | INTRAVENOUS | Status: AC
  Administered 2022-07-17 (×2): 2 g via INTRAVENOUS
  Filled 2022-07-17 (×2): qty 100

## 2022-07-17 MED ORDER — POVIDONE-IODINE 10 % EX SWAB
2.0000 | Freq: Once | CUTANEOUS | Status: AC
  Administered 2022-07-17: 2 via TOPICAL

## 2022-07-17 MED ORDER — OXYCODONE HCL 5 MG PO TABS
10.0000 mg | ORAL_TABLET | ORAL | Status: DC | PRN
  Administered 2022-07-19: 10 mg via ORAL
  Filled 2022-07-17: qty 2

## 2022-07-17 MED ORDER — DEXAMETHASONE SODIUM PHOSPHATE 10 MG/ML IJ SOLN
INTRAMUSCULAR | Status: AC
  Filled 2022-07-17: qty 1

## 2022-07-17 MED ORDER — TRIAMCINOLONE ACETONIDE 0.5 % EX OINT: 1.0000 | TOPICAL_OINTMENT | Freq: Two times a day (BID) | CUTANEOUS | Status: DC | PRN

## 2022-07-17 MED ORDER — GABAPENTIN 300 MG PO CAPS
300.0000 mg | ORAL_CAPSULE | Freq: Three times a day (TID) | ORAL | Status: DC
  Administered 2022-07-17 – 2022-07-24 (×20): 300 mg via ORAL
  Filled 2022-07-17 (×20): qty 1

## 2022-07-17 MED ORDER — METHOCARBAMOL 500 MG IVPB - SIMPLE MED: 500.0000 mg | Freq: Four times a day (QID) | INTRAVENOUS | Status: DC | PRN

## 2022-07-17 MED ORDER — FLUTICASONE PROPIONATE 50 MCG/ACT NA SUSP: 1.0000 | Freq: Every day | NASAL | Status: DC | PRN

## 2022-07-17 MED ORDER — TRIAMCINOLONE ACETONIDE 0.1 % EX CREA: 1.0000 | TOPICAL_CREAM | Freq: Three times a day (TID) | CUTANEOUS | Status: DC | PRN

## 2022-07-17 MED ORDER — FENTANYL CITRATE (PF) 100 MCG/2ML IJ SOLN
INTRAMUSCULAR | Status: AC
  Filled 2022-07-17: qty 2

## 2022-07-17 MED ORDER — HYDROMORPHONE HCL 1 MG/ML IJ SOLN
INTRAMUSCULAR | Status: AC
  Administered 2022-07-17: 0.5 mg via INTRAVENOUS
  Filled 2022-07-17: qty 2

## 2022-07-17 MED ORDER — BUPIVACAINE-EPINEPHRINE (PF) 0.25% -1:200000 IJ SOLN
INTRAMUSCULAR | Status: AC
  Filled 2022-07-17: qty 30

## 2022-07-17 MED ORDER — CEFAZOLIN SODIUM-DEXTROSE 2-4 GM/100ML-% IV SOLN
2.0000 g | INTRAVENOUS | Status: AC
  Administered 2022-07-17: 2 g via INTRAVENOUS
  Filled 2022-07-17: qty 100

## 2022-07-17 SURGICAL SUPPLY — 70 items
BAG COUNTER SPONGE SURGICOUNT (BAG) IMPLANT
BAG ZIPLOCK 12X15 (MISCELLANEOUS) IMPLANT
BATTERY INSTRU NAVIGATION (MISCELLANEOUS) ×3 IMPLANT
BLADE SAW RECIPROCATING 77.5 (BLADE) ×1 IMPLANT
BNDG ELASTIC 4X5.8 VLCR STR LF (GAUZE/BANDAGES/DRESSINGS) ×1 IMPLANT
BNDG ELASTIC 6X5.8 VLCR STR LF (GAUZE/BANDAGES/DRESSINGS) ×1 IMPLANT
CHLORAPREP W/TINT 26 (MISCELLANEOUS) ×2 IMPLANT
COMP PATELLAR 10X35 METAL (Joint) ×1 IMPLANT
COMPONENT PATELLAR 10X35 METAL (Joint) IMPLANT
COVER SURGICAL LIGHT HANDLE (MISCELLANEOUS) ×1 IMPLANT
DERMABOND ADVANCED (GAUZE/BANDAGES/DRESSINGS) ×1
DERMABOND ADVANCED .7 DNX12 (GAUZE/BANDAGES/DRESSINGS) ×2 IMPLANT
DRAPE INCISE IOBAN 66X45 STRL (DRAPES) ×1 IMPLANT
DRAPE SHEET LG 3/4 BI-LAMINATE (DRAPES) ×3 IMPLANT
DRAPE U-SHAPE 47X51 STRL (DRAPES) ×1 IMPLANT
DRSG AQUACEL AG ADV 3.5X10 (GAUZE/BANDAGES/DRESSINGS) ×1 IMPLANT
ELECT BLADE TIP CTD 4 INCH (ELECTRODE) ×1 IMPLANT
ELECT REM PT RETURN 15FT ADLT (MISCELLANEOUS) ×1 IMPLANT
GAUZE SPONGE 4X4 12PLY STRL (GAUZE/BANDAGES/DRESSINGS) ×1 IMPLANT
GLOVE BIO SURGEON STRL SZ7 (GLOVE) ×1 IMPLANT
GLOVE BIO SURGEON STRL SZ8.5 (GLOVE) ×2 IMPLANT
GLOVE BIOGEL PI IND STRL 7.5 (GLOVE) ×1 IMPLANT
GLOVE BIOGEL PI IND STRL 8.5 (GLOVE) ×1 IMPLANT
GLOVE BIOGEL PI INDICATOR 7.5 (GLOVE) ×1
GLOVE BIOGEL PI INDICATOR 8.5 (GLOVE) ×1
GOWN SPEC L3 XXLG W/TWL (GOWN DISPOSABLE) ×1 IMPLANT
GOWN STRL REUS W/ TWL XL LVL3 (GOWN DISPOSABLE) ×1 IMPLANT
GOWN STRL REUS W/TWL XL LVL3 (GOWN DISPOSABLE) ×1
HANDPIECE INTERPULSE COAX TIP (DISPOSABLE) ×1
HDLS TROCR DRIL PIN KNEE 75 (PIN) ×1
HOLDER FOLEY CATH W/STRAP (MISCELLANEOUS) ×1 IMPLANT
HOOD PEEL AWAY FLYTE STAYCOOL (MISCELLANEOUS) ×3 IMPLANT
INSERT TIB AS PS EF/3-11 10 RT (Insert) IMPLANT
KIT TURNOVER KIT A (KITS) IMPLANT
MARKER SKIN DUAL TIP RULER LAB (MISCELLANEOUS) ×1 IMPLANT
NDL SAFETY ECLIP 18X1.5 (MISCELLANEOUS) ×1 IMPLANT
NDL SPNL 18GX3.5 QUINCKE PK (NEEDLE) ×1 IMPLANT
NEEDLE SPNL 18GX3.5 QUINCKE PK (NEEDLE) ×1 IMPLANT
NS IRRIG 1000ML POUR BTL (IV SOLUTION) ×1 IMPLANT
PACK TOTAL KNEE CUSTOM (KITS) ×1 IMPLANT
PADDING CAST COTTON 6X4 STRL (CAST SUPPLIES) ×1 IMPLANT
PIN DRILL HDLS TROCAR 75 4PK (PIN) IMPLANT
PROS FEM KNEE PS STD 9 RT (Joint) ×1 IMPLANT
PROS TIB KNEE PS 0D F RT (Joint) ×1 IMPLANT
PROSTHESIS FEM KNEE PS STD9 RT (Joint) IMPLANT
PROSTHESIS TIB KNEE PS 0D F RT (Joint) IMPLANT
PROTECTOR NERVE ULNAR (MISCELLANEOUS) ×1 IMPLANT
SAW OSC TIP CART 19.5X105X1.3 (SAW) ×1 IMPLANT
SCREW FEMALE HEX FIX 25X2.5 (ORTHOPEDIC DISPOSABLE SUPPLIES) IMPLANT
SEALER BIPOLAR AQUA 6.0 (INSTRUMENTS) ×1 IMPLANT
SET HNDPC FAN SPRY TIP SCT (DISPOSABLE) ×1 IMPLANT
SET PAD KNEE POSITIONER (MISCELLANEOUS) ×1 IMPLANT
SOLUTION PRONTOSAN WOUND 350ML (IRRIGATION / IRRIGATOR) IMPLANT
SPIKE FLUID TRANSFER (MISCELLANEOUS) ×2 IMPLANT
STAPLER VISISTAT 35W (STAPLE) IMPLANT
SUT MNCRL AB 3-0 PS2 18 (SUTURE) ×1 IMPLANT
SUT MNCRL AB 4-0 PS2 18 (SUTURE) ×1 IMPLANT
SUT MON AB 2-0 CT1 36 (SUTURE) ×1 IMPLANT
SUT STRATAFIX PDO 1 14 VIOLET (SUTURE) ×1
SUT STRATFX PDO 1 14 VIOLET (SUTURE) ×1
SUT VIC AB 1 CTX 36 (SUTURE) ×2
SUT VIC AB 1 CTX36XBRD ANBCTR (SUTURE) ×2 IMPLANT
SUT VIC AB 2-0 CT1 27 (SUTURE) ×1
SUT VIC AB 2-0 CT1 TAPERPNT 27 (SUTURE) ×1 IMPLANT
SUTURE STRATFX PDO 1 14 VIOLET (SUTURE) ×1 IMPLANT
TRAY FOLEY MTR SLVR 14FR STAT (SET/KITS/TRAYS/PACK) IMPLANT
TRAY FOLEY MTR SLVR 16FR STAT (SET/KITS/TRAYS/PACK) IMPLANT
TUBE SUCTION HIGH CAP CLEAR NV (SUCTIONS) ×1 IMPLANT
WATER STERILE IRR 1000ML POUR (IV SOLUTION) ×2 IMPLANT
WRAP KNEE MAXI GEL POST OP (GAUZE/BANDAGES/DRESSINGS) IMPLANT

## 2022-07-17 NOTE — Anesthesia Procedure Notes (Signed)
Anesthesia Regional Block: Adductor canal block   Pre-Anesthetic Checklist: , timeout performed,  Correct Patient, Correct Site, Correct Laterality,  Correct Procedure, Correct Position, site marked,  Risks and benefits discussed,  Surgical consent,  Pre-op evaluation,  At surgeon's request and post-op pain management  Laterality: Lower and Right  Prep: chloraprep       Needles:  Injection technique: Single-shot  Needle Type: Echogenic Needle     Needle Length: 9cm  Needle Gauge: 22     Additional Needles:   Procedures:,,,, ultrasound used (permanent image in chart),,    Narrative:  Start time: 07/17/2022 7:01 AM End time: 07/17/2022 7:09 AM Injection made incrementally with aspirations every 5 mL.  Performed by: Personally  Anesthesiologist: Barnet Glasgow, MD  Additional Notes: Block assessed prior to surgery. Pt tolerated procedure well.

## 2022-07-17 NOTE — Op Note (Signed)
OPERATIVE REPORT  SURGEON: Rod Can, MD   ASSISTANT: Larene Pickett, PA-C  PREOPERATIVE DIAGNOSIS: Primary Right knee arthritis.   POSTOPERATIVE DIAGNOSIS: Primary Right knee arthritis.   PROCEDURE: Computer assisted Right total knee arthroplasty.   IMPLANTS: Zimmer Persona PPS Cementless CR femur, size 9. Persona 0 degree Spiked Keel OsseoTi Tibia, size F. Vivacit-E polyethelyene insert, size 10 mm, CR. TM standard patella, size 35 mm.  ANESTHESIA:  MAC, Regional, and Spinal  TOURNIQUET TIME: Not utilized.   ESTIMATED BLOOD LOSS:-200 mL    ANTIBIOTICS: 2g Ancef.  DRAINS: None.  COMPLICATIONS: None   CONDITION: PACU - hemodynamically stable.   BRIEF CLINICAL NOTE: Marie Calhoun is a 71 y.o. female with a long-standing history of Right knee arthritis. After failing conservative management, the patient was indicated for total knee arthroplasty. The risks, benefits, and alternatives to the procedure were explained, and the patient elected to proceed.  PROCEDURE IN DETAIL: Adductor canal block was obtained in the pre-op holding area. Once inside the operative room, spinal anesthesia was obtained, and a foley catheter was inserted. The patient was then positioned and the lower extremity was prepped and draped in the normal sterile surgical fashion.  A time-out was called verifying side and site of surgery. The patient received IV antibiotics within 60 minutes of beginning the procedure. A tourniquet was not utilized.   An anterior approach to the knee was performed utilizing a midvastus arthrotomy. A medial release was performed and the patellar fat pad was excised. Stryker imageless navigation was used to cut the distal femur perpendicular to the mechanical axis. A freehand patellar resection was performed, and the patella was sized an prepared with 3 lug holes.  Nagivation was used to make a neutral proximal tibia resection, taking 3 mm of bone from the less affected medial side  with 3 degrees of slope. The menisci were excised. A spacer block was placed, and the alignment and balance in extension were confirmed.   The distal femur was sized using the 3-degree external rotation guide referencing the posterior femoral cortex. The appropriate 4-in-1 cutting block was pinned into place. Rotation was checked using Whiteside's line, the epicondylar axis, and then confirmed with a spacer block in flexion. The remaining femoral cuts were performed, taking care to protect the MCL. Posterior femoral condyle osteophytes were removed with a curved osteotome.  The tibia was sized and the trial tray was pinned into place. The remaining trail components were inserted. The knee was stable to varus and valgus stress through a full range of motion. The patella tracked centrally, and the PCL was well balanced. The trial components were removed, and the proximal tibial surface was prepared. Final components were impacted into place. The knee was tested for a final time and found to be well balanced.   The wound was copiously irrigated with Prontosan solution and normal saline using pule lavage.  Marcaine solution was injected into the periarticular soft tissue.  The wound was closed in layers using #1 Vicryl and Stratafix for the fascia, 2-0 Vicryl for the subcutaneous fat, 2-0 Monocryl for the deep dermal layer, and skin staples. Dermabond was applied to the skin.  Once the glue was fully dried, an Aquacell Ag and compressive dressing were applied.  The patient was transported to the recovery room in stable condition.  Sponge, needle, and instrument counts were correct at the end of the case x2.  The patient tolerated the procedure well and there were no known complications.  Please note that  a surgical assistant was a medical necessity for this procedure in order to perform it in a safe and expeditious manner. Surgical assistant was necessary to retract the ligaments and vital neurovascular  structures to prevent injury to them and also necessary for proper positioning of the limb to allow for anatomic placement of the prosthesis.

## 2022-07-17 NOTE — Transfer of Care (Signed)
Immediate Anesthesia Transfer of Care Note  Patient: Marie Calhoun  Procedure(s) Performed: COMPUTER ASSISTED TOTAL KNEE ARTHROPLASTY (Right: Knee)  Patient Location: PACU  Anesthesia Type:Spinal  Level of Consciousness: awake  Airway & Oxygen Therapy: Patient Spontanous Breathing  Post-op Assessment: Report given to RN  Post vital signs: stable  Last Vitals:  Vitals Value Taken Time  BP 115/78 07/17/22 1008  Temp    Pulse 95 07/17/22 1011  Resp 15 07/17/22 1011  SpO2 90 % 07/17/22 1011  Vitals shown include unvalidated device data.  Last Pain:  Vitals:   07/17/22 0559  TempSrc: Oral  PainSc:       Patients Stated Pain Goal: 4 (84/85/92 7639)  Complications: No notable events documented.

## 2022-07-17 NOTE — Anesthesia Postprocedure Evaluation (Signed)
Anesthesia Post Note  Patient: Marie Calhoun  Procedure(s) Performed: COMPUTER ASSISTED TOTAL KNEE ARTHROPLASTY (Right: Knee)     Patient location during evaluation: Nursing Unit Anesthesia Type: Regional and Spinal Level of consciousness: oriented and awake and alert Pain management: pain level controlled Vital Signs Assessment: post-procedure vital signs reviewed and stable Respiratory status: spontaneous breathing and respiratory function stable Cardiovascular status: blood pressure returned to baseline and stable Postop Assessment: no headache, no backache, no apparent nausea or vomiting and patient able to bend at knees Anesthetic complications: no   No notable events documented.  Last Vitals:  Vitals:   07/17/22 1118 07/17/22 1426  BP: (!) 129/90 122/82  Pulse: 75 72  Resp: 16 18  Temp:  (!) 36.4 C  SpO2: 96% 97%    Last Pain:  Vitals:   07/17/22 1607  TempSrc:   PainSc: [place]

## 2022-07-17 NOTE — Anesthesia Procedure Notes (Addendum)
Spinal  Patient location during procedure: OR Start time: 07/17/2022 7:23 AM End time: 07/17/2022 7:26 AM Reason for block: surgical anesthesia Staffing Performed: anesthesiologist  Anesthesiologist: Barnet Glasgow, MD Performed by: Barnet Glasgow, MD Authorized by: Barnet Glasgow, MD   Preanesthetic Checklist Completed: patient identified, IV checked, risks and benefits discussed, surgical consent, monitors and equipment checked, pre-op evaluation and timeout performed Spinal Block Patient position: sitting Prep: DuraPrep and site prepped and draped Patient monitoring: heart rate, cardiac monitor, continuous pulse ox and blood pressure Approach: midline Location: L3-4 Injection technique: single-shot Needle Needle type: Pencan  Needle gauge: 24 G Needle length: 10 cm Needle insertion depth: 6 cm Assessment Sensory level: T4 Events: CSF return Additional Notes 1 Attempt (s). Pt tolerated procedure well.

## 2022-07-17 NOTE — Discharge Instructions (Signed)
 Dr. Brian Swinteck Total Joint Specialist [facility] [address] [place], [place] [address] [phone]  TOTAL KNEE REPLACEMENT POSTOPERATIVE DIRECTIONS    Knee Rehabilitation, Guidelines Following Surgery  Results after knee surgery are often greatly improved when you follow the exercise, range of motion and muscle strengthening exercises prescribed by your doctor. Safety measures are also important to protect the knee from further injury. Any time any of these exercises cause you to have increased pain or swelling in your knee joint, decrease the amount until you are comfortable again and slowly increase them. If you have problems or questions, call your caregiver or physical therapist for advice.   WEIGHT BEARING Weight bearing as tolerated with assist device (walker, cane, etc) as directed, use it as long as suggested by your surgeon or therapist, typically at least 4-6 weeks.  HOME CARE INSTRUCTIONS  Remove items at home which could result in a fall. This includes throw rugs or furniture in walking pathways.  Continue medications as instructed at time of discharge. You may have some home medications which will be placed on hold until you complete the course of blood thinner medication.  You may start showering once you are discharged home but do not submerge the incision under water. Just pat the incision dry and apply a dry gauze dressing on daily. Walk with walker as instructed.  You may resume a sexual relationship in one month or when given the OK by your doctor.  Use walker as long as suggested by your caregivers. Avoid periods of inactivity such as sitting longer than an hour when not asleep. This helps prevent blood clots.  You may put full weight on your legs and walk as much as is comfortable.  You may return to work once you are cleared by your doctor.  Do not drive a car for 6 weeks or until released by you surgeon.  Do not drive while  taking narcotics.  Wear the elastic stockings for three weeks following surgery during the day but you may remove then at night. Make sure you keep all of your appointments after your operation with all of your doctors and caregivers. You should call the office at the above phone number and make an appointment for approximately two weeks after the date of your surgery. Do not remove your surgical dressing. The dressing is waterproof; you may take showers in 3 days, but do not take tub baths or submerge the dressing. Please pick up a stool softener and laxative for home use as long as you are requiring pain medications. ICE to the affected knee every three hours for 30 minutes at a time and then as needed for pain and swelling.  Continue to use ice on the knee for pain and swelling from surgery. You may notice swelling that will progress down to the foot and ankle.  This is normal after surgery.  Elevate the leg when you are not up walking on it.   It is important for you to complete the blood thinner medication as prescribed by your doctor. Continue to use the breathing machine which will help keep your temperature down.  It is common for your temperature to cycle up and down following surgery, especially at night when you are not up moving around and exerting yourself.  The breathing machine keeps your lungs expanded and your temperature down.  RANGE OF MOTION AND STRENGTHENING EXERCISES  Rehabilitation of the knee is important following a knee injury or an   operation. After just a few days of immobilization, the muscles of the thigh which control the knee become weakened and shrink (atrophy). Knee exercises are designed to build up the tone and strength of the thigh muscles and to improve knee motion. Often times heat used for twenty to thirty minutes before working out will loosen up your tissues and help with improving the range of motion but do not use heat for the first two weeks following surgery.  These exercises can be done on a training (exercise) mat, on the floor, on a table or on a bed. Use what ever works the best and is most comfortable for you Knee exercises include:  Leg Lifts - While your knee is still immobilized in a splint or cast, you can do straight leg raises. Lift the leg to 60 degrees, hold for 3 sec, and slowly lower the leg. Repeat 10-20 times 2-3 times daily. Perform this exercise against resistance later as your knee gets better.  Quad and Hamstring Sets - Tighten up the muscle on the front of the thigh (Quad) and hold for 5-10 sec. Repeat this 10-20 times hourly. Hamstring sets are done by pushing the foot backward against an object and holding for 5-10 sec. Repeat as with quad sets.  A rehabilitation program following serious knee injuries can speed recovery and prevent re-injury in the future due to weakened muscles. Contact your doctor or a physical therapist for more information on knee rehabilitation.   POST-OPERATIVE OPIOID TAPER INSTRUCTIONS: It is important to wean off of your opioid medication as soon as possible. If you do not need pain medication after your surgery it is ok to stop day one. Opioids include: Codeine, Hydrocodone(Norco, Vicodin), Oxycodone(Percocet, oxycontin) and hydromorphone amongst others.  Long term and even short term use of opiods can cause: Increased pain response Dependence Constipation Depression Respiratory depression And more.  Withdrawal symptoms can include Flu like symptoms Nausea, vomiting And more Techniques to manage these symptoms Hydrate well Eat regular healthy meals Stay active Use relaxation techniques(deep breathing, meditating, yoga) Do Not substitute Alcohol to help with tapering If you have been on opioids for less than two weeks and do not have pain than it is ok to stop all together.  Plan to wean off of opioids This plan should start within one week post op of your joint replacement. Maintain the same  interval or time between taking each dose and first decrease the dose.  Cut the total daily intake of opioids by one tablet each day Next start to increase the time between doses. The last dose that should be eliminated is the evening dose.    SKILLED REHAB INSTRUCTIONS: If the patient is transferred to a skilled rehab facility following release from the hospital, a list of the current medications will be sent to the facility for the patient to continue.  When discharged from the skilled rehab facility, please have the facility set up the patient's Home Health Physical Therapy prior to being released. Also, the skilled facility will be responsible for providing the patient with their medications at time of release from the facility to include their pain medication, the muscle relaxants, and their blood thinner medication. If the patient is still at the rehab facility at time of the two week follow up appointment, the skilled rehab facility will also need to assist the patient in arranging follow up appointment in our office and any transportation needs.  MAKE SURE YOU:  Understand these instructions.  Will watch   your condition.  Will get help right away if you are not doing well or get worse.    Pick up stool softner and laxative for home use following surgery while on pain medications. Do NOT remove your dressing. You may shower.  Do not take tub baths or submerge incision under water. May shower starting three days after surgery. Please use a clean towel to pat the incision dry following showers. Continue to use ice for pain and swelling after surgery. Do not use any lotions or creams on the incision until instructed by your surgeon.  

## 2022-07-17 NOTE — Evaluation (Signed)
Physical Therapy Evaluation Patient Details Name: Marie Calhoun MRN: [id number] DOB: 07-07-1951 Today's Date: 07/17/2022  History of Present Illness  Pt is a 71yo female presenting s/p R-TKA on 07/17/22. 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   Clinical Impression  Marie Calhoun is a 71 y.o. female POD 0 s/p R-TKA. Patient reports modified independence using RW for mobility at baseline; pt recently completed rehabilitation for R-THA, AA. Patient is now limited by functional impairments (see PT problem list below) and requires min guard for bed mobility and for transfers. Patient was able to ambulate 5 feet with RW and min guard level of assist, distance limited by R knee buckling. Patient instructed in exercise to facilitate ROM and circulation to manage edema. Provided incentive spirometer and with Vcs pt able to achieve 2062m. Patient will benefit from continued skilled PT interventions to address impairments and progress towards PLOF. Acute PT will follow to progress mobility and stair training in preparation for safe discharge home.       Recommendations for follow up therapy are one component of a multi-disciplinary discharge planning process, led by the attending physician.  Recommendations may be updated based on patient status, additional functional criteria and insurance authorization.  Follow Up Recommendations Follow physician's recommendations for discharge plan and follow up therapies      Assistance Recommended at Discharge Intermittent Supervision/Assistance  Patient can return home with the following  A little help with walking and/or transfers;A little help with bathing/dressing/bathroom;Assistance with cooking/housework;Assist for transportation;Help with stairs or ramp for entrance    Equipment Recommendations None recommended by PT  Recommendations for Other Services       Functional Status Assessment Patient has had a  recent decline in their functional status and demonstrates the ability to make significant improvements in function in a reasonable and predictable amount of time.     Precautions / Restrictions Precautions Precautions: Fall Restrictions Weight Bearing Restrictions: Yes RLE Weight Bearing: Weight bearing as tolerated      Mobility  Bed Mobility Overal bed mobility: Needs Assistance Bed Mobility: Supine to Sit     Supine to sit: Min guard     General bed mobility comments: min guard for safety, pt utilizes self-assist technique with gait belt with increased time.    Transfers Overall transfer level: Needs assistance Equipment used: Rolling walker (2 wheels) Transfers: Sit to/from Stand Sit to Stand: Min guard, From elevated surface           General transfer comment: Min guard for safety, no physical assist required from elevated surface, multimodal cues for sequencing, hand placement, and powering up through LLE and BUE.    Ambulation/Gait Ambulation/Gait assistance: Min guard Gait Distance (Feet): 5 Feet Assistive device: Rolling walker (2 wheels) Gait Pattern/deviations: Step-to pattern, Knees buckling, Knee flexed in stance - right, Decreased stance time - right Gait velocity: decreased     General Gait Details: Pt ambulated with RW and min guard, no physical assist required or overt LOB noted. Pt demonstrated mild R knee buckling so directed pt to complete stand to sit transfer into recliner, further mobility deferred.  Stairs            Wheelchair Mobility    Modified Rankin (Stroke Patients Only)       Balance Overall balance assessment: Needs assistance Sitting-balance support: Feet supported, No upper extremity supported Sitting balance-Leahy Scale: Good     Standing balance support: Reliant on assistive device for balance, During functional activity, Bilateral  upper extremity supported Standing balance-Leahy Scale: Poor                                Pertinent Vitals/Pain Pain Assessment Pain Assessment: 0-10 Pain Score: 10-Worst pain ever Pain Location: right knee Pain Descriptors / Indicators: Operative site guarding, Sore, Burning Pain Intervention(s): Limited activity within patient's tolerance, Monitored during session, Repositioned, Ice applied    Home Living Family/patient expects to be discharged to:: Private residence Living Arrangements: Alone Available Help at Discharge: Friend(s);Available 24 hours/day;Personal care attendant;Neighbor (Friends Joaquim Lai and Elmon Kirschner, and Peerless, sitter planned 9-2 M-F) Type of Home: House Home Access: Stairs to enter Entrance Stairs-Rails: Right Technical brewer of Steps: 1   Home Layout: One level;Laundry or work area in basement (12 steps R side railing the entirety, L side on 8 steps) Home Equipment: Cane - single point;Other (comment);Rollator (4 wheels);BSC/3in1;Shower seat;Hand held shower head;Shower seat - built in;Crutches      Prior Function Prior Level of Function : Independent/Modified Independent             Mobility Comments: RW ADLs Comments: ind     Hand Dominance        Extremity/Trunk Assessment   Upper Extremity Assessment Upper Extremity Assessment: Overall WFL for tasks assessed    Lower Extremity Assessment Lower Extremity Assessment: RLE deficits/detail;LLE deficits/detail RLE Deficits / Details: MMT ank DF/PF 3+/5, No extensor lag noted, pt has typical presentation for CMT in R foot RLE Sensation: WNL LLE Deficits / Details: MMT ank DF/PF 4/5, pt has typical presentation for CMT in l foot LLE Sensation: WNL    Cervical / Trunk Assessment Cervical / Trunk Assessment: Kyphotic  Communication   Communication: No difficulties  Cognition Arousal/Alertness: Awake/alert Behavior During Therapy: WFL for tasks assessed/performed Overall Cognitive Status: Within Functional Limits for tasks assessed                                           General Comments General comments (skin integrity, edema, etc.): Toy Cookey present for session    Exercises Total Joint Exercises Ankle Circles/Pumps: AROM, Both, 10 reps, Seated   Assessment/Plan    PT Assessment Patient needs continued PT services  PT Problem List Decreased strength;Decreased range of motion;Decreased activity tolerance;Decreased balance;Decreased mobility;Decreased coordination;Pain       PT Treatment Interventions Gait training;Stair training;Functional mobility training;Therapeutic activities;Therapeutic exercise;Balance training;Neuromuscular re-education;Patient/family education;DME instruction    PT Goals (Current goals can be found in the Care Plan section)  Acute Rehab PT Goals Patient Stated Goal: to walk better PT Goal Formulation: With patient Time For Goal Achievement: 07/24/22 Potential to Achieve Goals: Good    Frequency 7X/week     Co-evaluation               AM-PAC PT "6 Clicks" Mobility  Outcome Measure Help needed turning from your back to your side while in a flat bed without using bedrails?: None Help needed moving from lying on your back to sitting on the side of a flat bed without using bedrails?: A Little Help needed moving to and from a bed to a chair (including a wheelchair)?: A Little Help needed standing up from a chair using your arms (e.g., wheelchair or bedside chair)?: A Little Help needed to walk in hospital room?: A Little Help needed climbing 3-5 steps  with a railing? : A Little 6 Click Score: 19    End of Session Equipment Utilized During Treatment: Gait belt Activity Tolerance: Patient limited by pain Patient left: in chair;with call bell/phone within reach;with chair alarm set;with family/visitor present;with SCD's reapplied Nurse Communication: Mobility status PT Visit Diagnosis: Pain;Difficulty in walking, not elsewhere classified (R26.2) Pain - Right/Left:  Right Pain - part of body: Knee    Time: 9169-4503 PT Time Calculation (min) (ACUTE ONLY): 37 min   Charges:   PT Evaluation $PT Eval Low Complexity: 1 Low PT Treatments $Gait Training: 8-22 mins        Coolidge Breeze, PT, DPT [facility] Office: [phone] Pager: [phone]  Coolidge Breeze 07/17/2022, 1:29 PM

## 2022-07-17 NOTE — Interval H&P Note (Signed)
History and Physical Interval Note:  07/17/2022 7:24 AM  Marie Calhoun  has presented today for surgery, with the diagnosis of Right knee osteoarthriitis.  The various methods of treatment have been discussed with the patient and family. After consideration of risks, benefits and other options for treatment, the patient has consented to  Procedure(s) with comments: [place] (Right) - 150 as a surgical intervention.  The patient's history has been reviewed, patient examined, no change in status, stable for surgery.  I have reviewed the patient's chart and labs.  Questions were answered to the patient's satisfaction.    The risks, benefits, and alternatives were discussed with the patient. There are risks associated with the surgery including, but not limited to, problems with anesthesia (death), infection, instability (giving out of the joint), dislocation, differences in leg length/angulation/rotation, fracture of bones, loosening or failure of implants, hematoma (blood accumulation) which may require surgical drainage, blood clots, pulmonary embolism, nerve injury (foot drop and lateral thigh numbness), and blood vessel injury. The patient understands these risks and elects to proceed.    Hilton Cork Mardee Clune

## 2022-07-17 NOTE — Plan of Care (Signed)

## 2022-07-18 ENCOUNTER — Encounter (HOSPITAL_COMMUNITY): Payer: Self-pay | Admitting: Orthopedic Surgery

## 2022-07-18 ENCOUNTER — Inpatient Hospital Stay (HOSPITAL_COMMUNITY): Payer: Medicare Other

## 2022-07-18 DIAGNOSIS — F419 Anxiety disorder, unspecified: Secondary | ICD-10-CM | POA: Diagnosis present

## 2022-07-18 DIAGNOSIS — K567 Ileus, unspecified: Secondary | ICD-10-CM | POA: Diagnosis not present

## 2022-07-18 DIAGNOSIS — I10 Essential (primary) hypertension: Secondary | ICD-10-CM | POA: Diagnosis present

## 2022-07-18 DIAGNOSIS — Z96641 Presence of right artificial hip joint: Secondary | ICD-10-CM | POA: Diagnosis present

## 2022-07-18 DIAGNOSIS — D649 Anemia, unspecified: Secondary | ICD-10-CM | POA: Diagnosis not present

## 2022-07-18 DIAGNOSIS — K7689 Other specified diseases of liver: Secondary | ICD-10-CM | POA: Diagnosis present

## 2022-07-18 DIAGNOSIS — J9811 Atelectasis: Secondary | ICD-10-CM | POA: Diagnosis not present

## 2022-07-18 DIAGNOSIS — E162 Hypoglycemia, unspecified: Secondary | ICD-10-CM | POA: Diagnosis present

## 2022-07-18 DIAGNOSIS — I472 Ventricular tachycardia, unspecified: Secondary | ICD-10-CM | POA: Diagnosis not present

## 2022-07-18 DIAGNOSIS — R000 Tachycardia, unspecified: Secondary | ICD-10-CM | POA: Diagnosis not present

## 2022-07-18 DIAGNOSIS — E869 Volume depletion, unspecified: Secondary | ICD-10-CM | POA: Diagnosis present

## 2022-07-18 DIAGNOSIS — K219 Gastro-esophageal reflux disease without esophagitis: Secondary | ICD-10-CM | POA: Diagnosis present

## 2022-07-18 DIAGNOSIS — Z79899 Other long term (current) drug therapy: Secondary | ICD-10-CM | POA: Diagnosis not present

## 2022-07-18 DIAGNOSIS — J45909 Unspecified asthma, uncomplicated: Secondary | ICD-10-CM | POA: Diagnosis present

## 2022-07-18 DIAGNOSIS — Z85828 Personal history of other malignant neoplasm of skin: Secondary | ICD-10-CM | POA: Diagnosis not present

## 2022-07-18 DIAGNOSIS — L930 Discoid lupus erythematosus: Secondary | ICD-10-CM | POA: Diagnosis present

## 2022-07-18 DIAGNOSIS — N179 Acute kidney failure, unspecified: Secondary | ICD-10-CM | POA: Diagnosis present

## 2022-07-18 DIAGNOSIS — E876 Hypokalemia: Secondary | ICD-10-CM | POA: Diagnosis not present

## 2022-07-18 DIAGNOSIS — E7800 Pure hypercholesterolemia, unspecified: Secondary | ICD-10-CM | POA: Diagnosis present

## 2022-07-18 DIAGNOSIS — Z791 Long term (current) use of non-steroidal anti-inflammatories (NSAID): Secondary | ICD-10-CM | POA: Diagnosis not present

## 2022-07-18 DIAGNOSIS — G600 Hereditary motor and sensory neuropathy: Secondary | ICD-10-CM | POA: Diagnosis present

## 2022-07-18 DIAGNOSIS — R0902 Hypoxemia: Secondary | ICD-10-CM | POA: Diagnosis not present

## 2022-07-18 DIAGNOSIS — M1711 Unilateral primary osteoarthritis, right knee: Secondary | ICD-10-CM | POA: Diagnosis present

## 2022-07-18 DIAGNOSIS — F32A Depression, unspecified: Secondary | ICD-10-CM | POA: Diagnosis present

## 2022-07-18 LAB — BLOOD GAS, ARTERIAL
Acid-Base Excess: 1.4 mmol/L (ref 0.0–2.0)
Bicarbonate: 24.2 mmol/L (ref 20.0–28.0)
Drawn by: 30860
O2 Saturation: 96.8 %
Patient temperature: 37.6
pCO2 arterial: 32 mmHg (ref 32–48)
pH, Arterial: 7.49 — ABNORMAL HIGH (ref 7.35–7.45)
pO2, Arterial: 71 mmHg — ABNORMAL LOW (ref 83–108)

## 2022-07-18 LAB — BASIC METABOLIC PANEL
Anion gap: 8 (ref 5–15)
Anion gap: 7 (ref 5–15)
BUN: 18 mg/dL (ref 8–23)
BUN: 17 mg/dL (ref 8–23)
CO2: 23 mmol/L (ref 22–32)
CO2: 23 mmol/L (ref 22–32)
Calcium: 8.5 mg/dL — ABNORMAL LOW (ref 8.9–10.3)
Calcium: 8.6 mg/dL — ABNORMAL LOW (ref 8.9–10.3)
Chloride: 107 mmol/L (ref 98–111)
Chloride: 106 mmol/L (ref 98–111)
Creatinine, Ser: 1.09 mg/dL — ABNORMAL HIGH (ref 0.44–1.00)
Creatinine, Ser: 1.1 mg/dL — ABNORMAL HIGH (ref 0.44–1.00)
GFR, Estimated: 55 mL/min — ABNORMAL LOW (ref >60)
GFR, Estimated: 54 mL/min — ABNORMAL LOW (ref >60)
Glucose, Bld: 112 mg/dL — ABNORMAL HIGH (ref 70–99)
Glucose, Bld: 150 mg/dL — ABNORMAL HIGH (ref 70–99)
Potassium: 3.5 mmol/L (ref 3.5–5.1)
Potassium: 3.1 mmol/L — ABNORMAL LOW (ref 3.5–5.1)
Sodium: 138 mmol/L (ref 135–145)
Sodium: 136 mmol/L (ref 135–145)

## 2022-07-18 LAB — CBC
HCT: 29.4 % — ABNORMAL LOW (ref 36.0–46.0)
HCT: 30.7 % — ABNORMAL LOW (ref 36.0–46.0)
Hemoglobin: 9.6 g/dL — ABNORMAL LOW (ref 12.0–15.0)
Hemoglobin: 10.2 g/dL — ABNORMAL LOW (ref 12.0–15.0)
MCH: 32.1 pg (ref 26.0–34.0)
MCH: 32.5 pg (ref 26.0–34.0)
MCHC: 32.7 g/dL (ref 30.0–36.0)
MCHC: 33.2 g/dL (ref 30.0–36.0)
MCV: 98.3 fL (ref 80.0–100.0)
MCV: 97.8 fL (ref 80.0–100.0)
Platelets: 218 10*3/uL (ref 150–400)
Platelets: 194 10*3/uL (ref 150–400)
RBC: 2.99 MIL/uL — ABNORMAL LOW (ref 3.87–5.11)
RBC: 3.14 MIL/uL — ABNORMAL LOW (ref 3.87–5.11)
RDW: 12.3 % (ref 11.5–15.5)
RDW: 12.4 % (ref 11.5–15.5)
WBC: 8.2 10*3/uL (ref 4.0–10.5)
WBC: 7.3 10*3/uL (ref 4.0–10.5)
nRBC: 0 % (ref 0.0–0.2)
nRBC: 0 % (ref 0.0–0.2)

## 2022-07-18 LAB — GLUCOSE, CAPILLARY
Glucose-Capillary: 47 mg/dL — ABNORMAL LOW (ref 70–99)
Glucose-Capillary: 84 mg/dL (ref 70–99)
Glucose-Capillary: 116 mg/dL — ABNORMAL HIGH (ref 70–99)

## 2022-07-18 MED ORDER — METHOCARBAMOL 500 MG PO TABS: 500.0000 mg | ORAL_TABLET | Freq: Four times a day (QID) | ORAL | 0 refills | Status: DC | PRN

## 2022-07-18 MED ORDER — DOCUSATE SODIUM 100 MG PO CAPS: 100.0000 mg | ORAL_CAPSULE | Freq: Two times a day (BID) | ORAL | 0 refills | Status: AC

## 2022-07-18 MED ORDER — OXYCODONE HCL 5 MG PO TABS: 5.0000 mg | ORAL_TABLET | ORAL | 0 refills | Status: AC | PRN

## 2022-07-18 MED ORDER — ASPIRIN 81 MG PO CHEW: 81.0000 mg | CHEWABLE_TABLET | Freq: Two times a day (BID) | ORAL | 0 refills | Status: AC

## 2022-07-18 MED ORDER — SENNA 8.6 MG PO TABS: 2.0000 | ORAL_TABLET | Freq: Every day | ORAL | 0 refills | Status: AC

## 2022-07-18 MED ORDER — POTASSIUM CHLORIDE 10 MEQ/100ML IV SOLN
10.0000 meq | INTRAVENOUS | Status: AC
  Administered 2022-07-18 – 2022-07-19 (×4): 10 meq via INTRAVENOUS
  Filled 2022-07-18 (×4): qty 100

## 2022-07-18 MED ORDER — ONDANSETRON HCL 4 MG PO TABS: 4.0000 mg | ORAL_TABLET | Freq: Three times a day (TID) | ORAL | 0 refills | Status: DC | PRN

## 2022-07-18 MED ORDER — POLYETHYLENE GLYCOL 3350 17 G PO PACK: 17.0000 g | PACK | Freq: Every day | ORAL | 0 refills | Status: AC | PRN

## 2022-07-18 NOTE — TOC Transition Note (Signed)
Transition of Care [facility]) - CM/SW Discharge Note   Patient Details  Name: Marie Calhoun MRN: [id number] Date of Birth: Apr 13, 1951  Transition of Care [facility]) CM/SW Contact:  Lennart Pall, LCSW Phone Number: 07/18/2022, 9:49 AM   Clinical Narrative:    Met with pt and confirming she has all needed DME at home.  OPPT already arranged with [facility].  NO TOC needs.   Final next level of care: OP Rehab Barriers to Discharge: No Barriers Identified   Patient Goals and CMS Choice Patient states their goals for this hospitalization and ongoing recovery are:: return home      Discharge Placement                       Discharge Plan and Services                DME Arranged: N/A DME Agency: NA                  Social Determinants of Health (SDOH) Interventions     Readmission Risk Interventions     No data to display

## 2022-07-18 NOTE — Progress Notes (Signed)
   07/18/22 2145  Assess: MEWS Score  Level of Consciousness Alert  Assess: MEWS Score  MEWS Temp 0  MEWS Systolic 0  MEWS Pulse 2  MEWS RR 0  MEWS LOC 0  MEWS Score 2  MEWS Score Color Yellow  Assess: if the MEWS score is Yellow or Red  Were vital signs taken at a resting state? Yes  Focused Assessment No change from prior assessment  Does the patient meet 2 or more of the SIRS criteria? Yes  Does the patient have a confirmed or suspected source of infection? No  MEWS guidelines implemented *See Row Information* Yes  Treat  MEWS Interventions Administered scheduled meds/treatments;Administered prn meds/treatments  Pain Scale 0-10  Pain Score 2  Pain Type Surgical pain  Pain Location Knee  Pain Orientation Right  Pain Descriptors / Indicators Discomfort  Pain Frequency Intermittent  Pain Onset Gradual  Patients Stated Pain Goal 2  Pain Intervention(s) Cold applied  Multiple Pain Sites No  Take Vital Signs  Increase Vital Sign Frequency  Yellow: Q 2hr X 2 then Q 4hr X 2, if remains yellow, continue Q 4hrs  Escalate  MEWS: Escalate Yellow: discuss with charge nurse/RN and consider discussing with provider and RRT  Notify: Charge Nurse/RN  Name of Charge Nurse/RN Notified Kathlyn Sacramento  Date Charge Nurse/RN Notified 07/18/22  Time Charge Nurse/RN Notified 2145  Notify: Provider  Date Provider Notified 07/18/22  Time Provider Notified 2227  Method of Notification Page  Notification Reason Change in status  Date of Provider Response 07/18/22  Document  Progress note created (see row info) Yes   Pt resting comfortably in bed, no complaints of chest pain or SOB or pain at sx sight. Notified on call for Dr.Swinteck. Received call back from [facility] AP-C. Resume pts fluids and run throughout the night, otherwise just continue to monitor.

## 2022-07-18 NOTE — Progress Notes (Signed)
Physical Therapy Treatment Patient Details Name: Marie Calhoun MRN: [id number] DOB: 02-05-1951 Today's Date: 07/18/2022   History of Present Illness Pt is a 71yo female presenting s/p R-TKA on 07/17/22. 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    PT Comments    General Comments: AxO x 3 very pleasant Lady.  Nervous about how she is dong.  Requires positive reinforcement. Assisted with amb in hallway a decreased distance due to increased c/o pain 7/10.  Assisted to bathroom to void.  LOB x 2 during self peri standing Therapist recovered.  Pt instructed to "slow down" and "get steady" before hygiene task.  VC's also for safety with turns.  Pt impulsive.  Assisted back to bed and performed a few TE's followed by ICE wrap.   Pt did NOT meet her mobility goals to safely D/C top home today.   Recommendations for follow up therapy are one component of a multi-disciplinary discharge planning process, led by the attending physician.  Recommendations may be updated based on patient status, additional functional criteria and insurance authorization.  Follow Up Recommendations  Follow physician's recommendations for discharge plan and follow up therapies     Assistance Recommended at Discharge Intermittent Supervision/Assistance  Patient can return home with the following A little help with walking and/or transfers;A little help with bathing/dressing/bathroom;Assistance with cooking/housework;Assist for transportation;Help with stairs or ramp for entrance   Equipment Recommendations  None recommended by PT    Recommendations for Other Services       Precautions / Restrictions Precautions Precautions: Fall Precaution Comments: instructed no pillow under knee Restrictions Weight Bearing Restrictions: No RLE Weight Bearing: Weight bearing as tolerated     Mobility  Bed Mobility Overal bed mobility: Needs Assistance Bed Mobility: Sit to Supine      Supine to sit: Min guard, Supervision Sit to supine: Min guard, Min assist   General bed mobility comments: demonstarted and instructed how to use a belt to self assist LE    Transfers Overall transfer level: Needs assistance Equipment used: Rolling walker (2 wheels) Transfers: Sit to/from Stand Sit to Stand: Supervision, Min guard           General transfer comment: 25% VC's on proper hand placement and safety with turns.  Also assisted with a toilet transfer.    Ambulation/Gait Ambulation/Gait assistance: Supervision, Min guard Gait Distance (Feet): 25 Feet Assistive device: Rolling walker (2 wheels) Gait Pattern/deviations: Step-to pattern, Knees buckling, Knee flexed in stance - right, Decreased stance time - right Gait velocity: decreased     General Gait Details: decreased amb distance due to increased pain 7/10.  25% VC's on proper walker to self distance and safety with turns.   Stairs             Wheelchair Mobility    Modified Rankin (Stroke Patients Only)       Balance                                            Cognition Arousal/Alertness: Awake/alert Behavior During Therapy: WFL for tasks assessed/performed Overall Cognitive Status: Within Functional Limits for tasks assessed                                 General Comments: AxO x 3 very pleasant Lady.  Nervous  about how she is dong.  Requires positive reinforcement.        Exercises   10 reps AP, knee presses and SLR    General Comments        Pertinent Vitals/Pain Pain Assessment Pain Assessment: 0-10 Pain Score: 7  Pain Location: right knee Pain Descriptors / Indicators: Operative site guarding, Sore, Burning, Tender Pain Intervention(s): Monitored during session, Premedicated before session, Repositioned, Ice applied    Home Living                          Prior Function            PT Goals (current goals can now be found  in the care plan section) Progress towards PT goals: Progressing toward goals    Frequency    7X/week      PT Plan Current plan remains appropriate    Co-evaluation              AM-PAC PT "6 Clicks" Mobility   Outcome Measure  Help needed turning from your back to your side while in a flat bed without using bedrails?: A Little Help needed moving from lying on your back to sitting on the side of a flat bed without using bedrails?: A Little Help needed moving to and from a bed to a chair (including a wheelchair)?: A Little Help needed standing up from a chair using your arms (e.g., wheelchair or bedside chair)?: A Little Help needed to walk in hospital room?: A Little Help needed climbing 3-5 steps with a railing? : A Little 6 Click Score: 18    End of Session Equipment Utilized During Treatment: Gait belt Activity Tolerance: Patient limited by fatigue;No increased pain Patient left: in bed;with call bell/phone within reach;with bed alarm set Nurse Communication: Mobility status PT Visit Diagnosis: Pain;Difficulty in walking, not elsewhere classified (R26.2) Pain - Right/Left: Right Pain - part of body: Knee     Time: 1300-1326 PT Time Calculation (min) (ACUTE ONLY): 26 min  Charges:  $Gait Training: 8-22 mins $Therapeutic Activity: 8-22 mins                     Rica Koyanagi  PTA [place] [facility] M-F          [phone] Weekend pager [phone]

## 2022-07-18 NOTE — Progress Notes (Signed)
Assisted patient to bathroom; Noted patient shaking and patient stated she felt shaky all over for past few minutes; vital signs obtained and oxygen level at 80% on room air; continuous monitoring reconnected; oxygen at 2L nasal cannula started; oxygen level at 96%; will continue to monitor

## 2022-07-18 NOTE — Significant Event (Signed)
Rapid Response Event Note   Reason for Call :  New shaking and weakness  Initial Focused Assessment:  Patient in bed alert and oriented x4. No signs of respiratory distress. Per bedside RN, new O2 requirements of 2L- lungs clear bilaterally and denied shortness of breath and chest pain on assessment. Per RN, O2 requirements began after patient walked to bathroom along with increased shaking of arms/hands.   Vitals SpO2 96% 2L [place], HR 110, BP 147/77, temperature 99.1.   CBG 47. 8 oz juice given. Recheck CBG 84. Patient educated on signs of hypoglycemia and what to drink/eat if symptoms occur.     Interventions:  Hypoglycemia protocol followed- 8 oz juice.  Chest x ray, CBC and BMP ordered per A. Hill PA. Orders to be placed for CBG checks also.   Event Summary:   MD Notified: [place] MD Paged from [place]. Hill PA called back w orders Call Time: 1800 Arrival Time: [place] End Time: 1830  Josph Macho, RN

## 2022-07-18 NOTE — Progress Notes (Signed)
Physical Therapy Treatment Patient Details Name: Marie Calhoun MRN: [id number] DOB: [birth year] Today's Date: 07/18/2022   History of Present Illness Pt is a 71yo female presenting s/p R-TKA on 07/17/22. 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    PT Comments    POD #1 am session Pt is AxO x 3 very pleasant.  Assisted OOB with increased time and assisted with amb in hallway a functional distance 32 feet. Then returned to room to perform some TE's following HEP handout.  Instructed on proper tech, freq as well as use of ICE.   Will see pt this afternoon for stair training and complete HEP.     Recommendations for follow up therapy are one component of a multi-disciplinary discharge planning process, led by the attending physician.  Recommendations may be updated based on patient status, additional functional criteria and insurance authorization.  Follow Up Recommendations  Follow physician's recommendations for discharge plan and follow up therapies     Assistance Recommended at Discharge    Patient can return home with the following A little help with walking and/or transfers;A little help with bathing/dressing/bathroom;Assistance with cooking/housework;Assist for transportation;Help with stairs or ramp for entrance   Equipment Recommendations  None recommended by PT    Recommendations for Other Services       Precautions / Restrictions Precautions Precautions: Fall Precaution Comments: instructed no pillow under knee Restrictions Weight Bearing Restrictions: No RLE Weight Bearing: Weight bearing as tolerated     Mobility  Bed Mobility Overal bed mobility: Needs Assistance Bed Mobility: Supine to Sit     Supine to sit: Min guard, Supervision     General bed mobility comments: demonstarted and instructed how to use a belt to self assist LE    Transfers Overall transfer level: Needs assistance Equipment used: Rolling  walker (2 wheels) Transfers: Sit to/from Stand Sit to Stand: Supervision, Min guard           General transfer comment: 25% VC's on proper hand placement and safety with turns.    Ambulation/Gait Ambulation/Gait assistance: Supervision, Min guard Gait Distance (Feet): 32 Feet Assistive device: Rolling walker (2 wheels) Gait Pattern/deviations: Step-to pattern, Knees buckling, Knee flexed in stance - right, Decreased stance time - right Gait velocity: decreased     General Gait Details: 25% VC's on proper sequencing as well as upright posture.  Pt tolerated amb a functional distance of 32 feet with mild c/o fatigue.   Stairs             Wheelchair Mobility    Modified Rankin (Stroke Patients Only)       Balance                                            Cognition Arousal/Alertness: Awake/alert Behavior During Therapy: WFL for tasks assessed/performed Overall Cognitive Status: Within Functional Limits for tasks assessed                                 General Comments: AxO x 3 very pleasant Lady        Exercises  Total Knee Replacement TE's following HEP handout 10 reps B LE ankle pumps 05 reps towel squeezes 05 reps knee presses 05 reps heel slides  05 reps SAQ's 05 reps SLR's 05 reps ABD Educated  on use of gait belt to assist with TE's Followed by ICE     General Comments        Pertinent Vitals/Pain Pain Assessment Pain Assessment: 0-10 Pain Score: 3  Pain Location: right knee Pain Descriptors / Indicators: Operative site guarding, Sore, Burning, Tender Pain Intervention(s): Monitored during session, Premedicated before session, Repositioned, Ice applied    Home Living                          Prior Function            PT Goals (current goals can now be found in the care plan section) Progress towards PT goals: Progressing toward goals    Frequency    7X/week      PT Plan Current  plan remains appropriate    Co-evaluation              AM-PAC PT "6 Clicks" Mobility   Outcome Measure  Help needed turning from your back to your side while in a flat bed without using bedrails?: A Little Help needed moving from lying on your back to sitting on the side of a flat bed without using bedrails?: A Little Help needed moving to and from a bed to a chair (including a wheelchair)?: A Little Help needed standing up from a chair using your arms (e.g., wheelchair or bedside chair)?: A Little Help needed to walk in hospital room?: A Little Help needed climbing 3-5 steps with a railing? : A Little 6 Click Score: 18    End of Session Equipment Utilized During Treatment: Gait belt Activity Tolerance: Patient tolerated treatment well Patient left: in chair;with call bell/phone within reach;with chair alarm set;with family/visitor present;with SCD's reapplied Nurse Communication: Mobility status PT Visit Diagnosis: Pain;Difficulty in walking, not elsewhere classified (R26.2) Pain - Right/Left: Right Pain - part of body: Knee     Time: 4496-7591 PT Time Calculation (min) (ACUTE ONLY): 27 min  Charges:  $Gait Training: 8-22 mins $Therapeutic Exercise: 8-22 mins                     Rica Koyanagi  PTA [place] [facility] M-F          [phone] Weekend pager [phone]

## 2022-07-18 NOTE — Progress Notes (Signed)
Rapid response nurse called to evaluate patient; patient shaking; patient is alert and oriented; per rapid response RN, blood sugar checked and noted 47; apple juice provided to patient; re-check cbg after 15 minutes was 84; Larene Pickett, [place] notified; verbal order received to continue to spot check patient cbg level and continue to monitor

## 2022-07-18 NOTE — Progress Notes (Addendum)
    Subjective:  Patient reports pain as mild to moderate.  Denies N/V/CP/SOB/Abd pain. She is doing okay this morning. She is feeling a little better than yesterday.   Objective:   VITALS:   Vitals:   07/17/22 1426 07/17/22 2231 07/18/22 0141 07/18/22 0514  BP: 122/82 130/80 129/84 129/72  Pulse: 72 (!) 59 80 (!) 58  Resp: '18 18 18 18  '$ Temp: (!) 97.5 F (36.4 C) 97.8 F (36.6 C) 98.4 F (36.9 C) 98.7 F (37.1 C)  TempSrc: Oral Oral Oral Oral  SpO2: 97% 99% 100% 99%  Weight:      Height:        Sitting up in bed. NAD Neurologically intact ABD soft Neurovascular intact Sensation intact distally Intact pulses distally Dorsiflexion/Plantar flexion intact Incision: dressing C/D/I No cellulitis present Compartment soft   Lab Results  Component Value Date   WBC 8.2 07/18/2022   HGB 9.6 (L) 07/18/2022   HCT 29.4 (L) 07/18/2022   MCV 98.3 07/18/2022   PLT 218 07/18/2022   BMET    Component Value Date/Time   NA 138 07/18/2022 0413   K 3.5 07/18/2022 0413   CL 107 07/18/2022 0413   CO2 23 07/18/2022 0413   GLUCOSE 112 (H) 07/18/2022 0413   BUN 18 07/18/2022 0413   CREATININE 1.09 (H) 07/18/2022 0413   CALCIUM 8.5 (L) 07/18/2022 0413   GFRNONAA 55 (L) 07/18/2022 0413     Assessment/Plan: 1 Day Post-Op   Principal Problem:   Osteoarthritis of right knee  Cr 1.09 this morning slight increase since prior 0.95 07/04/22. Discontinued meloxicam and increase fluids. Continue to monitor.   WBAT with walker DVT ppx: Aspirin, SCDs, TEDS PO pain control PT/OT: She ambulated 5 feet with PT yesterday. Continue PT today.  Dispo: D/c  today vs tomorrow depending on PT and patient status. Call Naida Sleight if ready for d/c today.    Charlott Rakes, PA-C 07/18/2022, 7:24 AM   [facility]  [place] [address], [address], [place], [place] [address]

## 2022-07-18 NOTE — Plan of Care (Signed)
  Problem: Pain Management: Goal: Pain level will decrease with appropriate interventions Outcome: Progressing   Problem: Nutrition: Goal: Adequate nutrition will be maintained Outcome: Progressing   

## 2022-07-19 ENCOUNTER — Inpatient Hospital Stay (HOSPITAL_COMMUNITY): Payer: Medicare Other

## 2022-07-19 DIAGNOSIS — R0902 Hypoxemia: Secondary | ICD-10-CM

## 2022-07-19 DIAGNOSIS — E162 Hypoglycemia, unspecified: Secondary | ICD-10-CM

## 2022-07-19 DIAGNOSIS — K769 Liver disease, unspecified: Secondary | ICD-10-CM

## 2022-07-19 DIAGNOSIS — E876 Hypokalemia: Secondary | ICD-10-CM

## 2022-07-19 DIAGNOSIS — M1711 Unilateral primary osteoarthritis, right knee: Secondary | ICD-10-CM

## 2022-07-19 DIAGNOSIS — L932 Other local lupus erythematosus: Secondary | ICD-10-CM

## 2022-07-19 LAB — CBC WITH DIFFERENTIAL/PLATELET
Abs Immature Granulocytes: 0.04 10*3/uL (ref 0.00–0.07)
Basophils Absolute: 0 10*3/uL (ref 0.0–0.1)
Basophils Relative: 0 %
Eosinophils Absolute: 0 10*3/uL (ref 0.0–0.5)
Eosinophils Relative: 0 %
HCT: 28 % — ABNORMAL LOW (ref 36.0–46.0)
Hemoglobin: 9.2 g/dL — ABNORMAL LOW (ref 12.0–15.0)
Immature Granulocytes: 1 %
Lymphocytes Relative: 9 %
Lymphs Abs: 0.8 10*3/uL (ref 0.7–4.0)
MCH: 31.8 pg (ref 26.0–34.0)
MCHC: 32.9 g/dL (ref 30.0–36.0)
MCV: 96.9 fL (ref 80.0–100.0)
Monocytes Absolute: 0.9 10*3/uL (ref 0.1–1.0)
Monocytes Relative: 11 %
Neutro Abs: 6.9 10*3/uL (ref 1.7–7.7)
Neutrophils Relative %: 79 %
Platelets: 184 10*3/uL (ref 150–400)
RBC: 2.89 MIL/uL — ABNORMAL LOW (ref 3.87–5.11)
RDW: 12.4 % (ref 11.5–15.5)
WBC: 8.6 10*3/uL (ref 4.0–10.5)
nRBC: 0 % (ref 0.0–0.2)

## 2022-07-19 LAB — BRAIN NATRIURETIC PEPTIDE: B Natriuretic Peptide: 139.5 pg/mL — ABNORMAL HIGH (ref 0.0–100.0)

## 2022-07-19 LAB — URINALYSIS, ROUTINE W REFLEX MICROSCOPIC
Bacteria, UA: NONE SEEN
Bilirubin Urine: NEGATIVE
Glucose, UA: NEGATIVE mg/dL
Hgb urine dipstick: NEGATIVE
Ketones, ur: NEGATIVE mg/dL
Leukocytes,Ua: NEGATIVE
Nitrite: NEGATIVE
Protein, ur: NEGATIVE mg/dL
Specific Gravity, Urine: 1.026 (ref 1.005–1.030)
pH: 5 (ref 5.0–8.0)

## 2022-07-19 LAB — BASIC METABOLIC PANEL
Anion gap: 4 — ABNORMAL LOW (ref 5–15)
BUN: 15 mg/dL (ref 8–23)
CO2: 25 mmol/L (ref 22–32)
Calcium: 8 mg/dL — ABNORMAL LOW (ref 8.9–10.3)
Chloride: 106 mmol/L (ref 98–111)
Creatinine, Ser: 0.91 mg/dL (ref 0.44–1.00)
GFR, Estimated: >60 mL/min (ref >60)
Glucose, Bld: 110 mg/dL — ABNORMAL HIGH (ref 70–99)
Potassium: 3.8 mmol/L (ref 3.5–5.1)
Sodium: 135 mmol/L (ref 135–145)

## 2022-07-19 LAB — CBC
HCT: 30.9 % — ABNORMAL LOW (ref 36.0–46.0)
Hemoglobin: 9.9 g/dL — ABNORMAL LOW (ref 12.0–15.0)
MCH: 31.6 pg (ref 26.0–34.0)
MCHC: 32 g/dL (ref 30.0–36.0)
MCV: 98.7 fL (ref 80.0–100.0)
Platelets: 187 10*3/uL (ref 150–400)
RBC: 3.13 MIL/uL — ABNORMAL LOW (ref 3.87–5.11)
RDW: 12.5 % (ref 11.5–15.5)
WBC: 8 10*3/uL (ref 4.0–10.5)
nRBC: 0 % (ref 0.0–0.2)

## 2022-07-19 LAB — COMPREHENSIVE METABOLIC PANEL
ALT: 8 U/L (ref 0–44)
AST: 17 U/L (ref 15–41)
Albumin: 2.5 g/dL — ABNORMAL LOW (ref 3.5–5.0)
Alkaline Phosphatase: 78 U/L (ref 38–126)
Anion gap: 6 (ref 5–15)
BUN: 13 mg/dL (ref 8–23)
CO2: 23 mmol/L (ref 22–32)
Calcium: 8 mg/dL — ABNORMAL LOW (ref 8.9–10.3)
Chloride: 105 mmol/L (ref 98–111)
Creatinine, Ser: 0.83 mg/dL (ref 0.44–1.00)
GFR, Estimated: >60 mL/min (ref >60)
Glucose, Bld: 138 mg/dL — ABNORMAL HIGH (ref 70–99)
Potassium: 3.5 mmol/L (ref 3.5–5.1)
Sodium: 134 mmol/L — ABNORMAL LOW (ref 135–145)
Total Bilirubin: 0.6 mg/dL (ref 0.3–1.2)
Total Protein: 5.5 g/dL — ABNORMAL LOW (ref 6.5–8.1)

## 2022-07-19 LAB — GLUCOSE, CAPILLARY
Glucose-Capillary: 54 mg/dL — ABNORMAL LOW (ref 70–99)
Glucose-Capillary: 88 mg/dL (ref 70–99)
Glucose-Capillary: 88 mg/dL (ref 70–99)
Glucose-Capillary: 135 mg/dL — ABNORMAL HIGH (ref 70–99)
Glucose-Capillary: 107 mg/dL — ABNORMAL HIGH (ref 70–99)
Glucose-Capillary: 88 mg/dL (ref 70–99)

## 2022-07-19 LAB — MAGNESIUM: Magnesium: 1.6 mg/dL — ABNORMAL LOW (ref 1.7–2.4)

## 2022-07-19 LAB — CORTISOL: Cortisol, Plasma: 14.5 ug/dL

## 2022-07-19 MED ORDER — MAGNESIUM OXIDE -MG SUPPLEMENT 400 (240 MG) MG PO TABS
400.0000 mg | ORAL_TABLET | Freq: Two times a day (BID) | ORAL | Status: DC
  Administered 2022-07-19 – 2022-07-24 (×11): 400 mg via ORAL
  Filled 2022-07-19 (×11): qty 1

## 2022-07-19 MED ORDER — SODIUM CHLORIDE (PF) 0.9 % IJ SOLN
INTRAMUSCULAR | Status: AC
  Filled 2022-07-19: qty 50

## 2022-07-19 MED ORDER — DEXTROSE-NACL 5-0.9 % IV SOLN
INTRAVENOUS | Status: DC
Start: 2022-07-19 — End: 2022-07-22

## 2022-07-19 MED ORDER — SODIUM CHLORIDE 0.9 % IV BOLUS
500.0000 mL | Freq: Once | INTRAVENOUS | Status: AC
  Administered 2022-07-19: 500 mL via INTRAVENOUS

## 2022-07-19 MED ORDER — IOHEXOL 350 MG/ML SOLN
75.0000 mL | Freq: Once | INTRAVENOUS | Status: AC | PRN
  Administered 2022-07-19: 75 mL via INTRAVENOUS

## 2022-07-19 NOTE — Progress Notes (Signed)
   07/19/22 1537  Assess: MEWS Score  BP 121/75  Pulse Rate (!) 140 (Pt working with Pt)  SpO2 94 %  O2 Device Room Air  Assess: MEWS Score  MEWS Temp 1  MEWS Systolic 0  MEWS Pulse 3  MEWS RR 0  MEWS LOC 0  MEWS Score 4  MEWS Score Color Red  Assess: if the MEWS score is Yellow or Red  Were vital signs taken at a resting state? No (pt getting up with physical therapy.)  Focused Assessment No change from prior assessment  Does the patient meet 2 or more of the SIRS criteria? Yes  Does the patient have a confirmed or suspected source of infection? No  Treat  MEWS Interventions Escalated (See documentation below) (PA A[place]ley notified, Dr Marylyn Ishihara notified)  Escalate  MEWS: Escalate Red: discuss with charge nurse/RN and provider, consider discussing with RRT  Notify: Charge Nurse/RN  Name of Charge Nurse/RN Notified Evelena Peat RN  Date Charge Nurse/RN Notified 07/19/22  Time Charge Nurse/RN Notified 80  Notify: Provider  Provider Name/Title Marylyn Ishihara MD  Date Provider Notified 07/19/22  Time Provider Notified 1545  Method of Notification  (secure chat)  Notification Reason Change in status (tachycardia,)  Provider response See new orders  Date of Provider Response 07/19/22  Time of Provider Response 1545  Assess: SIRS CRITERIA  SIRS Temperature  0  SIRS Pulse 1  SIRS Respirations  0  SIRS WBC 1  SIRS Score Sum  2   Pt alert and oriented. Medications given as ordered. EKG done as ordered. NS Iv 500 bolus given. Pt started on cardiac monitoring.

## 2022-07-19 NOTE — Progress Notes (Signed)
PHYSICAL THERAPY  With held AM PT session due to Angio Chest CT and pt requested to rest.  Will see after lunch  Rica Koyanagi  PTA [place] [facility] M-F          [phone] Weekend pager [phone]

## 2022-07-19 NOTE — Progress Notes (Addendum)
Subjective:  Patient reports pain as mild to moderate.  Denies N/V/CP/SOB/Abd pain. She reports that he knee pain is controlled just some soreness.   Overnight patient has had episodes of hypoglycemia and intermittent hypoxia. Rapid response team was called. Blood glucose normalized after juice was given. She was placed on 2L O2. Chest x-ray, CBC and CMP ordered yesterday. X-ray unremarkable. Potassium low at 3.1, potassium chloride ordered. Her potassium was 3.8 this morning. Patient had another episode of hypoglycemia again this morning, glucose 54 and then 84 after juice.  Hemoglobin stable at 10.2 yesterday and 9.9 this morning.   Patient reports not feeling well and that she get shaky during the episodes. She feels very fatigued this morning.   Objective:   VITALS:   Vitals:   07/18/22 2131 07/18/22 2333 07/19/22 0144 07/19/22 0531  BP: 134/80 128/75 124/66 (!) 149/97  Pulse: (!) 115 (!) 104 95 98  Resp: '18 18 18 18  '$ Temp: 99.7 F (37.6 C) (!) 100.9 F (38.3 C) 98.4 F (36.9 C) 98.3 F (36.8 C)  TempSrc: Oral Oral Oral Oral  SpO2: 94% 100% 99% 98%  Weight:      Height:        Patient is lying in bed. She seems fatigued and not herself this morning.  SPO2 97 on room air during examination. Slight tachycardia at 107. RR 18.  Neurologically intact ABD soft Neurovascular intact Sensation intact distally Intact pulses distally Dorsiflexion/Plantar flexion intact Incision: dressing C/D/I No cellulitis present Compartment soft  Rash on torso and to mid thigh due to her lupus and having to stop methotrexate for surgery that was present prior to admission. No change or worsening noted.    Lab Results  Component Value Date   WBC 8.0 07/19/2022   HGB 9.9 (L) 07/19/2022   HCT 30.9 (L) 07/19/2022   MCV 98.7 07/19/2022   PLT 187 07/19/2022   BMET    Component Value Date/Time   NA 135 07/19/2022 0412   K 3.8 07/19/2022 0412   CL 106 07/19/2022 0412   CO2 25  07/19/2022 0412   GLUCOSE 110 (H) 07/19/2022 0412   BUN 15 07/19/2022 0412   CREATININE 0.91 07/19/2022 0412   CALCIUM 8.0 (L) 07/19/2022 0412   GFRNONAA >60 07/19/2022 0412     Assessment/Plan: 2 Days Post-Op   Principal Problem:   Osteoarthritis of right knee  Overnight patient has had episodes of hypoglycemia and intermittent hypoxia. Rapid response team was called. Blood glucose normalized after juice was given. She was placed on 2L O2. Chest x-ray, CBC and CMP ordered yesterday. X-ray unremarkable.  -Patient had another episode of hypoglycemia again this morning, glucose 54 and then 84 after juice. Consult to hospitalist placed this morning.  -Potassium low at 3.1, potassium chloride ordered. Her potassium improved to 3.8 this morning.  -Hemoglobin stable at 10.2 yesterday and 9.9 this morning.  - Cr 1.10 yesterday, improved to 0.91 today.  - Patient SPO2 97 on room air this morning. Continue to monitor.    WBAT with walker DVT ppx: Aspirin, SCDs, TEDS PO pain control PT/OT: Ambulated 32 feet with PT yesterday. Slight instability with ambulating. Continue PT today.  Hypoxia: ABG obtained yesterday w/ elevated AA gradient, will order CT chest to r/o PE Dispo: Hospitallist consult this morning for intermittent hypoglycemia and hypoxia. Continue physical therapy today. Disposition dependent on hospitalist recommendation and physical therapy. Continue IV fluids.   Charlott Rakes, PA-C 07/19/2022, 8:03 AM  [facility]  [place] [address], [place], [place] [address] Phone: [phone]

## 2022-07-19 NOTE — Progress Notes (Addendum)
Notified of tachycardia and general achiness.   EKG: shows sinus tach; will bolus fluids, start tele  She did have a mild fever earlier. CTA chest was negative. CBC and diff reassuring. ?from atelectasis?  Rpt CBC unchanged. Rpt CMP is stable.  Not sure why she had a tachycardia run. She looks about the same this afternoon and she did earlier today. Continue current fluids. Encourage diet. Also check a UA. We will follow.

## 2022-07-19 NOTE — Consult Note (Signed)
Initial Consultation Note   Patient: Marie Calhoun INO:[id number] DOB: 07/13/1951 PCP: Earney Mallet, MD DOA: 07/17/2022 DOS: the patient was seen and examined on 07/19/2022 Primary service: Rod Can, MD  Referring physician: Dr. Lillia Corporal Reason for consult: Hypoglycemia, hypoxia  Assessment and Plan: Hypoglycemia     - she was nauseous and didn't want to eat yesterday afternoon     - she doesn't have a history of DM or prolonged steroid use     - doesn't present as adrenal insufficiency; but will check a cortisol     - doesn't present as infection/sepsis     - give her a liter of D5NS, follow glucose, encourage diet, PRN anti-emetics for now  Hypoxia Hx of asthma     - lung exam is ok     - CTA shows atelectasis     - encourage IS (I've shown her how to use it)     - PRN nebs     - wean O2 as able  Left hepatic lobe lesion     - as noted on CTA     - check RUQ [place] ab  Cutaneous lupus     - home regimen held for surgery  GERD     - PPI  Hypokalemia     - K+ replaced, check Mg2+  Right knee pain, s/p TRKA     - per primary team  Remainder per primary team   TRH will continue to follow the patient.  HPI: Marie Calhoun is a 71 y.o. female with past medical history of cutaneous lupus, GERD, asthma, Charcot-Marie-Tooth. Presenting with right knee pain. Admitted to the ortho service for a total right knee. She has successfully completed that procedure. Overnight and this morning, she had episodes of hypoglycemia without an apparent cause. She denies any history of DM or prolonged steroid use. She does report that she felt nauseous and didn't want to eat last night. Other than tremors, she denies any other symptoms. She also had some episodes of hypoxia. She was started on 2L [place]. She reports a history of asthma w/ PRN inhaler use. She denies chronic supplemental O2 use. She denies cough or dyspnea at rest. She does report that she feels short of breath when she moves. She  denies any recent peripheral edema.   Review of Systems: As mentioned in the history of present illness. All other systems reviewed and are negative. Past Medical History:  Diagnosis Date   Anxiety    Arthritis    Asthma 05/28/2017   Cancer ([place])    skin cancer   Charcot-Marie-Tooth disease    Depression    Environmental allergies 05/28/2017   Esophageal stricture 05/28/2017   Essential hypertension, benign 05/28/2017   GERD (gastroesophageal reflux disease) 05/28/2017   Hypercholesteremia    Lupus (HCC)    of skin   PONV (postoperative nausea and vomiting)    Seasonal allergies    Past Surgical History:  Procedure Laterality Date   breast tumor\     left benign   CHOLECYSTECTOMY     COLONOSCOPY WITH PROPOFOL N/A 05/30/2021   Procedure: COLONOSCOPY WITH PROPOFOL;  Surgeon: Rogene Houston, MD;  [facility]: [facility];  Service: Endoscopy;  Laterality: N/A;  [place] N/A 09/11/2017   Procedure: ESOPHAGEAL DILATION;  Surgeon: Rogene Houston, MD;  Location: [facility];  Service: Endoscopy;  Laterality: N/A;   ESOPHAGEAL DILATION N/A 06/17/2018   Procedure: ESOPHAGEAL DILATION;  Surgeon: Rogene Houston,  MD;  [facility]: [facility];  Service: Endoscopy;  Laterality: N/A;   ESOPHAGOGASTRODUODENOSCOPY N/A 09/11/2017   Procedure: ESOPHAGOGASTRODUODENOSCOPY (EGD);  Surgeon: Rogene Houston, MD;  [facility];  Service: Endoscopy;  Laterality: N/A;  1:40 - Can NOT come earlier   ESOPHAGOGASTRODUODENOSCOPY N/A 06/17/2018   Procedure: ESOPHAGOGASTRODUODENOSCOPY (EGD);  Surgeon: Rogene Houston, MD;  [facility]: [facility];  Service: Endoscopy;  Laterality: N/A;  10:30   hand tendon release Left    KNEE ARTHROPLASTY Right 07/17/2022   Procedure: COMPUTER ASSISTED TOTAL KNEE ARTHROPLASTY;  Surgeon: Rod Can, MD;  Location: [facility];  Service: Orthopedics;  Laterality: Right;  150   knee surgeries     [place]   POLYPECTOMY  05/30/2021   Procedure: POLYPECTOMY;  Surgeon: Rogene Houston, MD;  Location: [facility];  Service: Endoscopy;;   TOTAL HIP ARTHROPLASTY Right 05/30/2022   Procedure: TOTAL HIP ARTHROPLASTY ANTERIOR APPROACH;  Surgeon: Rod Can, MD;  Location: [facility];  Service: Orthopedics;  Laterality: Right;   Social History:  reports that she has never smoked. She has never been exposed to tobacco smoke. She has never used smokeless tobacco. She reports that she does not drink alcohol and does not use drugs.  Allergies  Allergen Reactions   Doxycycline Itching and Rash   Demerol [Meperidine] Nausea Only    nausea   Pantoprazole Other (See Comments)    Subacute Cutaneous Lupus of the Skin   Plaquenil [Hydroxychloroquine]     Caused another Rash on top of Lupus Rash   Sulfa Antibiotics Swelling    *Childhood*   Sulfamethoxazole-Trimethoprim Other (See Comments)    Childhood    Ranitidine Rash    History reviewed. No pertinent family history.  Prior to Admission medications   Medication Sig Start Date End Date Taking? Authorizing Provider  acyclovir (ZOVIRAX) 400 MG tablet Take 400 mg by mouth 3 (three) times daily as needed (for cold sores/fever blisters (takes for 7 days when needed)).   Yes [provider]  albuterol (VENTOLIN HFA) 108 (90 Base) MCG/ACT inhaler Inhale 2 puffs into the lungs every 6 (six) hours as needed for wheezing or shortness of breath. 05/13/22  Yes Rigoberto Noel, MD  aspirin (ASPIRIN CHILDRENS) 81 MG chewable tablet Chew 1 tablet (81 mg total) by mouth 2 (two) times daily with a meal. 07/18/22 09/01/22 Yes Hill, Marciano Sequin, PA-C  cycloSPORINE (RESTASIS) 0.05 % ophthalmic emulsion Place 1 drop into both eyes 2 (two) times daily.   Yes [provider]  docusate sodium (COLACE) 100 MG capsule Take 1 capsule (100 mg total) by mouth 2 (two) times daily. 07/18/22 08/17/22 Yes Hill, Marciano Sequin, PA-C  gabapentin (NEURONTIN) 300  MG capsule Take 300 mg by mouth in the morning, at noon, and at bedtime. 03/13/21  Yes [provider]  lansoprazole (PREVACID) 30 MG capsule TAKE 1 CAPSULE BY MOUTH ONCE DAILY ON AN EMPTY STOMACH, 30-45 MINUTES BEFORE BREAKFAST 04/10/22  Yes Rehman, Mechele Dawley, MD  linaclotide (LINZESS) 72 MCG capsule TAKE 1 CAPSULE EVERY DAY BEFORE BREAKFAST 04/16/22  Yes Rehman, Mechele Dawley, MD  losartan-hydrochlorothiazide (HYZAAR) 50-12.5 MG tablet Take 0.5 tablets by mouth at bedtime. 02/25/22  Yes [provider]  meloxicam (MOBIC) 15 MG tablet Take 1 tablet (15 mg total) by mouth daily. 05/31/22 05/31/23 Yes Hill, Marciano Sequin, PA-C  methocarbamol (ROBAXIN) 500 MG tablet Take 1  tablet (500 mg total) by mouth every 6 (six) hours as needed for muscle spasms. 07/18/22  Yes Hill, Marciano Sequin, PA-C  mineral oil light external liquid Apply 1 Application topically daily as needed (In ears of needed).   Yes [provider]  ondansetron (ZOFRAN) 4 MG tablet Take 1 tablet (4 mg total) by mouth every 8 (eight) hours as needed for nausea or vomiting. 05/30/22  Yes Swinteck, Aaron Edelman, MD  ondansetron (ZOFRAN) 4 MG tablet Take 1 tablet (4 mg total) by mouth every 8 (eight) hours as needed for nausea or vomiting. 07/18/22 07/18/23 Yes Hill, Marciano Sequin, PA-C  oxyCODONE (ROXICODONE) 5 MG immediate release tablet Take 1 tablet (5 mg total) by mouth every 4 (four) hours as needed for up to 7 days for severe pain. 07/18/22 07/25/22 Yes Hill, Marciano Sequin, PA-C  polyethylene glycol (MIRALAX) 17 g packet Take 17 g by mouth daily as needed for mild constipation or moderate constipation. 07/18/22 08/17/22 Yes Hill, Marciano Sequin, PA-C  senna (SENOKOT) 8.6 MG TABS tablet Take 2 tablets (17.2 mg total) by mouth at bedtime for 15 days. 07/18/22 08/02/22 Yes Hill, Marciano Sequin, PA-C  acetaminophen (TYLENOL) 500 MG tablet Take 2 tablets (1,000 mg total) by mouth every 8 (eight) hours as needed for mild pain, moderate pain or fever. 07/17/22   Charlott Rakes, PA-C   Calcium Carb-Cholecalciferol (CALCIUM 600 + D PO) Take 2 tablets by mouth in the morning.    [provider]  fluticasone (FLONASE) 50 MCG/ACT nasal spray Place 1-2 sprays into both nostrils daily as needed for allergies. 05/13/22   Rigoberto Noel, MD  folic acid (FOLVITE) 1 MG tablet Take 1 mg by mouth daily. 07/04/22   [provider]  ketoconazole (NIZORAL) 2 % cream Apply 1 Application topically daily. Apply to nail and finger around affected area Patient not taking: Reported on 07/01/2022 02/12/22   [provider]  methocarbamol (ROBAXIN) 500 MG tablet Take 1 tablet (500 mg total) by mouth every 6 (six) hours as needed for muscle spasms. Patient not taking: Reported on 07/01/2022 05/31/22   Charlott Rakes, PA-C  methotrexate 2.5 MG tablet Take 7.5 mg by mouth every Wednesday. 07/04/22   [provider]  Multiple Vitamin (MULTIVITAMIN WITH MINERALS) TABS tablet Take 1 tablet by mouth in the morning. Woman's    [provider]  Omega-3 Fatty Acids (FISH OIL) 1000 MG CAPS Take 1,000 mg by mouth in the morning.    [provider]  polyethylene glycol (MIRALAX) 17 g packet Take 17 g by mouth daily as needed for moderate constipation or severe constipation. Patient not taking: Reported on 07/01/2022 05/30/22   Rod Can, MD  senna (SENOKOT) 8.6 MG TABS tablet Take 2 tablets (17.2 mg total) by mouth at bedtime. Patient not taking: Reported on 07/01/2022 05/30/22 07/29/22  Rod Can, MD  silver sulfADIAZINE (SILVADENE) 1 % cream Apply 1 application topically daily as needed (foot wounds/sores).    [provider]  triamcinolone cream (KENALOG) 0.1 % Apply 1 Application topically 3 (three) times daily as needed for irritation or itching. 06/27/22   [provider]  triamcinolone ointment (KENALOG) 0.5 % Apply 1 application topically 2 (two) times daily as needed (skin itching/irritation). 02/07/21   [provider]  vitamin  B-12 (CYANOCOBALAMIN) 1000 MCG tablet Take 2,000 mcg by mouth in the morning.    [provider]    Physical Exam: Vitals:   07/18/22 1601 07/18/22 2333 07/19/22 0144  07/19/22 0531  BP: 134/80 128/75 124/66 (!) 149/97  Pulse: (!) 115 (!) 104 95 98  Resp: '18 18 18 18  '$ Temp: 99.7 F (37.6 C) (!) 100.9 F (38.3 C) 98.4 F (36.9 C) 98.3 F (36.8 C)  TempSrc: Oral Oral Oral Oral  SpO2: 94% 100% 99% 98%  Weight:      Height:       General: 71 y.o. female resting in bed in NAD Eyes: PERRL, normal sclera ENMT: Nares patent w/o discharge, orophaynx clear, dentition normal, ears w/o discharge/lesions/ulcers Neck: Supple, trachea midline Cardiovascular: RRR, +S1, S2, no m/g/r, equal pulses throughout Respiratory: CTABL, no w/r/r, normal WOB GI: BS+, NDNT, no masses noted, no organomegaly noted MSK: No e/c/c Neuro: A&O x 3, no focal deficits Psyc: Appropriate interaction and affect, calm/cooperative  Data Reviewed:   Lab Results  Component Value Date   NA 135 07/19/2022   K 3.8 07/19/2022   CO2 25 07/19/2022   GLUCOSE 110 (H) 07/19/2022   BUN 15 07/19/2022   CREATININE 0.91 07/19/2022   CALCIUM 8.0 (L) 07/19/2022   GFRNONAA >60 07/19/2022   Lab Results  Component Value Date   WBC 8.0 07/19/2022   HGB 9.9 (L) 07/19/2022   HCT 30.9 (L) 07/19/2022   MCV 98.7 07/19/2022   PLT 187 07/19/2022   CTA Chest PA No evidence for acute pulmonary embolus. Indeterminate low-attenuation lesion within the left hepatic lobe. This may potentially represent a cyst however solid mass is not entirely excluded. Consider initial evaluation with right upper quadrant ultrasound. If this is not confirmed as a cyst on ultrasound, MRI would be recommended. Bilateral lower lobe bandlike consolidation favored to represent atelectasis/scarring.  Family Communication: None at bedside Thank you very much for involving [place] in the care of your patient.  Time spent in coordination of this  consult: 58 minutes  Author: Jonnie Finner, DO 07/19/2022 10:17 AM  For on call review [email or web address].

## 2022-07-19 NOTE — Progress Notes (Signed)
Physical Therapy Treatment Patient Details Name: Marie Calhoun MRN: [id number] DOB: 1951-10-17 Today's Date: 07/19/2022   History of Present Illness Pt is a 71yo female presenting s/p R-TKA on 07/17/22. 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    PT Comments    POD # 2 pm session limited General Comments: AxO x 3 very pleasant Lady but feeling "really bad".  Rapid Responce called last night due to an event of uncontrolled tremors and Hypoglycemia. General transfer comment: only stood to side of bed due to increased HR 140.  BP was good at 121/75 and RA 94% . Secure chat sent to RN.  Recommendations for follow up therapy are one component of a multi-disciplinary discharge planning process, led by the attending physician.  Recommendations may be updated based on patient status, additional functional criteria and insurance authorization.  Follow Up Recommendations  Follow physician's recommendations for discharge plan and follow up therapies     Assistance Recommended at Discharge Intermittent Supervision/Assistance  Patient can return home with the following A little help with walking and/or transfers;A little help with bathing/dressing/bathroom;Assistance with cooking/housework;Assist for transportation;Help with stairs or ramp for entrance   Equipment Recommendations  None recommended by PT    Recommendations for Other Services       Precautions / Restrictions Precautions Precautions: Fall Precaution Comments: instructed no pillow under knee Restrictions Weight Bearing Restrictions: No RLE Weight Bearing: Weight bearing as tolerated     Mobility  Bed Mobility Overal bed mobility: Needs Assistance Bed Mobility: Sit to Supine, Supine to Sit     Supine to sit: Min assist Sit to supine: Min assist   General bed mobility comments: required increased assist this session    Transfers Overall transfer level: Needs  assistance Equipment used: Rolling walker (2 wheels) Transfers: Sit to/from Stand Sit to Stand: Min guard, Min assist           General transfer comment: only stood to side of bed due to increased HR 140.  Secure chat sent to RN.    Ambulation/Gait         Gait velocity: decreased     General Gait Details: did NOT amb this afternoon due to increased HR 140.   Stairs             Wheelchair Mobility    Modified Rankin (Stroke Patients Only)       Balance                                            Cognition Arousal/Alertness: Awake/alert Behavior During Therapy: WFL for tasks assessed/performed Overall Cognitive Status: Within Functional Limits for tasks assessed                                 General Comments: AxO x 3 very pleasant Lady but feeling "really bad".  Rapid Responce called last night due to an event of uncontrolled tremors and Hypoglycemia.        Exercises      General Comments        Pertinent Vitals/Pain Pain Assessment Pain Assessment: 0-10 Pain Score: 5  Pain Location: right knee Pain Descriptors / Indicators: Operative site guarding, Sore, Burning, Tender Pain Intervention(s): Monitored during session, Premedicated before session, Repositioned, Ice applied    Home Living  Prior Function            PT Goals (current goals can now be found in the care plan section) Progress towards PT goals: Progressing toward goals    Frequency    7X/week      PT Plan Current plan remains appropriate    Co-evaluation              AM-PAC PT "6 Clicks" Mobility   Outcome Measure  Help needed turning from your back to your side while in a flat bed without using bedrails?: A Little Help needed moving from lying on your back to sitting on the side of a flat bed without using bedrails?: A Little Help needed moving to and from a bed to a chair (including a  wheelchair)?: A Little Help needed standing up from a chair using your arms (e.g., wheelchair or bedside chair)?: A Little Help needed to walk in hospital room?: A Little Help needed climbing 3-5 steps with a railing? : A Little 6 Click Score: 18    End of Session Equipment Utilized During Treatment: Gait belt Activity Tolerance: Patient limited by fatigue;No increased pain Patient left: in bed;with call bell/phone within reach;with bed alarm set Nurse Communication: Mobility status PT Visit Diagnosis: Pain;Difficulty in walking, not elsewhere classified (R26.2) Pain - Right/Left: Right Pain - part of body: Knee     Time: 2993-7169 PT Time Calculation (min) (ACUTE ONLY): 20 min  Charges:  $Therapeutic Activity: 8-22 mins                     Rica Koyanagi  PTA [facility] M-F          [phone] Weekend pager [phone]

## 2022-07-20 DIAGNOSIS — R0902 Hypoxemia: Secondary | ICD-10-CM

## 2022-07-20 DIAGNOSIS — R000 Tachycardia, unspecified: Secondary | ICD-10-CM

## 2022-07-20 DIAGNOSIS — E162 Hypoglycemia, unspecified: Secondary | ICD-10-CM | POA: Diagnosis not present

## 2022-07-20 DIAGNOSIS — M1711 Unilateral primary osteoarthritis, right knee: Secondary | ICD-10-CM | POA: Diagnosis not present

## 2022-07-20 LAB — GLUCOSE, CAPILLARY
Glucose-Capillary: 123 mg/dL — ABNORMAL HIGH (ref 70–99)
Glucose-Capillary: 97 mg/dL (ref 70–99)

## 2022-07-20 LAB — HEMOGLOBIN A1C
Hgb A1c MFr Bld: 4.7 % — ABNORMAL LOW (ref 4.8–5.6)
Mean Plasma Glucose: 88.19 mg/dL

## 2022-07-20 NOTE — Progress Notes (Signed)
   07/20/22 1500  PT Visit Information  Last PT Received On 07/20/22  Assistance Needed  Pt not feeling up to being OOB/attempting amb, continues to be nauseous. Exercise focused session which pt tol well. Continue PT POC  History of Present Illness Pt is a 71yo female presenting s/p R-TKA on 07/17/22. Rapid response 9/1 d/t remors and hypoglycemia. CTA chest negative.  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  Subjective Data  Patient Stated Goal to walk better  Precautions  Precautions Fall;Knee  Precaution Comments instructed no pillow under knee  Restrictions  RLE Weight Bearing WBAT  Pain Assessment  Pain Assessment 0-10  Pain Score 4  Pain Location right knee  Pain Descriptors / Indicators Operative site guarding;Sore;Burning;Tender  Pain Intervention(s) Limited activity within patient's tolerance;Monitored during session;Repositioned;Other (comment) (declined ice)  Cognition  Arousal/Alertness Awake/alert  Behavior During Therapy WFL for tasks assessed/performed  Overall Cognitive Status Within Functional Limits for tasks assessed  Bed Mobility  General bed mobility comments declined  Total Joint Exercises  Ankle Circles/Pumps AROM;Both;10 reps  Quad Sets AROM;Both;10 reps;Supine  Short Arc Quad AROM;Right;10 reps  Heel Slides AAROM;Both;10 reps  Hip ABduction/ADduction AROM;AAROM;Right;10 reps  Straight Leg Raises AAROM;Right;10 reps  Goniometric ROM grossly 10 to 65 degrees right knee flexion  PT - End of Session  Equipment Utilized During Treatment Gait belt  Activity Tolerance Other (comment);Patient limited by fatigue (not feeling well)  Patient left in bed;with call bell/phone within reach;with bed alarm set  Nurse Communication Mobility status   PT - Assessment/Plan  PT Plan Current plan remains appropriate  PT Visit Diagnosis Pain;Difficulty in walking, not elsewhere classified (R26.2)  Pain - Right/Left Right   Pain - part of body Knee  PT Frequency (ACUTE ONLY) 7X/week  Follow Up Recommendations Follow physician's recommendations for discharge plan and follow up therapies  Assistance recommended at discharge Intermittent Supervision/Assistance  Patient can return home with the following A little help with walking and/or transfers;A little help with bathing/dressing/bathroom;Assistance with cooking/housework;Assist for transportation;Help with stairs or ramp for entrance  PT equipment None recommended by PT  AM-PAC PT "6 Clicks" Mobility Outcome Measure (Version 2)  Help needed turning from your back to your side while in a flat bed without using bedrails? 3  Help needed moving from lying on your back to sitting on the side of a flat bed without using bedrails? 3  Help needed moving to and from a bed to a chair (including a wheelchair)? 3  Help needed standing up from a chair using your arms (e.g., wheelchair or bedside chair)? 3  Help needed to walk in hospital room? 2  Help needed climbing 3-5 steps with a railing?  1  6 Click Score 15  Consider Recommendation of Discharge To: CIR/SNF/LTACH  PT Goal Progression  Progress towards PT goals Progressing toward goals (slowly, limited by fatigue)  Acute Rehab PT Goals  PT Goal Formulation With patient  Time For Goal Achievement 07/24/22  Potential to Achieve Goals Good  PT Time Calculation  PT Start Time (ACUTE ONLY) 1450  PT Stop Time (ACUTE ONLY) 1500  PT Time Calculation (min) (ACUTE ONLY) 10 min  PT General Charges  $$ ACUTE PT VISIT 1 Visit  PT Treatments  $Therapeutic Exercise 8-22 mins

## 2022-07-20 NOTE — Progress Notes (Signed)
Pt transfer to 4th floor, room 1426. Pt alert and oriented. Report given to RN Gregary Signs. Needs addressed. Call bell placed within reach.

## 2022-07-20 NOTE — Progress Notes (Signed)
Consult Progress Note    Marie Calhoun   VWU:[id number]  DOB: [birth year]  DOA: 07/17/2022     2 PCP: Earney Mallet, MD   Hospital Course: Marie Calhoun is a 71 y.o. female with past medical history of cutaneous lupus, GERD, asthma, Charcot-Marie-Tooth. Presenting with right knee pain. Admitted to the ortho service for a total right knee. She has successfully completed that procedure.  She began having some episodes of hypoglycemia.  She has no known history of diabetes nor any prolonged steroid use. She was also found to have some hypoxia and was started on 2 L nasal cannula. CTA chest was performed on 07/19/2022 which was negative for PE.  It did reveal some bibasilar atelectasis.  She was encouraged to start working with incentive spirometer. Consult was requested for work-up regarding hypoglycemia and hypoxia.  Interval History:  Patient having some increased level of pain this morning.  She was also still noted to be tachycardic on telemetry.  She was also nauseous with emesis bag bedside.  She was given some nausea medication prior to my arrival. She is able to pull in over 1000 cc on the incentive spirometer.  I encouraged her to continue working on this.  Assessment and Plan:  Hypoglycemia -No known history of diabetes.  Will check A1c for thoroughness however -Etiology seems still somewhat unclear aside from poor appetite recently which certainly could be contributing - Medication list reviewed; she has been on Linzess for approximately 2.5 years.  There is some literature discussing association of hypoglycemia with patients greater than 94 years old on Linzess more than 2 years, therefore will hold for now; I have discussed this with patient - Continue D5NS - trend CBGs - continue encouraging diet   Acute hypoxia Hx of asthma - CTA chest shows bibasilar atelectasis -No PE or concern for underlying infection - Continue encouraging incentive spirometry use - Wean oxygen as  able -Out of bed to chair as able  Sinus tach - noted on EKG on 9/1 - multifactorial: pain, nausea, volume depletion some, anemia - continue current treatments in place as already being done - unless were to become sustained much higher, will hold off on a BB at this time   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   Old records reviewed in assessment of this patient   DVT prophylaxis:  SCDs Start: 07/17/22 1114 Place TED hose Start: 07/17/22 1114   Code Status:   Code Status: Full Code  Mobility Assessment (last 72 hours)     Mobility Assessment     Row Name 07/19/22 2118 07/19/22 1551 07/18/22 1335 07/18/22 1240 07/17/22 2100   Does patient have an order for bedrest or is patient medically unstable No - Continue assessment -- -- -- No - Continue assessment   What is the highest level of mobility based on the progressive mobility assessment? Level 5 (Walks with assist in room/hall) - Balance while stepping forward/back and can walk in room with assist - Complete Level 5 (Walks with assist in room/hall) - Balance while stepping forward/back and can walk in room with assist - Complete Level  5 (Walks with assist in room/hall) - Balance while stepping forward/back and can walk in room with assist - Complete Level 5 (Walks with assist in room/hall) - Balance while stepping forward/back and can walk in room with assist - Complete Level 5 (Walks with assist in room/hall) - Balance while stepping forward/back and can walk in room with assist - Complete    Row Name 07/17/22 1247           What is the highest level of mobility based on the progressive mobility  assessment? Level 5 (Walks with assist in room/hall) - Balance while stepping forward/back and can walk in room with assist - Complete                Objective: Blood pressure (!) 150/84, pulse (!) 123, temperature 100.2 F (37.9 C), temperature source Oral, resp. rate 18, height '5\' 8"'$  (1.727 m), weight 76.2 kg, SpO2 100 %.  Examination:  Physical Exam Constitutional:      Comments: Appears nauseous and holding emesis bag  HENT:     Head: Normocephalic and atraumatic.     Mouth/Throat:     Mouth: Mucous membranes are moist.  Eyes:     Extraocular Movements: Extraocular movements intact.  Cardiovascular:     Rate and Rhythm: Regular rhythm. Tachycardia present.  Pulmonary:     Effort: Pulmonary effort is normal.     Breath sounds: Normal breath sounds.  Abdominal:     General: Bowel sounds are normal. There is no distension.     Palpations: Abdomen is soft.     Tenderness: There is no abdominal tenderness.  Musculoskeletal:     Cervical back: Normal range of motion and neck supple.     Comments: Right knee wrapped in surgical dressing  Skin:    General: Skin is warm and dry.  Neurological:     General: No focal deficit present.     Mental Status: She is alert.  Psychiatric:        Mood and Affect: Mood normal.       Data Reviewed: Results for orders placed or performed during the hospital encounter of 07/17/22 (from the past 24 hour(s))  Glucose, capillary     Status: None   Collection Time: 07/19/22  1:44 PM  Result Value Ref Range   Glucose-Capillary 88 70 - 99 mg/dL  Glucose, capillary     Status: Abnormal   Collection Time: 07/19/22  3:28 PM  Result Value Ref Range   Glucose-Capillary 135 (H) 70 - 99 mg/dL  Comprehensive metabolic panel     Status: Abnormal   Collection Time: 07/19/22  3:58 PM  Result Value Ref Range   Sodium 134 (L) 135 - 145 mmol/L   Potassium 3.5 3.5 - 5.1 mmol/L   Chloride 105 98 - 111 mmol/L   CO2 23 22 - 32 mmol/L   Glucose, Bld  138 (H) 70 - 99 mg/dL   BUN 13 8 - 23 mg/dL   Creatinine, Ser 0.83 0.44 - 1.00 mg/dL   Calcium 8.0 (L) 8.9 - 10.3 mg/dL   Total Protein 5.5 (L) 6.5 - 8.1 g/dL   Albumin 2.5 (L) 3.5 - 5.0 g/dL   AST 17 15 - 41 U/L   ALT 8 0 - 44 U/L   Alkaline Phosphatase 78 38 - 126 U/L   Total Bilirubin 0.6 0.3 - 1.2 mg/dL   GFR, Estimated >60 >60 mL/min   Anion gap 6 5 - 15  CBC with Differential/Platelet  Status: Abnormal   Collection Time: 07/19/22  3:58 PM  Result Value Ref Range   WBC 8.6 4.0 - 10.5 K/uL   RBC 2.89 (L) 3.87 - 5.11 MIL/uL   Hemoglobin 9.2 (L) 12.0 - 15.0 g/dL   HCT 28.0 (L) 36.0 - 46.0 %   MCV 96.9 80.0 - 100.0 fL   MCH 31.8 26.0 - 34.0 pg   MCHC 32.9 30.0 - 36.0 g/dL   RDW 12.4 11.5 - 15.5 %   Platelets 184 150 - 400 K/uL   nRBC 0.0 0.0 - 0.2 %   Neutrophils Relative % 79 %   Neutro Abs 6.9 1.7 - 7.7 K/uL   Lymphocytes Relative 9 %   Lymphs Abs 0.8 0.7 - 4.0 K/uL   Monocytes Relative 11 %   Monocytes Absolute 0.9 0.1 - 1.0 K/uL   Eosinophils Relative 0 %   Eosinophils Absolute 0.0 0.0 - 0.5 K/uL   Basophils Relative 0 %   Basophils Absolute 0.0 0.0 - 0.1 K/uL   Immature Granulocytes 1 %   Abs Immature Granulocytes 0.04 0.00 - 0.07 K/uL  Glucose, capillary     Status: Abnormal   Collection Time: 07/19/22  5:33 PM  Result Value Ref Range   Glucose-Capillary 107 (H) 70 - 99 mg/dL  Urinalysis, Routine w reflex microscopic Urine, Clean Catch     Status: None   Collection Time: 07/19/22  6:26 PM  Result Value Ref Range   Color, Urine YELLOW YELLOW   APPearance CLEAR CLEAR   Specific Gravity, Urine 1.026 1.005 - 1.030   pH 5.0 5.0 - 8.0   Glucose, UA NEGATIVE NEGATIVE mg/dL   Hgb urine dipstick NEGATIVE NEGATIVE   Bilirubin Urine NEGATIVE NEGATIVE   Ketones, ur NEGATIVE NEGATIVE mg/dL   Protein, ur NEGATIVE NEGATIVE mg/dL   Nitrite NEGATIVE NEGATIVE   Leukocytes,Ua NEGATIVE NEGATIVE   RBC / HPF 0-5 0 - 5 RBC/hpf   WBC, UA 0-5 0 - 5 WBC/hpf   Bacteria, UA  NONE SEEN NONE SEEN   Squamous Epithelial / LPF 0-5 0 - 5  Glucose, capillary     Status: None   Collection Time: 07/19/22 10:22 PM  Result Value Ref Range   Glucose-Capillary 88 70 - 99 mg/dL  Glucose, capillary     Status: Abnormal   Collection Time: 07/20/22  1:35 AM  Result Value Ref Range   Glucose-Capillary 123 (H) 70 - 99 mg/dL    I have Reviewed nursing notes, Vitals, and Lab results since pt's last encounter. Pertinent lab results : see above I have ordered test including BMP, CBC, Mg I have reviewed the last note from staff over past 24 hours I have discussed pt's care plan and test results with nursing staff, case manager   LOS: 2 days   Dwyane Dee, MD [facility] 07/20/2022, 12:01 PM

## 2022-07-20 NOTE — Plan of Care (Signed)

## 2022-07-20 NOTE — Progress Notes (Addendum)
Physical Therapy Treatment Patient Details Name: Marie Calhoun MRN: [id number] DOB: Sep 10, 1951 Today's Date: 07/20/2022   History of Present Illness Pt is a 71yo female presenting s/p R-TKA on 07/17/22. Rapid response 9/1 d/t remors and hypoglycemia. CTA chest negative.  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    PT Comments    Progress limited d/t pt with n/v despite having had meds, pt reports feeling "awful". NT checked VS and pt with 100.2 temp. HR 118 at rest-135 max with EOB/transfers, remains tachy, SpO2=100% on 1L. Will continue efforts to mobilize.   Recommendations for follow up therapy are one component of a multi-disciplinary discharge planning process, led by the attending physician.  Recommendations may be updated based on patient status, additional functional criteria and insurance authorization.  Follow Up Recommendations  Follow physician's recommendations for discharge plan and follow up therapies     Assistance Recommended at Discharge Intermittent Supervision/Assistance  Patient can return home with the following A little help with walking and/or transfers;A little help with bathing/dressing/bathroom;Assistance with cooking/housework;Assist for transportation;Help with stairs or ramp for entrance   Equipment Recommendations  None recommended by PT    Recommendations for Other Services       Precautions / Restrictions Precautions Precautions: Fall Precaution Comments: instructed no pillow under knee Restrictions Weight Bearing Restrictions: No RLE Weight Bearing: Weight bearing as tolerated     Mobility  Bed Mobility Overal bed mobility: Needs Assistance Bed Mobility: Supine to Sit, Sit to Supine     Supine to sit: Min assist, Mod assist Sit to supine: Min assist   General bed mobility comments: assist to elevate trunk and bring RLE off bed.  incr time. light assist to elevate RLE on to bed    Transfers    Equipment used: Rolling walker (2 wheels) Transfers: Sit to/from Stand Sit to Stand: Min guard, Min assist           General transfer comment: assist to rise and transition to RW, min/guard once in standing; limited to lateral steps along EOB d/t nausea    Ambulation/Gait                   Stairs             Wheelchair Mobility    Modified Rankin (Stroke Patients Only)       Balance     Sitting balance-Leahy Scale: Good     Standing balance support: Reliant on assistive device for balance, During functional activity, Bilateral upper extremity supported Standing balance-Leahy Scale: Poor                              Cognition Arousal/Alertness: Awake/alert Behavior During Therapy: WFL for tasks assessed/performed Overall Cognitive Status: Within Functional Limits for tasks assessed                                          Exercises Total Joint Exercises Ankle Circles/Pumps: AROM, Both, 10 reps    General Comments        Pertinent Vitals/Pain Pain Assessment Pain Assessment: 0-10 Pain Score: 4  Pain Location: right knee Pain Descriptors / Indicators: Operative site guarding, Sore, Burning, Tender Pain Intervention(s): Limited activity within patient's tolerance, Monitored during session, Repositioned, Ice applied    Home Living  Prior Function            PT Goals (current goals can now be found in the care plan section) Acute Rehab PT Goals Patient Stated Goal: to walk better PT Goal Formulation: With patient Time For Goal Achievement: 07/24/22 Potential to Achieve Goals: Good Progress towards PT goals: Progressing toward goals (slowly)    Frequency    7X/week      PT Plan Current plan remains appropriate    Co-evaluation              AM-PAC PT "6 Clicks" Mobility   Outcome Measure  Help needed turning from your back to your side while in a flat bed  without using bedrails?: A Little Help needed moving from lying on your back to sitting on the side of a flat bed without using bedrails?: A Little Help needed moving to and from a bed to a chair (including a wheelchair)?: A Little Help needed standing up from a chair using your arms (e.g., wheelchair or bedside chair)?: A Little Help needed to walk in hospital room?: A Lot Help needed climbing 3-5 steps with a railing? : Total 6 Click Score: 15    End of Session Equipment Utilized During Treatment: Gait belt Activity Tolerance: Patient limited by fatigue Patient left: in bed;with call bell/phone within reach;with bed alarm set Nurse Communication: Mobility status PT Visit Diagnosis: Pain;Difficulty in walking, not elsewhere classified (R26.2) Pain - Right/Left: Right Pain - part of body: Knee     Time: 4163-8453 PT Time Calculation (min) (ACUTE ONLY): 17 min  Charges:  $Therapeutic Activity: 8-22 mins                     Baxter Flattery, PT  Acute Rehab Dept [facility]) [phone]  [facility] Weekend Pager (Saturday/Sunday only)  [phone]  07/20/2022    [facility] 07/20/2022, 2:36 PM

## 2022-07-20 NOTE — Progress Notes (Signed)
The patient had an emesis occurrence at 6:48.

## 2022-07-20 NOTE — Progress Notes (Signed)
Marie Calhoun  MRN: [id number] DOB/Age: [birth year] 71 y.o. [name] Procedure: Procedure(s) (LRB): COMPUTER ASSISTED TOTAL KNEE ARTHROPLASTY (Right)     Subjective: Marie Calhoun unfortunately is having significant problems with nausea and vomiting.  Medicine was also consulted for episodes of hypoxia and hypoglycemia.  She had an episode of emesis earlier this morning and still feels nauseated however has just been medicated with a different antiemetic.  Vital Signs Temp:  [97.7 F (36.5 C)-100.9 F (38.3 C)] 97.7 F (36.5 C) (09/02 0544) Pulse Rate:  [76-140] 76 (09/02 0544) Resp:  [16-20] 16 (09/02 0544) BP: (108-143)/(67-84) 135/84 (09/02 0544) SpO2:  [91 %-100 %] 91 % (09/02 0544)  Lab Results Recent Labs    07/19/22 0412 07/19/22 1558  WBC 8.0 8.6  HGB 9.9* 9.2*  HCT 30.9* 28.0*  PLT 187 184   BMET Recent Labs    07/19/22 0412 07/19/22 1558  NA 135 134*  K 3.8 3.5  CL 106 105  CO2 25 23  GLUCOSE 110* 138*  BUN 15 13  CREATININE 0.91 0.83  CALCIUM 8.0* 8.0*   No results found for: "INR"   Exam I removed her Ace wrap and additional cast padding allowing her right lower extremity is to have some air.  Her Aquacel dressing is in place with no particular drainage noted.  Neurovascular she is intact.        Plan Continue with antiemetics and push the fluids in hopes to have her be able to participate with therapy later today.  She had her hip replaced earlier this year and did not have these particular difficulties with nausea and vomiting postoperatively.  We will continue to follow and as her nausea and vomiting improves hopefully we can resume her therapies and work on getting her discharged home.  Marie Calhoun Marie Karg PA-C  07/20/2022, 9:58 AM Contact # [phone]

## 2022-07-21 ENCOUNTER — Inpatient Hospital Stay (HOSPITAL_COMMUNITY): Payer: Medicare Other

## 2022-07-21 DIAGNOSIS — R0902 Hypoxemia: Secondary | ICD-10-CM | POA: Diagnosis not present

## 2022-07-21 DIAGNOSIS — M1711 Unilateral primary osteoarthritis, right knee: Secondary | ICD-10-CM | POA: Diagnosis not present

## 2022-07-21 DIAGNOSIS — E876 Hypokalemia: Secondary | ICD-10-CM

## 2022-07-21 DIAGNOSIS — E162 Hypoglycemia, unspecified: Secondary | ICD-10-CM | POA: Diagnosis not present

## 2022-07-21 LAB — BASIC METABOLIC PANEL
Anion gap: 6 (ref 5–15)
BUN: 15 mg/dL (ref 8–23)
CO2: 22 mmol/L (ref 22–32)
Calcium: 7.9 mg/dL — ABNORMAL LOW (ref 8.9–10.3)
Chloride: 109 mmol/L (ref 98–111)
Creatinine, Ser: 0.8 mg/dL (ref 0.44–1.00)
GFR, Estimated: >60 mL/min (ref >60)
Glucose, Bld: 121 mg/dL — ABNORMAL HIGH (ref 70–99)
Potassium: 3.9 mmol/L (ref 3.5–5.1)
Sodium: 137 mmol/L (ref 135–145)

## 2022-07-21 LAB — MAGNESIUM: Magnesium: 1.7 mg/dL (ref 1.7–2.4)

## 2022-07-21 LAB — GLUCOSE, CAPILLARY
Glucose-Capillary: 115 mg/dL — ABNORMAL HIGH (ref 70–99)
Glucose-Capillary: 141 mg/dL — ABNORMAL HIGH (ref 70–99)

## 2022-07-21 LAB — TSH: TSH: 2.069 u[IU]/mL (ref 0.350–4.500)

## 2022-07-21 MED ORDER — POTASSIUM CHLORIDE 20 MEQ PO PACK
40.0000 meq | PACK | Freq: Once | ORAL | Status: AC
  Administered 2022-07-21: 40 meq via ORAL
  Filled 2022-07-21: qty 2

## 2022-07-21 MED ORDER — SCOPOLAMINE 1 MG/3DAYS TD PT72
1.0000 | MEDICATED_PATCH | TRANSDERMAL | Status: DC
  Administered 2022-07-21 – 2022-07-24 (×2): 1.5 mg via TRANSDERMAL
  Filled 2022-07-21 (×2): qty 1

## 2022-07-21 MED ORDER — MAGNESIUM SULFATE 2 GM/50ML IV SOLN
2.0000 g | Freq: Once | INTRAVENOUS | Status: AC
  Administered 2022-07-21: 2 g via INTRAVENOUS
  Filled 2022-07-21: qty 50

## 2022-07-21 NOTE — Progress Notes (Signed)
Consult Progress Note    Marie Calhoun   NTZ:[id number]  DOB: [birth year]  DOA: 07/17/2022     3 PCP: Earney Mallet, MD   Hospital Course: Marie Calhoun is a 71 y.o. female with past medical history of cutaneous lupus, GERD, asthma, Charcot-Marie-Tooth. Presenting with right knee pain. Admitted to the ortho service for a total right knee. She has successfully completed that procedure.  She began having some episodes of hypoglycemia.  She has no known history of diabetes nor any prolonged steroid use. She was also found to have some hypoxia and was started on 2 L nasal cannula. CTA chest was performed on 07/19/2022 which was negative for PE.  It did reveal some bibasilar atelectasis.  She was encouraged to start working with incentive spirometer. Consult was requested for work-up regarding hypoglycemia and hypoxia.  Interval History:  Noted burst of NSVT overnight. Still nauseous this morning but mildly better though not eating much at all still. She said eggs made her feel more nauseous this morning.  Pain seems controlled for the most part. She's now on RA at least.  CBGs are stable but still on IVF.   Abd xray this am showed widespread distension. I agree this could be mild ileus; her diet was changed to CLD.   Assessment and Plan:  Hypoglycemia -No known history of diabetes.  A1c 4.7%.  - etiology still appears just possibly poor intake but will continue to hold Linzess for now still - continue D5NS and trending CBGs further until intake improves   Nausea/vomiting -Abdominal x-ray obtained this morning showing mild gaseous distention of the colon.  Agree this appears to look like mild ileus.  She is nontender and no obvious obstruction appreciated on x-ray imaging - Diet has been changed to clear liquids per primary - Continue antiemetics as needed -If she does develop persistent vomiting, could consider NG tube at that time, but okay to continue holding off for now as  well  Acute hypoxia - resolved  Hx of asthma - CTA chest shows bibasilar atelectasis -No PE or concern for underlying infection - Continue encouraging incentive spirometry use - O2 has been weaned off; remains on RA -Out of bed to chair as able  Sinus tach NSVT - noted on EKG on 9/1 - multifactorial: pain, nausea, volume depletion some, anemia - continue current treatments in place as already being done - unless were to become sustained much higher, will hold off on a BB at this time - monitor K and Mg esp with ileus; will target K>4 and Mg>2   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   Old records reviewed in assessment of this patient   DVT prophylaxis:  SCDs Start: 07/17/22 1114 Place TED hose Start: 07/17/22 1114   Code Status:   Code Status: Full Code  Mobility Assessment (last 72 hours)     Mobility Assessment     Row Name 07/21/22 1200 07/20/22 2000 07/20/22 1843 07/20/22 1400 07/19/22 2118   Does patient have an order for bedrest or is patient medically unstable -- No - Continue assessment No - Continue assessment --  No - Continue assessment   What is the highest level of mobility based on the progressive mobility assessment? Level 5 (Walks with assist in room/hall) - Balance while stepping forward/back and can walk in room with assist - Complete Level 3 (Stands with assist) - Balance while standing  and cannot march in place Level 3 (Stands with assist) - Balance while standing  and cannot march in place Level 3 (Stands with assist) - Balance while standing  and cannot march in place Level 5 (Walks with assist in room/hall) - Balance while stepping  forward/back and can walk in room with assist - Complete    Row Name 07/19/22 1551           What is the highest level of mobility based on the progressive mobility assessment? Level 5 (Walks with assist in room/hall) - Balance while stepping forward/back and can walk in room with assist - Complete                Objective: Blood pressure 135/82, pulse (!) 101, temperature (!) 97.4 F (36.3 C), temperature source Oral, resp. rate 18, height '5\' 8"'$  (1.727 m), weight 76.2 kg, SpO2 97 %.  Examination:  Physical Exam Constitutional:      Comments: Fatigued appearing  HENT:     Head: Normocephalic and atraumatic.     Mouth/Throat:     Mouth: Mucous membranes are moist.  Eyes:     Extraocular Movements: Extraocular movements intact.  Cardiovascular:     Rate and Rhythm: Regular rhythm. Tachycardia present.  Pulmonary:     Effort: Pulmonary effort is normal.     Breath sounds: Normal breath sounds.  Abdominal:     General: Bowel sounds are normal. There is no distension.     Palpations: Abdomen is soft.     Tenderness: There is no abdominal tenderness.  Musculoskeletal:     Cervical back: Normal range of motion and neck supple.     Comments: Right knee wrapped in surgical dressing  Skin:    General: Skin is warm and dry.  Neurological:     General: No focal deficit present.     Mental Status: She is alert.  Psychiatric:        Mood and Affect: Mood normal.       Data Reviewed: Results for orders placed or performed during the hospital encounter of 07/17/22 (from the past 24 hour(s))  Glucose, capillary     Status: None   Collection Time: 07/20/22  3:57 PM  Result Value Ref Range   Glucose-Capillary 97 70 - 99 mg/dL  Glucose, capillary     Status: Abnormal   Collection Time: 07/21/22 12:14 AM  Result Value Ref Range   Glucose-Capillary 141 (H) 70 - 99 mg/dL  Basic metabolic panel     Status: Abnormal   Collection Time: 07/21/22  3:13 AM  Result Value Ref Range    Sodium 137 135 - 145 mmol/L   Potassium 3.9 3.5 - 5.1 mmol/L   Chloride 109 98 - 111 mmol/L   CO2 22 22 - 32 mmol/L   Glucose, Bld 121 (H) 70 - 99 mg/dL   BUN 15 8 - 23 mg/dL   Creatinine, Ser 0.80 0.44 - 1.00 mg/dL   Calcium 7.9 (L) 8.9 - 10.3 mg/dL   GFR, Estimated >60 >60 mL/min   Anion gap 6 5 - 15  Magnesium     Status: None   Collection Time: 07/21/22  3:13 AM  Result Value  Ref Range   Magnesium 1.7 1.7 - 2.4 mg/dL  TSH     Status: None   Collection Time: 07/21/22  3:13 AM  Result Value Ref Range   TSH 2.069 0.350 - 4.500 uIU/mL    I have Reviewed nursing notes, Vitals, and Lab results since pt's last encounter. Pertinent lab results : see above I have ordered test including BMP, CBC, Mg I have reviewed the last note from staff over past 24 hours I have discussed pt's care plan and test results with nursing staff, case manager   LOS: 3 days   Dwyane Dee, MD [facility] 07/21/2022, 2:11 PM

## 2022-07-21 NOTE — Progress Notes (Signed)
Patient flagged for YELLOW MEWS of 2    07/20/22 2218  Assess: MEWS Score  Temp 99.8 F (37.7 C)  BP 122/77  MAP (mmHg) 90  Pulse Rate (!) 114  ECG Heart Rate (!) 114  Resp 20  Level of Consciousness Alert  SpO2 96 %  O2 Device Nasal Cannula  O2 Flow Rate (L/min) 2 L/min  Assess: MEWS Score  MEWS Temp 0  MEWS Systolic 0  MEWS Pulse 2  MEWS RR 0  MEWS LOC 0  MEWS Score 2  MEWS Score Color Yellow  Assess: if the MEWS score is Yellow or Red  Were vital signs taken at a resting state? Yes  Focused Assessment No change from prior assessment  Does the patient meet 2 or more of the SIRS criteria? No  Does the patient have a confirmed or suspected source of infection? No  MEWS guidelines implemented *See Row Information* Yes  Treat  MEWS Interventions Administered prn meds/treatments  Pain Scale 0-10  Pain Score 0  Multiple Pain Sites No  Complains of Nausea /  Vomiting  Nausea relieved by Antiemetic;Other (Comment) (cold application/washcloth)  Patients response to intervention Decreased  Take Vital Signs  Increase Vital Sign Frequency  Yellow: Q 2hr X 2 then Q 4hr X 2, if remains yellow, continue Q 4hrs  Escalate  MEWS: Escalate Yellow: discuss with charge nurse/RN and consider discussing with provider and RRT  Notify: Charge Nurse/RN  Name of Charge Nurse/RN Notified Si Gaul, RN  Date Charge Nurse/RN Notified 07/20/22  Time Charge Nurse/RN Notified 2358  Notify: Provider  Provider Name/Title Dr. Hal Hope  Date Provider Notified 07/21/22  Time Provider Notified [phone]  Method of Notification  (secure chat)  Notification Reason Other (Comment) (rhythm notification/request for AM labs)  Provider response See new orders  Date of Provider Response 07/21/22  Time of Provider Response 0240  Document  Patient Outcome Stabilized after interventions  Progress note created (see row info) Yes  Assess: SIRS CRITERIA  SIRS Temperature  0  SIRS Pulse 1  SIRS  Respirations  0  SIRS WBC 1  SIRS Score Sum  2

## 2022-07-21 NOTE — Plan of Care (Signed)
  Problem: Education: Goal: Knowledge of the prescribed therapeutic regimen will improve Outcome: Progressing   Problem: Activity: Goal: Ability to avoid complications of mobility impairment will improve Outcome: Progressing   Problem: Clinical Measurements: Goal: Diagnostic test results will improve Outcome: Progressing   Problem: Pain Managment: Goal: General experience of comfort will improve Outcome: Progressing   Problem: Safety: Goal: Ability to remain free from injury will improve Outcome: Progressing

## 2022-07-21 NOTE — Progress Notes (Signed)
Physical Therapy Treatment Patient Details Name: Marie Calhoun MRN: [id number] DOB: 24-Nov-1950 Today's Date: 07/21/2022   History of Present Illness Pt is a 71yo female presenting s/p R-TKA on 07/17/22. Rapid response 9/1 d/t remors and hypoglycemia. CTA chest negative. Transferred to tele d/t tachycardia, had 19 beat run of SVT early morning of 9/3. 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    PT Comments    Marie Calhoun is improving today with PT, able to amb~ 40' with min assist and mild fatigue, no c/o nausea.  Resting HR 96, with EOB 117, max 134 during amb.  Continue PT POC  Recommendations for follow up therapy are one component of a multi-disciplinary discharge planning process, led by the attending physician.  Recommendations may be updated based on patient status, additional functional criteria and insurance authorization.  Follow Up Recommendations  Follow physician's recommendations for discharge plan and follow up therapies     Assistance Recommended at Discharge Intermittent Supervision/Assistance  Patient can return home with the following A little help with walking and/or transfers;A little help with bathing/dressing/bathroom;Assistance with cooking/housework;Assist for transportation;Help with stairs or ramp for entrance   Equipment Recommendations  None recommended by PT    Recommendations for Other Services       Precautions / Restrictions Precautions Precautions: Fall;Knee Restrictions Weight Bearing Restrictions: No RLE Weight Bearing: Weight bearing as tolerated     Mobility  Bed Mobility Overal bed mobility: Needs Assistance Bed Mobility: Supine to Sit     Supine to sit: Min guard     General bed mobility comments: able to transition to EOB with min/guard assist, incr time, use of gait belt to self assist    Transfers Overall transfer level: Needs assistance Equipment used: Rolling walker (2  wheels) Transfers: Sit to/from Stand Sit to Stand: Min guard, Min assist           General transfer comment: assist to rise and transition to RW, min/guard once in standing    Ambulation/Gait Ambulation/Gait assistance: Supervision, Min guard Gait Distance (Feet): 40 Feet Assistive device: Rolling walker (2 wheels) Gait Pattern/deviations: Step-to pattern, Step-through pattern, Decreased stance time - right       General Gait Details: cues for RW position, incr distance with encouragement   Stairs             Wheelchair Mobility    Modified Rankin (Stroke Patients Only)       Balance Overall balance assessment: Needs assistance         Standing balance support: Reliant on assistive device for balance, During functional activity, Bilateral upper extremity supported Standing balance-Leahy Scale: Fair Standing balance comment: able to static stand without UEs                            Cognition Arousal/Alertness: Awake/alert Behavior During Therapy: WFL for tasks assessed/performed Overall Cognitive Status: Within Functional Limits for tasks assessed                                          Exercises      General Comments        Pertinent Vitals/Pain Pain Assessment Pain Assessment: 0-10 Pain Score: 3  Pain Location: right knee Pain Descriptors / Indicators: Operative site guarding, Sore, Burning, Tender Pain Intervention(s): Limited activity within patient's tolerance, Monitored during session,  Premedicated before session, Repositioned    Home Living                          Prior Function            PT Goals (current goals can now be found in the care plan section) Acute Rehab PT Goals Patient Stated Goal: to walk better PT Goal Formulation: With patient Time For Goal Achievement: 07/24/22 Potential to Achieve Goals: Good Progress towards PT goals: Progressing toward goals    Frequency     7X/week      PT Plan Current plan remains appropriate    Co-evaluation              AM-PAC PT "6 Clicks" Mobility   Outcome Measure  Help needed turning from your back to your side while in a flat bed without using bedrails?: A Little Help needed moving from lying on your back to sitting on the side of a flat bed without using bedrails?: A Little Help needed moving to and from a bed to a chair (including a wheelchair)?: A Little Help needed standing up from a chair using your arms (e.g., wheelchair or bedside chair)?: A Little Help needed to walk in hospital room?: A Little Help needed climbing 3-5 steps with a railing? : A Little 6 Click Score: 18    End of Session Equipment Utilized During Treatment: Gait belt Activity Tolerance: Patient tolerated treatment well Patient left: in chair;with call bell/phone within reach;with chair alarm set Nurse Communication: Mobility status PT Visit Diagnosis: Pain;Difficulty in walking, not elsewhere classified (R26.2) Pain - Right/Left: Right Pain - part of body: Knee     Time: 8527-7824 PT Time Calculation (min) (ACUTE ONLY): 26 min  Charges:  $Gait Training: 23-37 mins                     Baxter Flattery, PT  Acute Rehab Dept [facility]) [phone]  [facility] Weekend Pager (Saturday/Sunday only)  [phone]  07/21/2022    [facility] 07/21/2022, 12:48 PM

## 2022-07-21 NOTE — Progress Notes (Addendum)
    Subjective:  Patient reports knee pain as mild to moderate.  She has persistent nausea. Emesis when she eats. (+) BM.  Objective:   VITALS:   Vitals:   07/20/22 1819 07/20/22 2218 07/21/22 0017 07/21/22 0546  BP: 129/84 122/77 116/72 131/71  Pulse: (!) 116 (!) 114 (!) 103 96  Resp: '16 20 20 20  '$ Temp: 99.9 F (37.7 C) 99.8 F (37.7 C) 99.5 F (37.5 C) 98.7 F (37.1 C)  TempSrc: Oral Oral Oral Oral  SpO2: 99% 96% 96% 98%  Weight:      Height:        Sitting up in bed. NAD Neurologically intact ABD soft Neurovascular intact Sensation intact distally Intact pulses distally Dorsiflexion/Plantar flexion intact Incision: dressing C/D/I No cellulitis present Compartment soft   Lab Results  Component Value Date   WBC 8.6 07/19/2022   HGB 9.2 (L) 07/19/2022   HCT 28.0 (L) 07/19/2022   MCV 96.9 07/19/2022   PLT 184 07/19/2022   BMET    Component Value Date/Time   NA 137 07/21/2022 0313   K 3.9 07/21/2022 0313   CL 109 07/21/2022 0313   CO2 22 07/21/2022 0313   GLUCOSE 121 (H) 07/21/2022 0313   BUN 15 07/21/2022 0313   CREATININE 0.80 07/21/2022 0313   CALCIUM 7.9 (L) 07/21/2022 0313   GFRNONAA >60 07/21/2022 0313     Assessment/Plan: 4 Days Post-Op   Principal Problem:   Osteoarthritis of right knee Active Problems:   Charcot-Marie-Tooth disease of neuronal type   Asthma   GERD (gastroesophageal reflux disease)   Cutaneous lupus erythematosus   Hepatic lesion   Hypoxia   Hypoglycemia   Hypokalemia   WBAT with walker DVT ppx: Aspirin, SCDs, TEDS PO pain control: minimize narcotics, hasn't received since 9/1 PT/OT N/V: ? Ileus, will check KUB, cont Zofran and Reglan prn, will try scopolamine patch, possible surgery consult depending on findings Dispo: keep in house for N/V   Bertram Savin, MD 07/21/2022, 9:13 AM    ADDENDUM: KUB shows mild colonic distension. Will change diet to clear liquids for today. If she starts passing more gas  and feeling better tomorrow, diet can be advanced. Discussed patient with Dr. Marlou Starks. No need for NGT now.  Bertram Savin, MD 10:24 AM    [facility]  [place] [address], [address], [place], [place] [address]

## 2022-07-21 NOTE — Progress Notes (Signed)
Central telemetry notified this RN that patient had a 19 beat run of SVT. Patient is asymptomatic at this time when assessed. Labs ordered by the night on call provider have been collected and are in process. Will review those when they come back.

## 2022-07-22 DIAGNOSIS — M1711 Unilateral primary osteoarthritis, right knee: Secondary | ICD-10-CM | POA: Diagnosis not present

## 2022-07-22 DIAGNOSIS — R0902 Hypoxemia: Secondary | ICD-10-CM | POA: Diagnosis not present

## 2022-07-22 DIAGNOSIS — E162 Hypoglycemia, unspecified: Secondary | ICD-10-CM | POA: Diagnosis not present

## 2022-07-22 LAB — BASIC METABOLIC PANEL
Anion gap: 6 (ref 5–15)
BUN: 13 mg/dL (ref 8–23)
CO2: 21 mmol/L — ABNORMAL LOW (ref 22–32)
Calcium: 7.8 mg/dL — ABNORMAL LOW (ref 8.9–10.3)
Chloride: 112 mmol/L — ABNORMAL HIGH (ref 98–111)
Creatinine, Ser: 0.78 mg/dL (ref 0.44–1.00)
GFR, Estimated: >60 mL/min (ref >60)
Glucose, Bld: 104 mg/dL — ABNORMAL HIGH (ref 70–99)
Potassium: 3.2 mmol/L — ABNORMAL LOW (ref 3.5–5.1)
Sodium: 139 mmol/L (ref 135–145)

## 2022-07-22 LAB — CBC WITH DIFFERENTIAL/PLATELET
Abs Immature Granulocytes: 0.03 10*3/uL (ref 0.00–0.07)
Basophils Absolute: 0 10*3/uL (ref 0.0–0.1)
Basophils Relative: 1 %
Eosinophils Absolute: 0.1 10*3/uL (ref 0.0–0.5)
Eosinophils Relative: 2 %
HCT: 26.8 % — ABNORMAL LOW (ref 36.0–46.0)
Hemoglobin: 8.4 g/dL — ABNORMAL LOW (ref 12.0–15.0)
Immature Granulocytes: 1 %
Lymphocytes Relative: 15 %
Lymphs Abs: 0.9 10*3/uL (ref 0.7–4.0)
MCH: 31.5 pg (ref 26.0–34.0)
MCHC: 31.3 g/dL (ref 30.0–36.0)
MCV: 100.4 fL — ABNORMAL HIGH (ref 80.0–100.0)
Monocytes Absolute: 0.7 10*3/uL (ref 0.1–1.0)
Monocytes Relative: 11 %
Neutro Abs: 4.4 10*3/uL (ref 1.7–7.7)
Neutrophils Relative %: 70 %
Platelets: 210 10*3/uL (ref 150–400)
RBC: 2.67 MIL/uL — ABNORMAL LOW (ref 3.87–5.11)
RDW: 12.5 % (ref 11.5–15.5)
WBC: 6.1 10*3/uL (ref 4.0–10.5)
nRBC: 0 % (ref 0.0–0.2)

## 2022-07-22 LAB — MAGNESIUM: Magnesium: 2.3 mg/dL (ref 1.7–2.4)

## 2022-07-22 MED ORDER — POTASSIUM CHLORIDE 20 MEQ PO PACK
40.0000 meq | PACK | ORAL | Status: DC
  Filled 2022-07-22: qty 2

## 2022-07-22 MED ORDER — LANSOPRAZOLE 15 MG PO CPDR
30.0000 mg | DELAYED_RELEASE_CAPSULE | Freq: Every day | ORAL | Status: DC
  Administered 2022-07-23 – 2022-07-24 (×2): 30 mg via ORAL
  Filled 2022-07-22 (×3): qty 2

## 2022-07-22 MED ORDER — POTASSIUM CHLORIDE CRYS ER 20 MEQ PO TBCR
40.0000 meq | EXTENDED_RELEASE_TABLET | ORAL | Status: AC
  Administered 2022-07-22 (×2): 40 meq via ORAL
  Filled 2022-07-22 (×2): qty 2

## 2022-07-22 NOTE — Progress Notes (Signed)
   Subjective: 5 Days Post-Op Procedure(s) (LRB): COMPUTER ASSISTED TOTAL KNEE ARTHROPLASTY (Right)  Pt states she is doing well with her knee and therapy thus far Had hyperactive bowel sounds last night  Appreciate medical team following along  Denies any abdominal pain and nausea has improved Otherwise doing fairly well Patient reports pain as mild.  Objective:   VITALS:   Vitals:   07/21/22 2111 07/22/22 0540  BP: 132/74 132/81  Pulse: 91 88  Resp: (!) 21 20  Temp: 98.7 F (37.1 C) 99 F (37.2 C)  SpO2: 97% 96%    Right knee incision healing well Nv intact distally No rashes or edema Abd: non tender and not distended   LABS Recent Labs    07/19/22 1558 07/22/22 0314  HGB 9.2* 8.4*  HCT 28.0* 26.8*  WBC 8.6 6.1  PLT 184 210    Recent Labs    07/19/22 1558 07/21/22 0313 07/22/22 0314  NA 134* 137 139  K 3.5 3.9 3.2*  BUN '13 15 13  '$ CREATININE 0.83 0.80 0.78  GLUCOSE 138* 121* 104*     Assessment/Plan: 5 Days Post-Op Procedure(s) (LRB): COMPUTER ASSISTED TOTAL KNEE ARTHROPLASTY (Right) Continue PT/OT Will await improvement with abdominal ileus before d/c Pain management as needed    Kathrynn Speed, [place] [facility] now [employer] [facility] [address] [place], [place], [place] [address] Phone: [phone] [email or web address] Facebook  [employer]

## 2022-07-22 NOTE — Progress Notes (Signed)
Consult Progress Note    Marie Calhoun   OBS:[id number]  DOB: 1951/08/05  DOA: 07/17/2022     4 PCP: Earney Mallet, MD   Hospital Course: Marie Calhoun is a 71 y.o. female with past medical history of cutaneous lupus, GERD, asthma, Charcot-Marie-Tooth. Presenting with right knee pain. Admitted to the ortho service for a total right knee. She has successfully completed that procedure.  She began having some episodes of hypoglycemia.  She has no known history of diabetes nor any prolonged steroid use. She was also found to have some hypoxia and was started on 2 L nasal cannula. CTA chest was performed on 07/19/2022 which was negative for PE.  It did reveal some bibasilar atelectasis.  She was encouraged to start working with incentive spirometer. Consult was requested for work-up regarding hypoglycemia and hypoxia.  Interval History:  Appears more comfortable compared to yesterday with improvement in energy as well.  States that she has been tolerating clear liquids and ate about 90% of it for dinner last night.  Nausea seems much better today and she has had no vomiting.  Assessment and Plan:  Hypoglycemia -No known history of diabetes.  A1c 4.7%.  - etiology still appears just possibly poor intake but will continue to hold Linzess for now still -Hold fluids for today and continue trending CBGs  Nausea/vomiting -Abdominal x-ray obtained 9/3 showing mild gaseous distention of the colon.  Agree this appears to look like mild ileus.  She is nontender and no obvious obstruction appreciated on x-ray imaging - Diet has been changed to clear liquids per primary; would be okay to advance as tolerated - Continue antiemetics as needed - looks to be able to avoid NGT given improvement; intake of CLD has been good  Acute hypoxia - resolved  Hx of asthma - CTA chest shows bibasilar atelectasis -No PE or concern for underlying infection - Continue encouraging incentive spirometry use - O2 has  been weaned off; remains on RA -Out of bed to chair as able  Sinus tachycardia - resolved  NSVT - noted on EKG on 9/1 - multifactorial: pain, nausea, volume depletion some, anemia - continue current treatments in place as already being done - unless were to become sustained much higher, will hold off on a BB at this time - monitor K and Mg esp with ileus; will target K>4 and Mg>2   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   Old records reviewed in assessment of this patient   DVT prophylaxis:  SCDs Start: 07/17/22 1114 Place TED hose Start: 07/17/22 1114   Code Status:   Code Status: Full Code  Mobility Assessment (last 72 hours)     Mobility Assessment     Row Name 07/22/22 1100 07/22/22 0855 07/21/22 1930 07/21/22 1400 07/21/22 1200   Does patient have an order for bedrest or is patient medically unstable -- No - Continue assessment No - Continue assessment -- --   What is the highest level of mobility based  on the progressive mobility assessment? Level 5 (Walks with assist in room/hall) - Balance while stepping forward/back and can walk in room with assist - Complete Level 3 (Stands with assist) - Balance while standing  and cannot march in place Level 3 (Stands with assist) - Balance while standing  and cannot march in place Level 5 (Walks with assist in room/hall) - Balance while stepping forward/back and can walk in room with assist - Complete Level 5 (Walks with assist in room/hall) - Balance while stepping forward/back and can walk in room  with assist - Complete    Row Name 07/21/22 0845 07/20/22 2000 07/20/22 1843 07/20/22 1400 07/19/22 2118   Does patient have an order for bedrest or is patient medically unstable No - Continue assessment No - Continue assessment No - Continue assessment -- No - Continue assessment   What is the highest level of mobility based on the progressive mobility assessment? Level 3 (Stands with assist) - Balance while standing  and cannot march in place Level 3 (Stands with assist) - Balance while standing  and cannot march in place Level 3 (Stands with assist) - Balance while standing  and cannot march in place Level 3 (Stands with assist) - Balance while standing  and cannot march in place Level 5 (Walks with assist in room/hall) - Balance while stepping forward/back and can walk in room with assist - Complete    Row Name 07/19/22 1551           What is the highest level of mobility based on the progressive mobility assessment? Level 5 (Walks with assist in room/hall) - Balance while stepping forward/back and can walk in room with assist - Complete                Objective: Blood pressure 138/79, pulse 83, temperature 98.8 F (37.1 C), temperature source Oral, resp. rate 16, height '5\' 8"'$  (1.727 m), weight 76.2 kg, SpO2 98 %.  Examination:  Physical Exam Constitutional:      Comments: Less fatigued appearing  HENT:     Head: Normocephalic and atraumatic.     Mouth/Throat:     Mouth: Mucous membranes are moist.  Eyes:     Extraocular Movements: Extraocular movements intact.  Cardiovascular:     Rate and Rhythm: Normal rate and regular rhythm.  Pulmonary:     Effort: Pulmonary effort is normal.     Breath sounds: Normal breath sounds.  Abdominal:     General: Bowel sounds are normal. There is no distension.     Palpations: Abdomen is soft.     Tenderness: There is no abdominal tenderness.  Musculoskeletal:     Cervical back: Normal range of motion and neck supple.     Comments: Right  knee wrapped in surgical dressing  Skin:    General: Skin is warm and dry.  Neurological:     General: No focal deficit present.     Mental Status: She is alert.  Psychiatric:        Mood and Affect: Mood normal.       Data Reviewed: Results for orders placed or performed during the hospital encounter of 07/17/22 (from the past 24 hour(s))  Glucose, capillary     Status: Abnormal   Collection Time: 07/21/22  9:12 PM  Result Value Ref Range   Glucose-Capillary 115 (H) 70 - 99 mg/dL  Basic metabolic panel     Status: Abnormal   Collection Time: 07/22/22  3:14 AM  Result  Value Ref Range   Sodium 139 135 - 145 mmol/L   Potassium 3.2 (L) 3.5 - 5.1 mmol/L   Chloride 112 (H) 98 - 111 mmol/L   CO2 21 (L) 22 - 32 mmol/L   Glucose, Bld 104 (H) 70 - 99 mg/dL   BUN 13 8 - 23 mg/dL   Creatinine, Ser 0.78 0.44 - 1.00 mg/dL   Calcium 7.8 (L) 8.9 - 10.3 mg/dL   GFR, Estimated >60 >60 mL/min   Anion gap 6 5 - 15  CBC with Differential/Platelet     Status: Abnormal   Collection Time: 07/22/22  3:14 AM  Result Value Ref Range   WBC 6.1 4.0 - 10.5 K/uL   RBC 2.67 (L) 3.87 - 5.11 MIL/uL   Hemoglobin 8.4 (L) 12.0 - 15.0 g/dL   HCT 26.8 (L) 36.0 - 46.0 %   MCV 100.4 (H) 80.0 - 100.0 fL   MCH 31.5 26.0 - 34.0 pg   MCHC 31.3 30.0 - 36.0 g/dL   RDW 12.5 11.5 - 15.5 %   Platelets 210 150 - 400 K/uL   nRBC 0.0 0.0 - 0.2 %   Neutrophils Relative % 70 %   Neutro Abs 4.4 1.7 - 7.7 K/uL   Lymphocytes Relative 15 %   Lymphs Abs 0.9 0.7 - 4.0 K/uL   Monocytes Relative 11 %   Monocytes Absolute 0.7 0.1 - 1.0 K/uL   Eosinophils Relative 2 %   Eosinophils Absolute 0.1 0.0 - 0.5 K/uL   Basophils Relative 1 %   Basophils Absolute 0.0 0.0 - 0.1 K/uL   Immature Granulocytes 1 %   Abs Immature Granulocytes 0.03 0.00 - 0.07 K/uL  Magnesium     Status: None   Collection Time: 07/22/22  3:14 AM  Result Value Ref Range   Magnesium 2.3 1.7 - 2.4 mg/dL    I have Reviewed nursing notes, Vitals, and Lab  results since pt's last encounter. Pertinent lab results : see above I have ordered test including BMP, CBC, Mg I have reviewed the last note from staff over past 24 hours I have discussed pt's care plan and test results with nursing staff, case manager   LOS: 4 days   Dwyane Dee, MD [facility] 07/22/2022, 1:32 PM

## 2022-07-22 NOTE — Progress Notes (Signed)
Physical Therapy Treatment Patient Details Name: Marie Calhoun MRN: [id number] DOB: August 17, 1951 Today's Date: 07/22/2022   History of Present Illness Pt is a 71yo female presenting s/p R-TKA on 07/17/22. Rapid response 9/1 d/t remors and hypoglycemia. CTA chest negative.  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    PT Comments    Pt continues to progress well with PT following TKA; amb incr distance and requiring less assist with bed mobility, transfers.continue to follow  Recommendations for follow up therapy are one component of a multi-disciplinary discharge planning process, led by the attending physician.  Recommendations may be updated based on patient status, additional functional criteria and insurance authorization.  Follow Up Recommendations  Follow physician's recommendations for discharge plan and follow up therapies     Assistance Recommended at Discharge Intermittent Supervision/Assistance  Patient can return home with the following A little help with walking and/or transfers;A little help with bathing/dressing/bathroom;Assistance with cooking/housework;Assist for transportation;Help with stairs or ramp for entrance   Equipment Recommendations  None recommended by PT    Recommendations for Other Services       Precautions / Restrictions Precautions Precautions: Fall;Knee Restrictions Weight Bearing Restrictions: No RLE Weight Bearing: Weight bearing as tolerated     Mobility  Bed Mobility Overal bed mobility: Needs Assistance Bed Mobility: Supine to Sit, Sit to Supine     Supine to sit: Supervision Sit to supine: Supervision   General bed mobility comments: able to transition to EOB with supervision, incr time, able to progress RLE off bed without use of gait belt; gait belt needed to lift RLE on to bed    Transfers Overall transfer level: Needs assistance Equipment used: Rolling walker (2 wheels) Transfers:  Sit to/from Stand Sit to Stand: Min guard           General transfer comment: assist to rise and transition to RW, supervision for safety    Ambulation/Gait Ambulation/Gait assistance: Supervision, Min guard Gait Distance (Feet): 80 Feet Assistive device: Rolling walker (2 wheels) Gait Pattern/deviations: Step-to pattern, Step-through pattern, Decreased stance time - right       General Gait Details: cues for RW position, incr distance with encouragement   Stairs             Wheelchair Mobility    Modified Rankin (Stroke Patients Only)       Balance Overall balance assessment: Needs assistance         Standing balance support: Reliant on assistive device for balance, During functional activity, Bilateral upper extremity supported Standing balance-Leahy Scale: Fair Standing balance comment: able to static stand without UEs                            Cognition Arousal/Alertness: Awake/alert Behavior During Therapy: WFL for tasks assessed/performed Overall Cognitive Status: Within Functional Limits for tasks assessed                                          Exercises      General Comments        Pertinent Vitals/Pain Pain Assessment Pain Assessment: 0-10 Pain Score: 4  Pain Location: right knee Pain Descriptors / Indicators: Operative site guarding, Sore, Burning, Tender Pain Intervention(s): Limited activity within patient's tolerance, Monitored during session, Premedicated before session, Repositioned    Home Living  Prior Function            PT Goals (current goals can now be found in the care plan section) Acute Rehab PT Goals Patient Stated Goal: to walk better PT Goal Formulation: With patient Time For Goal Achievement: 07/24/22 Potential to Achieve Goals: Good Progress towards PT goals: Progressing toward goals    Frequency    7X/week      PT Plan Current plan  remains appropriate    Co-evaluation              AM-PAC PT "6 Clicks" Mobility   Outcome Measure  Help needed turning from your back to your side while in a flat bed without using bedrails?: A Little Help needed moving from lying on your back to sitting on the side of a flat bed without using bedrails?: A Little Help needed moving to and from a bed to a chair (including a wheelchair)?: A Little Help needed standing up from a chair using your arms (e.g., wheelchair or bedside chair)?: A Little Help needed to walk in hospital room?: A Little Help needed climbing 3-5 steps with a railing? : A Little 6 Click Score: 18    End of Session Equipment Utilized During Treatment: Gait belt Activity Tolerance: Patient tolerated treatment well Patient left: with call bell/phone within reach;in bed;with bed alarm set Nurse Communication: Mobility status PT Visit Diagnosis: Pain;Difficulty in walking, not elsewhere classified (R26.2) Pain - Right/Left: Right Pain - part of body: Knee     Time: 4431-5400 PT Time Calculation (min) (ACUTE ONLY): 23 min  Charges:  $Gait Training: 23-37 mins                     Baxter Flattery, PT  Acute Rehab Dept [facility]) [phone]  [facility] Weekend Pager (Saturday/Sunday only)  [phone]  07/22/2022    [facility] 07/22/2022, 11:38 AM

## 2022-07-22 NOTE — Progress Notes (Signed)
Physical Therapy Treatment Patient Details Name: Marie Calhoun MRN: [id number] DOB: [birth year] Today's Date: 07/22/2022   History of Present Illness Pt is a 71yo female presenting s/p R-TKA on 07/17/22. Rapid response 9/1 d/t remors and hypoglycemia. CTA chest negative.  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    PT Comments    Continues to make excellent progress with PT.  Ready for d/c from PT standpoint.   Recommendations for follow up therapy are one component of a multi-disciplinary discharge planning process, led by the attending physician.  Recommendations may be updated based on patient status, additional functional criteria and insurance authorization.  Follow Up Recommendations  Follow physician's recommendations for discharge plan and follow up therapies     Assistance Recommended at Discharge Intermittent Supervision/Assistance  Patient can return home with the following A little help with walking and/or transfers;A little help with bathing/dressing/bathroom;Assistance with cooking/housework;Assist for transportation;Help with stairs or ramp for entrance   Equipment Recommendations  None recommended by PT    Recommendations for Other Services       Precautions / Restrictions Precautions Precautions: Fall;Knee Restrictions Weight Bearing Restrictions: No RLE Weight Bearing: Weight bearing as tolerated     Mobility  Bed Mobility Overal bed mobility: Needs Assistance Bed Mobility: Supine to Sit, Sit to Supine     Supine to sit: Supervision, Modified independent (Device/Increase time) Sit to supine: Supervision   General bed mobility comments: able to transition to EOB with supervision, incr time, able to progress RLE off bed without use of gait belt; gait belt needed to lift RLE on to bed    Transfers Overall transfer level: Needs assistance Equipment used: Rolling walker (2 wheels) Transfers: Sit to/from  Stand Sit to Stand: Min guard, Supervision           General transfer comment: supervision for safety    Ambulation/Gait Ambulation/Gait assistance: Supervision, Min guard Gait Distance (Feet): 90 Feet Assistive device: Rolling walker (2 wheels) Gait Pattern/deviations: Step-to pattern, Step-through pattern, Decreased stance time - right       General Gait Details: cues for RW position, incr distance with encouragement   Stairs             Wheelchair Mobility    Modified Rankin (Stroke Patients Only)       Balance Overall balance assessment: Needs assistance         Standing balance support: Reliant on assistive device for balance, During functional activity, Bilateral upper extremity supported Standing balance-Leahy Scale: Fair Standing balance comment: able to static stand without UEs                            Cognition Arousal/Alertness: Awake/alert Behavior During Therapy: WFL for tasks assessed/performed Overall Cognitive Status: Within Functional Limits for tasks assessed                                          Exercises Total Joint Exercises Ankle Circles/Pumps: AROM, Both, 10 reps Quad Sets: AROM, Both, 10 reps, Supine Heel Slides: AAROM, Both, 10 reps    General Comments        Pertinent Vitals/Pain Pain Assessment Pain Assessment: 0-10 Pain Score: 4  Pain Location: right knee Pain Descriptors / Indicators: Operative site guarding, Sore, Burning, Tender Pain Intervention(s): Limited activity within patient's tolerance, Monitored during session, Premedicated before  session, Repositioned    Home Living                          Prior Function            PT Goals (current goals can now be found in the care plan section) Acute Rehab PT Goals Patient Stated Goal: to walk better PT Goal Formulation: With patient Time For Goal Achievement: 07/24/22 Potential to Achieve Goals: Good Progress  towards PT goals: Progressing toward goals    Frequency    7X/week      PT Plan Current plan remains appropriate    Co-evaluation              AM-PAC PT "6 Clicks" Mobility   Outcome Measure  Help needed turning from your back to your side while in a flat bed without using bedrails?: A Little Help needed moving from lying on your back to sitting on the side of a flat bed without using bedrails?: A Little Help needed moving to and from a bed to a chair (including a wheelchair)?: A Little Help needed standing up from a chair using your arms (e.g., wheelchair or bedside chair)?: A Little Help needed to walk in hospital room?: A Little Help needed climbing 3-5 steps with a railing? : A Little 6 Click Score: 18    End of Session Equipment Utilized During Treatment: Gait belt Activity Tolerance: Patient tolerated treatment well Patient left: with call bell/phone within reach;in bed;with bed alarm set Nurse Communication: Mobility status PT Visit Diagnosis: Pain;Difficulty in walking, not elsewhere classified (R26.2) Pain - Right/Left: Right Pain - part of body: Knee     Time: 1430-1450 PT Time Calculation (min) (ACUTE ONLY): 20 min  Charges:  $Gait Training: 8-22 mins                     Baxter Flattery, PT  Acute Rehab Dept [facility]) [phone]  [facility] Weekend Pager (Saturday/Sunday only)  [phone]  07/22/2022    [facility] 07/22/2022, 4:29 PM

## 2022-07-22 NOTE — Care Management Important Message (Signed)
Important Message  Patient Details IM Letter given to the Patient. Name: Marie Calhoun MRN: [id number] Date of Birth: 1951/02/07   Medicare Important Message Given:  Yes     Kerin Salen 07/22/2022, 11:49 AM

## 2022-07-23 DIAGNOSIS — M1711 Unilateral primary osteoarthritis, right knee: Secondary | ICD-10-CM | POA: Diagnosis not present

## 2022-07-23 DIAGNOSIS — R0902 Hypoxemia: Secondary | ICD-10-CM | POA: Diagnosis not present

## 2022-07-23 DIAGNOSIS — E162 Hypoglycemia, unspecified: Secondary | ICD-10-CM | POA: Diagnosis not present

## 2022-07-23 LAB — CBC WITH DIFFERENTIAL/PLATELET
Abs Immature Granulocytes: 0.03 10*3/uL (ref 0.00–0.07)
Basophils Absolute: 0 10*3/uL (ref 0.0–0.1)
Basophils Relative: 1 %
Eosinophils Absolute: 0.2 10*3/uL (ref 0.0–0.5)
Eosinophils Relative: 3 %
HCT: 27.2 % — ABNORMAL LOW (ref 36.0–46.0)
Hemoglobin: 8.7 g/dL — ABNORMAL LOW (ref 12.0–15.0)
Immature Granulocytes: 1 %
Lymphocytes Relative: 15 %
Lymphs Abs: 0.8 10*3/uL (ref 0.7–4.0)
MCH: 31.4 pg (ref 26.0–34.0)
MCHC: 32 g/dL (ref 30.0–36.0)
MCV: 98.2 fL (ref 80.0–100.0)
Monocytes Absolute: 0.7 10*3/uL (ref 0.1–1.0)
Monocytes Relative: 13 %
Neutro Abs: 3.7 10*3/uL (ref 1.7–7.7)
Neutrophils Relative %: 67 %
Platelets: 247 10*3/uL (ref 150–400)
RBC: 2.77 MIL/uL — ABNORMAL LOW (ref 3.87–5.11)
RDW: 12.4 % (ref 11.5–15.5)
WBC: 5.5 10*3/uL (ref 4.0–10.5)
nRBC: 0 % (ref 0.0–0.2)

## 2022-07-23 LAB — BASIC METABOLIC PANEL
Anion gap: 5 (ref 5–15)
BUN: 8 mg/dL (ref 8–23)
CO2: 22 mmol/L (ref 22–32)
Calcium: 8.3 mg/dL — ABNORMAL LOW (ref 8.9–10.3)
Chloride: 112 mmol/L — ABNORMAL HIGH (ref 98–111)
Creatinine, Ser: 0.68 mg/dL (ref 0.44–1.00)
GFR, Estimated: >60 mL/min (ref >60)
Glucose, Bld: 97 mg/dL (ref 70–99)
Potassium: 3.6 mmol/L (ref 3.5–5.1)
Sodium: 139 mmol/L (ref 135–145)

## 2022-07-23 LAB — MAGNESIUM: Magnesium: 2 mg/dL (ref 1.7–2.4)

## 2022-07-23 MED ORDER — POTASSIUM CHLORIDE CRYS ER 20 MEQ PO TBCR
40.0000 meq | EXTENDED_RELEASE_TABLET | Freq: Once | ORAL | Status: AC
  Administered 2022-07-23: 40 meq via ORAL
  Filled 2022-07-23: qty 2

## 2022-07-23 NOTE — Progress Notes (Signed)
Physical Therapy Treatment Patient Details Name: Marie Calhoun MRN: [id number] DOB: 1950-12-07 Today's Date: 07/23/2022   History of Present Illness Pt is a 71yo female presenting s/p R-TKA on 07/17/22. Rapid response 9/1 d/t remors and hypoglycemia. CTA chest negative.  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    PT Comments    Pt continues to progress demonstrating incr activity tolerance, knee ROM improving.  Pt remains motivated to work with PT and is hopeful to d/c home soon.  Recommendations for follow up therapy are one component of a multi-disciplinary discharge planning process, led by the attending physician.  Recommendations may be updated based on patient status, additional functional criteria and insurance authorization.  Follow Up Recommendations  Follow physician's recommendations for discharge plan and follow up therapies     Assistance Recommended at Discharge Intermittent Supervision/Assistance  Patient can return home with the following A little help with walking and/or transfers;A little help with bathing/dressing/bathroom;Assistance with cooking/housework;Assist for transportation;Help with stairs or ramp for entrance   Equipment Recommendations  None recommended by PT    Recommendations for Other Services       Precautions / Restrictions Precautions Precautions: Fall;Knee Restrictions Weight Bearing Restrictions: No RLE Weight Bearing: Weight bearing as tolerated     Mobility  Bed Mobility Overal bed mobility: Needs Assistance Bed Mobility: Sit to Supine     Supine to sit: Modified independent (Device/Increase time) Sit to supine: Modified independent (Device/Increase time)   General bed mobility comments: no physical assist, uses leg lifter to self assist.    Transfers Overall transfer level: Needs assistance Equipment used: Rolling walker (2 wheels) Transfers: Sit to/from Stand Sit to Stand:  Supervision           General transfer comment: supervision for safety    Ambulation/Gait Ambulation/Gait assistance: Supervision Gait Distance (Feet): 80 Feet (10' more) Assistive device: Rolling walker (2 wheels) Gait Pattern/deviations: Step-to pattern, Step-through pattern, Decreased stance time - right Gait velocity: decreased     General Gait Details: cues for RW position, working on R heel strike, knee extension in stance   Stairs             Wheelchair Mobility    Modified Rankin (Stroke Patients Only)       Balance Overall balance assessment: Needs assistance         Standing balance support: Reliant on assistive device for balance, During functional activity, Bilateral upper extremity supported Standing balance-Leahy Scale: Fair Standing balance comment: able to static stand without UEs                            Cognition Arousal/Alertness: Awake/alert Behavior During Therapy: WFL for tasks assessed/performed Overall Cognitive Status: Within Functional Limits for tasks assessed                                          Exercises Total Joint Exercises Quad Sets: AROM, Both, 10 reps Heel Slides: AAROM, Both, 10 reps Long Arc Quad: AROM, Right, 10 reps Goniometric ROM: grossly 6 to 75 degrees R knee flexion (sitting knee flexion x5)    General Comments        Pertinent Vitals/Pain Pain Assessment Pain Assessment: 0-10 Pain Score: 3  Pain Location: right knee Pain Descriptors / Indicators: Operative site guarding, Sore, Burning, Tender Pain Intervention(s): Limited activity  within patient's tolerance, Monitored during session, Repositioned    Home Living                          Prior Function            PT Goals (current goals can now be found in the care plan section) Acute Rehab PT Goals Patient Stated Goal: to walk better PT Goal Formulation: With patient Time For Goal Achievement:  07/24/22 Potential to Achieve Goals: Good Progress towards PT goals: Progressing toward goals    Frequency    7X/week      PT Plan Current plan remains appropriate    Co-evaluation              AM-PAC PT "6 Clicks" Mobility   Outcome Measure  Help needed turning from your back to your side while in a flat bed without using bedrails?: None Help needed moving from lying on your back to sitting on the side of a flat bed without using bedrails?: None Help needed moving to and from a bed to a chair (including a wheelchair)?: A Little Help needed standing up from a chair using your arms (e.g., wheelchair or bedside chair)?: A Little Help needed to walk in hospital room?: A Little Help needed climbing 3-5 steps with a railing? : A Little 6 Click Score: 20    End of Session Equipment Utilized During Treatment: Gait belt Activity Tolerance: Patient tolerated treatment well Patient left: with call bell/phone within reach;in bed;with bed alarm set Nurse Communication: Mobility status PT Visit Diagnosis: Pain;Difficulty in walking, not elsewhere classified (R26.2) Pain - Right/Left: Right Pain - part of body: Knee     Time: 7846-9629 PT Time Calculation (min) (ACUTE ONLY): 26 min  Charges:  $Gait Training: 8-22 mins $Therapeutic Exercise: 8-22 mins                     Baxter Flattery, PT  Acute Rehab Dept [facility]) [phone]  [facility] Weekend Pager (Saturday/Sunday only)  [phone]  07/23/2022    [facility] 07/23/2022, 2:21 PM

## 2022-07-23 NOTE — Progress Notes (Signed)
Physical Therapy Treatment Patient Details Name: Marie Calhoun MRN: [id number] DOB: 1951/01/22 Today's Date: 07/23/2022   History of Present Illness Pt is a 71yo female presenting s/p R-TKA on 07/17/22. Rapid response 9/1 d/t remors and hypoglycemia. CTA chest negative.  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    PT Comments    Continues to progressing/meeting PT goals, ready to d/c from PT standpoint when cleared by medical service. Continue to follow in acute setting   Recommendations for follow up therapy are one component of a multi-disciplinary discharge planning process, led by the attending physician.  Recommendations may be updated based on patient status, additional functional criteria and insurance authorization.  Follow Up Recommendations  Follow physician's recommendations for discharge plan and follow up therapies     Assistance Recommended at Discharge Intermittent Supervision/Assistance  Patient can return home with the following A little help with walking and/or transfers;A little help with bathing/dressing/bathroom;Assistance with cooking/housework;Assist for transportation;Help with stairs or ramp for entrance   Equipment Recommendations  None recommended by PT    Recommendations for Other Services       Precautions / Restrictions Precautions Precautions: Fall;Knee Restrictions Weight Bearing Restrictions: No RLE Weight Bearing: Weight bearing as tolerated     Mobility  Bed Mobility Overal bed mobility: Needs Assistance Bed Mobility: Supine to Sit, Sit to Supine     Supine to sit: Modified independent (Device/Increase time)     General bed mobility comments: no physical assist    Transfers Overall transfer level: Needs assistance Equipment used: Rolling walker (2 wheels) Transfers: Sit to/from Stand Sit to Stand: Supervision           General transfer comment: supervision for safety     Ambulation/Gait Ambulation/Gait assistance: Supervision Gait Distance (Feet): 85 Feet Assistive device: Rolling walker (2 wheels) Gait Pattern/deviations: Step-to pattern, Step-through pattern, Decreased stance time - right Gait velocity: decreased     General Gait Details: cues for RW position, workign on R heel strike, knee extension in stance   Stairs             Wheelchair Mobility    Modified Rankin (Stroke Patients Only)       Balance Overall balance assessment: Needs assistance         Standing balance support: Reliant on assistive device for balance, During functional activity, Bilateral upper extremity supported Standing balance-Leahy Scale: Fair Standing balance comment: able to static stand without UEs                            Cognition Arousal/Alertness: Awake/alert Behavior During Therapy: WFL for tasks assessed/performed Overall Cognitive Status: Within Functional Limits for tasks assessed                                          Exercises Total Joint Exercises Long Arc Quad: AROM, Right, 10 reps    General Comments        Pertinent Vitals/Pain Pain Assessment Pain Assessment: 0-10 Pain Score: 4  Pain Location: right knee Pain Descriptors / Indicators: Operative site guarding, Sore, Burning, Tender Pain Intervention(s): Limited activity within patient's tolerance, Monitored during session, Premedicated before session, Repositioned, Ice applied    Home Living  Prior Function            PT Goals (current goals can now be found in the care plan section) Acute Rehab PT Goals Patient Stated Goal: to walk better PT Goal Formulation: With patient Time For Goal Achievement: 07/24/22 Potential to Achieve Goals: Good Progress towards PT goals: Progressing toward goals    Frequency    7X/week      PT Plan Current plan remains appropriate    Co-evaluation               AM-PAC PT "6 Clicks" Mobility   Outcome Measure  Help needed turning from your back to your side while in a flat bed without using bedrails?: None Help needed moving from lying on your back to sitting on the side of a flat bed without using bedrails?: None Help needed moving to and from a bed to a chair (including a wheelchair)?: A Little Help needed standing up from a chair using your arms (e.g., wheelchair or bedside chair)?: A Little Help needed to walk in hospital room?: A Little Help needed climbing 3-5 steps with a railing? : A Little 6 Click Score: 20    End of Session Equipment Utilized During Treatment: Gait belt Activity Tolerance: Patient tolerated treatment well Patient left: with call bell/phone within reach;in chair;with chair alarm set Nurse Communication: Mobility status PT Visit Diagnosis: Pain;Difficulty in walking, not elsewhere classified (R26.2) Pain - Right/Left: Right Pain - part of body: Knee     Time: 9798-9211 PT Time Calculation (min) (ACUTE ONLY): 24 min  Charges:  $Gait Training: 23-37 mins                     Baxter Flattery, PT  Acute Rehab Dept [facility]) [phone]  [facility] Weekend Pager (Saturday/Sunday only)  [phone]  07/23/2022    [facility] 07/23/2022, 11:57 AM

## 2022-07-23 NOTE — Progress Notes (Signed)
Consult Progress Note    Marie Calhoun   GQQ:[id number]  DOB: 19-Jun-1951  DOA: 07/17/2022     5 PCP: Earney Mallet, MD   Hospital Course: Marie Calhoun is a 71 y.o. female with past medical history of cutaneous lupus, GERD, asthma, Charcot-Marie-Tooth. Presenting with right knee pain. Admitted to the ortho service for a total right knee. She has successfully completed that procedure.  She began having some episodes of hypoglycemia.  She has no known history of diabetes nor any prolonged steroid use. She was also found to have some hypoxia and was started on 2 L nasal cannula. CTA chest was performed on 07/19/2022 which was negative for PE.  It did reveal some bibasilar atelectasis.  She was encouraged to start working with incentive spirometer. Consult was requested for work-up regarding hypoglycemia and hypoxia.  Interval History:  Continues to improve.  Denies any vomiting and nausea has tremendously improved and is amenable.  She has been tolerating clear liquids and was eating some pancakes when seen this morning after being advanced to solids. She possibly may discharge home today or tomorrow per orthopedic surgery.  Assessment and Plan:  Hypoglycemia -resolved -No known history of diabetes.  A1c 4.7%.  - etiology still appears just possibly poor intake but will continue to hold Linzess for now still -Glucose levels have remained stable without further need for fluids - okay to resume Linzess at discharge  Nausea/vomiting -resolved Mild ileus - resolved -Abdominal x-ray obtained 9/3 showing mild gaseous distention of the colon.  Agree this appears to look like mild ileus.  She is nontender and no obvious obstruction appreciated on x-ray imaging - tolerated CLD - advanced to solid diet today per primary and she was tolerating when seen this morning  Acute hypoxia - resolved  Hx of asthma - CTA chest shows bibasilar atelectasis -No PE or concern for underlying infection -  Continue encouraging incentive spirometry use - O2 has been weaned off; remains on RA -Out of bed to chair as able  Sinus tachycardia - resolved  NSVT - noted on EKG on 9/1 - multifactorial: pain, nausea, volume depletion some, anemia - continue current treatments in place as already being done - unless were to become sustained much higher, will hold off on a BB at this time - monitor K and Mg esp with ileus; will target K>4 and Mg>2   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   Old records reviewed in assessment of this patient   DVT prophylaxis:  SCDs Start: 07/17/22 1114 Place TED hose Start: 07/17/22 1114   Code Status:   Code Status: Full Code  Mobility Assessment (last 72 hours)     Mobility Assessment     Row Name 07/23/22 1400 07/23/22 1100 07/23/22 0755 07/22/22 1600 07/22/22 1100   Does patient have an order for bedrest or is patient medically unstable -- -- No - Continue assessment -- --   What is the highest level of mobility based on the progressive  mobility assessment? Level 5 (Walks with assist in room/hall) - Balance while stepping forward/back and can walk in room with assist - Complete Level 5 (Walks with assist in room/hall) - Balance while stepping forward/back and can walk in room with assist - Complete Level 5 (Walks with assist in room/hall) - Balance while stepping forward/back and can walk in room with assist - Complete Level 5 (Walks with assist in room/hall) - Balance while stepping forward/back and can walk in room with  assist - Complete Level 5 (Walks with assist in room/hall) - Balance while stepping forward/back and can walk in room with assist - Complete   Is the above level different from baseline mobility prior to current illness? -- -- No - Consider discontinuing PT/OT -- --    Row Name 07/22/22 0855 07/21/22 1930 07/21/22 1400 07/21/22 1200 07/21/22 0845   Does patient have an order for bedrest or is patient medically unstable No - Continue assessment No - Continue assessment -- -- No - Continue assessment   What is the highest level of mobility based on the progressive mobility assessment? Level 3 (Stands with assist) - Balance while standing  and cannot march in place Level 3 (Stands with assist) - Balance while standing  and cannot march in place Level 5 (Walks with assist in room/hall) - Balance while stepping forward/back and can walk in room with assist - Complete Level 5 (Walks with assist in room/hall) - Balance while stepping forward/back and can walk in room with assist - Complete Level 3 (Stands with assist) - Balance while standing  and cannot march in place    Row Name 07/20/22 2000 07/20/22 1843         Does patient have an order for bedrest or is patient medically unstable No - Continue assessment No - Continue assessment      What is the highest level of mobility based on the progressive mobility assessment? Level 3 (Stands with assist) - Balance while standing  and cannot march in place Level 3 (Stands with assist) - Balance while standing  and cannot march in place               Objective: Blood pressure 139/79, pulse 89, temperature 98.3 F (36.8 C), temperature source Oral, resp. rate 18, height '5\' 8"'$  (1.727 m), weight 76.2 kg, SpO2 100 %.  Examination:  Physical Exam Constitutional:      Comments: Less fatigued appearing  HENT:     Head: Normocephalic and atraumatic.     Mouth/Throat:     Mouth: Mucous membranes are moist.  Eyes:     Extraocular Movements: Extraocular  movements intact.  Cardiovascular:     Rate and Rhythm: Normal rate and regular rhythm.  Pulmonary:     Effort: Pulmonary effort is normal.     Breath sounds: Normal breath sounds.  Abdominal:     General: Bowel sounds are normal. There is no distension.     Palpations: Abdomen is soft.     Tenderness: There is no abdominal tenderness.  Musculoskeletal:     Cervical back: Normal range of motion and neck supple.     Comments: Right knee wrapped in surgical dressing; edema appreciated but stable   Skin:    General: Skin is warm and dry.  Neurological:     General: No focal deficit present.     Mental Status: She is alert.  Psychiatric:        Mood and Affect: Mood normal.  Data Reviewed: Results for orders placed or performed during the hospital encounter of 07/17/22 (from the past 24 hour(s))  Basic metabolic panel     Status: Abnormal   Collection Time: 07/23/22  3:47 AM  Result Value Ref Range   Sodium 139 135 - 145 mmol/L   Potassium 3.6 3.5 - 5.1 mmol/L   Chloride 112 (H) 98 - 111 mmol/L   CO2 22 22 - 32 mmol/L   Glucose, Bld 97 70 - 99 mg/dL   BUN 8 8 - 23 mg/dL   Creatinine, Ser 0.68 0.44 - 1.00 mg/dL   Calcium 8.3 (L) 8.9 - 10.3 mg/dL   GFR, Estimated >60 >60 mL/min   Anion gap 5 5 - 15  CBC with Differential/Platelet     Status: Abnormal   Collection Time: 07/23/22  3:47 AM  Result Value Ref Range   WBC 5.5 4.0 - 10.5 K/uL   RBC 2.77 (L) 3.87 - 5.11 MIL/uL   Hemoglobin 8.7 (L) 12.0 - 15.0 g/dL   HCT 27.2 (L) 36.0 - 46.0 %   MCV 98.2 80.0 - 100.0 fL   MCH 31.4 26.0 - 34.0 pg   MCHC 32.0 30.0 - 36.0 g/dL   RDW 12.4 11.5 - 15.5 %   Platelets 247 150 - 400 K/uL   nRBC 0.0 0.0 - 0.2 %   Neutrophils Relative % 67 %   Neutro Abs 3.7 1.7 - 7.7 K/uL   Lymphocytes Relative 15 %   Lymphs Abs 0.8 0.7 - 4.0 K/uL   Monocytes Relative 13 %   Monocytes Absolute 0.7 0.1 - 1.0 K/uL   Eosinophils Relative 3 %   Eosinophils Absolute 0.2 0.0 - 0.5 K/uL   Basophils  Relative 1 %   Basophils Absolute 0.0 0.0 - 0.1 K/uL   Immature Granulocytes 1 %   Abs Immature Granulocytes 0.03 0.00 - 0.07 K/uL  Magnesium     Status: None   Collection Time: 07/23/22  3:47 AM  Result Value Ref Range   Magnesium 2.0 1.7 - 2.4 mg/dL    I have Reviewed nursing notes, Vitals, and Lab results since pt's last encounter. Pertinent lab results : see above I have ordered test including BMP, CBC, Mg I have reviewed the last note from staff over past 24 hours I have discussed pt's care plan and test results with nursing staff, case manager   LOS: 5 days   Dwyane Dee, MD [facility] 07/23/2022, 2:32 PM

## 2022-07-23 NOTE — Progress Notes (Signed)
    Subjective:  Patient reports pain as mild.  Denies N/V/CP/SOB/Abd pain. She denies any nausea/vomiting since Sunday. She states that the patch has really helped her. She is passing gas. She states she is feeling a lot better. She is ready to be advanced from her clear liquid diet.   Her knee pain is well controlled. She states her knee feels great. She reports doing well with PT.   Objective:   VITALS:   Vitals:   07/22/22 1228 07/22/22 2049 07/23/22 0300 07/23/22 0544  BP: 138/79 132/73 (!) 143/82 138/88  Pulse: 83 96 83 85  Resp: 16 17 (!) 23 17  Temp: 98.8 F (37.1 C) 98.4 F (36.9 C) 98.5 F (36.9 C) 98.5 F (36.9 C)  TempSrc: Oral  Oral   SpO2: 98% 95% 99% 97%  Weight:      Height:        Patient is lying comfortably in bed. Her overall appearance has improved. NAD.  Neurologically intact ABD soft Neurovascular intact Sensation intact distally Intact pulses distally Dorsiflexion/Plantar flexion intact Incision: dressing C/D/I No cellulitis present Compartment soft   Lab Results  Component Value Date   WBC 5.5 07/23/2022   HGB 8.7 (L) 07/23/2022   HCT 27.2 (L) 07/23/2022   MCV 98.2 07/23/2022   PLT 247 07/23/2022   BMET    Component Value Date/Time   NA 139 07/23/2022 0347   K 3.6 07/23/2022 0347   CL 112 (H) 07/23/2022 0347   CO2 22 07/23/2022 0347   GLUCOSE 97 07/23/2022 0347   BUN 8 07/23/2022 0347   CREATININE 0.68 07/23/2022 0347   CALCIUM 8.3 (L) 07/23/2022 0347   GFRNONAA >60 07/23/2022 0347     Assessment/Plan: 6 Days Post-Op   Principal Problem:   Osteoarthritis of right knee Active Problems:   Charcot-Marie-Tooth disease of neuronal type   Asthma   GERD (gastroesophageal reflux disease)   Cutaneous lupus erythematosus   Hepatic lesion   Hypoxia   Hypoglycemia   Hypokalemia  Hemoglobin stable at 8.7.  Potassium 3.6 this morning.  SPO2 97 on RA. Glucose 97 this morning.   WBAT with walker DVT ppx: Aspirin, SCDs,  TEDS PO pain control: continue to minimize narcotics. She has been taking tylenol for pain. She has not had narcotics since 9/1.   PT/OT: She ambulated 80 and 90 feet yesterday with PT. She has been cleared by PT.  Dispo: Advance diet from clear liquids to regular diet today. Continue to monitor for N/V. Continue Zofran and Reglan prn, and scopolamine patch. If she is tolerating her regular diet she can d/c home today.    Charlott Rakes, PA-C 07/23/2022, 8:34 AM    [facility]  [facility] [address], [place], [place] [address] Phone: [phone] [email or web address] Facebook  [employer]

## 2022-07-24 DIAGNOSIS — M1711 Unilateral primary osteoarthritis, right knee: Secondary | ICD-10-CM | POA: Diagnosis not present

## 2022-07-24 MED ORDER — SCOPOLAMINE 1 MG/3DAYS TD PT72: 1.0000 | MEDICATED_PATCH | TRANSDERMAL | 0 refills | Status: DC

## 2022-07-24 NOTE — Plan of Care (Signed)
  Problem: Activity: Goal: Ability to avoid complications of mobility impairment will improve Outcome: Progressing   Problem: Pain Management: Goal: Pain level will decrease with appropriate interventions Outcome: Progressing   Problem: Skin Integrity: Goal: Will show signs of wound healing Outcome: Progressing   Problem: Clinical Measurements: Goal: Will remain free from infection Outcome: Progressing Goal: Diagnostic test results will improve Outcome: Progressing

## 2022-07-24 NOTE — Plan of Care (Signed)
  Problem: Education: Goal: Knowledge of General Education information will improve Description Including pain rating scale, medication(s)/side effects and non-pharmacologic comfort measures Outcome: Progressing   Problem: Health Behavior/Discharge Planning: Goal: Ability to manage health-related needs will improve Outcome: Progressing   

## 2022-07-24 NOTE — Progress Notes (Signed)
  Transition of Care [facility]) Screening Note   Patient Details  Name: Marie Calhoun Date of Birth: Feb 21, 1951   Transition of Care [facility]) CM/SW Contact:    Roseanne Kaufman, RN Phone Number: 07/24/2022, 9:35 AM    Transition of Care Department [facility]) has reviewed patient and no TOC needs have been identified at this time. We will continue to monitor patient advancement through interdisciplinary progression rounds. If new patient transition needs arise, please place a TOC consult.

## 2022-07-24 NOTE — Progress Notes (Signed)
PROGRESS NOTE  Marie Calhoun  TDH:[id number] DOB: 22-Mar-1951 DOA: 07/17/2022 PCP: Earney Mallet, MD   Brief Narrative: Marie Calhoun is a 71 y.o. female with past medical history of cutaneous lupus, GERD, asthma, Charcot-Marie-Tooth. Presenting with right knee pain. Admitted to the ortho service for a total right knee. She has successfully completed that procedure.  She began having some episodes of hypoglycemia.  She has no known history of diabetes nor any prolonged steroid use. She was also found to have some hypoxia and was started on 2 L nasal cannula. CTA chest was performed on 07/19/2022 which was negative for PE.  It did reveal some bibasilar atelectasis.  She was encouraged to start working with incentive spirometer. We were consulted  for work-up regarding hypoglycemia and hypoxia.  These  problems have resolved.  Currently she is comfortable on room air.  Orthopedics planning for discharge today.  Assessment & Plan:  Principal Problem:   Osteoarthritis of right knee Active Problems:   Charcot-Marie-Tooth disease of neuronal type   Asthma   GERD (gastroesophageal reflux disease)   Cutaneous lupus erythematosus   Hepatic lesion   Hypoxia   Hypoglycemia   Hypokalemia   Hypoglycemia -resolved -No known history of diabetes.  A1c 4.7%.  -Glucose levels have remained stable without further need for fluids - okay to resume Linzess at discharge   Nausea/vomiting -resolved Mild ileus - resolved -Abdominal x-ray obtained 9/3 showing mild gaseous distention of the colon.    She is nontender and no obvious obstruction appreciated on x-ray imaging - advanced to solid diet  and she is tolerating   Acute hypoxia - resolved  Hx of asthma - CTA chest shows bibasilar atelectasis -No PE or concern for underlying infection - On room air   Sinus tachycardia - resolved  NSVT - noted on EKG on 9/1   Left hepatic lobe cyst - confirmed on ultrasound on 07/19/2022 - Cyst measures 2.4  cm, stable since 08/31/2021 - No further work-up   Cutaneous lupus - home regimen held for surgery - hx rare ADE to protonix (tolerates prevacid at home) -continue home regimen on dc   GERD - on prevacid at home (cannot have protonix due to hx cutaneous lupus)   Hypokalemia -Corrected   Right knee pain, s/p TRKA - per primary team          DVT prophylaxis:SCDs Start: 07/17/22 1114 Place TED hose Start: 07/17/22 1114     Code Status: Full Code  Family Communication: family member at bedside  Calhoun status: Inpatient  Calhoun is from : Home  Anticipated discharge to: Home  Estimated DC date:today   Antimicrobials:  Anti-infectives (From admission, onward)    Start     Dose/Rate Route Frequency Ordered Stop   07/17/22 1330  ceFAZolin (ANCEF) IVPB 2g/100 mL premix        2 g 200 mL/hr over 30 Minutes Intravenous Every 6 hours 07/17/22 1114 07/17/22 1913   07/17/22 1113  acyclovir (ZOVIRAX) tablet 400 mg        400 mg Oral 3 times daily PRN 07/17/22 1113     07/17/22 0600  ceFAZolin (ANCEF) IVPB 2g/100 mL premix        2 g 200 mL/hr over 30 Minutes Intravenous On call to O.R. 07/17/22 6468 07/17/22 0739       Subjective: Calhoun seen and examined at the bedside this morning.  She was about to leave the hospital after she was given discharge orders.  She looks comfortable,  on room air.  Denies any complaints.  She was thankful  Objective: Vitals:   07/23/22 1211 07/23/22 1310 07/23/22 2118 07/24/22 0458  BP:  139/79 (!) 145/87 (!) 151/82  Pulse:  89 95 89  Resp: (!) '21 18 17 15  '$ Temp:  98.3 F (36.8 C) 98.9 F (37.2 C) 98 F (36.7 C)  TempSrc:  Oral Oral Oral  SpO2:  100% 95% 95%  Weight:      Height:        Intake/Output Summary (Last 24 hours) at 07/24/2022 1111 Last data filed at 07/24/2022 0229 Gross per 24 hour  Intake 600 ml  Output --  Net 600 ml   Filed Weights   07/17/22 0540  Weight: 76.2 kg    Examination:  General exam:  Overall comfortable, not in distress, pleasant female HEENT: PERRL Respiratory system:  no wheezes or crackles  Cardiovascular system: S1 & S2 heard, RRR.  Gastrointestinal system: Abdomen is nondistended, soft and nontender. Central nervous system: Alert and oriented Extremities: No edema, no clubbing ,no cyanosis, surgical wound on the right knee Skin: No rashes, no ulcers,no icterus     Data Reviewed: I have personally reviewed following labs and imaging studies  CBC: Recent Labs  Lab 07/18/22 1925 07/19/22 0412 07/19/22 1558 07/22/22 0314 07/23/22 0347  WBC 7.3 8.0 8.6 6.1 5.5  NEUTROABS  --   --  6.9 4.4 3.7  HGB 10.2* 9.9* 9.2* 8.4* 8.7*  HCT 30.7* 30.9* 28.0* 26.8* 27.2*  MCV 97.8 98.7 96.9 100.4* 98.2  PLT 194 187 184 210 956   Basic Metabolic Panel: Recent Labs  Lab 07/19/22 0412 07/19/22 1158 07/19/22 1558 07/21/22 0313 07/22/22 0314 07/23/22 0347  NA 135  --  134* 137 139 139  K 3.8  --  3.5 3.9 3.2* 3.6  CL 106  --  105 109 112* 112*  CO2 25  --  23 22 21* 22  GLUCOSE 110*  --  138* 121* 104* 97  BUN 15  --  '13 15 13 8  '$ CREATININE 0.91  --  0.83 0.80 0.78 0.68  CALCIUM 8.0*  --  8.0* 7.9* 7.8* 8.3*  MG  --  1.6*  --  1.7 2.3 2.0     No results found for this or any previous visit (from the past 240 hour(s)).   Radiology Studies: No results found.  Scheduled Meds:  aspirin  81 mg Oral BID   cycloSPORINE  1 drop Both Eyes BID   docusate sodium  100 mg Oral BID   gabapentin  300 mg Oral TID   lansoprazole  30 mg Oral Q1200   magnesium oxide  400 mg Oral BID   scopolamine  1 patch Transdermal Q72H   senna  1 tablet Oral BID   Continuous Infusions:  methocarbamol (ROBAXIN) IV       LOS: 6 days   Shelly Coss, MD [facility] P9/04/2022, 11:11 AM

## 2022-07-24 NOTE — Evaluation (Addendum)
Physical Therapy Treatment Patient Details Name: Marie Calhoun MRN: [id number] DOB: August 04, 1951 Today's Date: 07/24/2022  History of Present Illness  Pt is a 71yo female presenting s/p R-TKA on 07/17/22. Rapid response 9/1 d/t remors and hypoglycemia. CTA chest negative.  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    Clinical Impression  Patient progressing well with mobility. Ambulated ~70' with RW and supervision with guarding provided by her friend. Pt complete curb negotiation with forward and backward pattern with assist from friend, pt more stable/steady with backward step up. Reviewed seated exercises at EOS and addressed questions for discharge. Pt is mobilizing at safe level to discharge home with assist from friends. Will progress as able in acute setting.     Recommendations for follow up therapy are one component of a multi-disciplinary discharge planning process, led by the attending physician.  Recommendations may be updated based on patient status, additional functional criteria and insurance authorization.  Follow Up Recommendations Follow physician's recommendations for discharge plan and follow up therapies      Assistance Recommended at Discharge Intermittent Supervision/Assistance  Patient can return home with the following  A little help with walking and/or transfers;A little help with bathing/dressing/bathroom;Assistance with cooking/housework;Assist for transportation;Help with stairs or ramp for entrance    Equipment Recommendations None recommended by PT  Recommendations for Other Services       Functional Status Assessment       Precautions / Restrictions Precautions Precautions: Fall;Knee Restrictions Weight Bearing Restrictions: No RLE Weight Bearing: Weight bearing as tolerated      Mobility  Bed Mobility               General bed mobility comments: pt sitting EOB at start of session     Transfers Overall transfer level: Needs assistance Equipment used: Rolling walker (2 wheels) Transfers: Sit to/from Stand Sit to Stand: Supervision           General transfer comment: supervision for safety    Ambulation/Gait Ambulation/Gait assistance: Supervision Gait Distance (Feet): 70 Feet Assistive device: Rolling walker (2 wheels) Gait Pattern/deviations: Step-to pattern, Decreased stance time - right, Decreased stride length, Decreased weight shift to right Gait velocity: decreased     General Gait Details: pt maintained safe proximity to RW, no LOB throughout, slightly flexed posture but improved when walker height adjusted to ~  Stairs Stairs: Yes Stairs assistance: Min guard, Min assist Stair Management: No rails, Step to pattern, Forwards, Backwards, With walker Number of Stairs: 1 General stair comments: cues for sequencing  Wheelchair Mobility    Modified Rankin (Stroke Patients Only)       Balance Overall balance assessment: Needs assistance Sitting-balance support: Feet supported, No upper extremity supported Sitting balance-Leahy Scale: Good     Standing balance support: Reliant on assistive device for balance, During functional activity, Bilateral upper extremity supported Standing balance-Leahy Scale: Fair Standing balance comment: able to static stand without UEs                             Pertinent Vitals/Pain Pain Assessment Pain Assessment: 0-10 Pain Location: right knee Pain Descriptors / Indicators: Operative site guarding, Sore, Burning, Tender Pain Intervention(s): Limited activity within patient's tolerance, Monitored during session, Premedicated before session, Repositioned    Home Living                          Prior Function  Hand Dominance        Extremity/Trunk Assessment                Communication      Cognition Arousal/Alertness:  Awake/alert Behavior During Therapy: WFL for tasks assessed/performed Overall Cognitive Status: Within Functional Limits for tasks assessed                                          General Comments      Exercises Total Joint Exercises Quad Sets: AROM, Both, 10 reps Heel Slides: AAROM, Both, 10 reps Long Arc Quad: AROM, Right, 10 reps   Assessment/Plan    PT Assessment    PT Problem List         PT Treatment Interventions      PT Goals (Current goals can be found in the Care Plan section)  Acute Rehab PT Goals Patient Stated Goal: to walk better PT Goal Formulation: With patient Time For Goal Achievement: 07/24/22 Potential to Achieve Goals: Good    Frequency 7X/week     Co-evaluation               AM-PAC PT "6 Clicks" Mobility  Outcome Measure Help needed turning from your back to your side while in a flat bed without using bedrails?: None Help needed moving from lying on your back to sitting on the side of a flat bed without using bedrails?: None Help needed moving to and from a bed to a chair (including a wheelchair)?: A Little Help needed standing up from a chair using your arms (e.g., wheelchair or bedside chair)?: A Little Help needed to walk in hospital room?: A Little Help needed climbing 3-5 steps with a railing? : A Little 6 Click Score: 20    End of Session Equipment Utilized During Treatment: Gait belt Activity Tolerance: Patient tolerated treatment well Patient left: with call bell/phone within reach;in bed;with bed alarm set Nurse Communication: Mobility status PT Visit Diagnosis: Pain;Difficulty in walking, not elsewhere classified (R26.2) Pain - Right/Left: Right Pain - part of body: Knee    Time: 0938-1829 PT Time Calculation (min) (ACUTE ONLY): 18 min   Charges:     PT Treatments $Gait Training: 8-22 mins        Verner Mould, DPT Acute Rehabilitation Services Office [phone] Pager  [phone]  07/24/22 11:11 AM

## 2022-07-24 NOTE — Progress Notes (Signed)
    Subjective:  Patient reports pain as mild to moderate.  Denies N/V/CP/SOB/Abd pain. She reports passing gas. She was able to tolerate solid foods yesterday. She has not had any episodes of N/V. She states she is feeling much better.   She states she did have to take an oxycodone last night due to pain. We discussed trying to limit narcotic use at home due to bowels. She is very ready to go home today.   Objective:   VITALS:   Vitals:   07/23/22 1211 07/23/22 1310 07/23/22 2118 07/24/22 0458  BP:  139/79 (!) 145/87 (!) 151/82  Pulse:  89 95 89  Resp: (!) '21 18 17 15  '$ Temp:  98.3 F (36.8 C) 98.9 F (37.2 C) 98 F (36.7 C)  TempSrc:  Oral Oral Oral  SpO2:  100% 95% 95%  Weight:      Height:        Patient ambulating back from the bathroom this morning. NAD. Neurologically intact ABD soft Neurovascular intact Sensation intact distally Intact pulses distally Dorsiflexion/Plantar flexion intact Incision: dressing C/D/I No cellulitis present Compartment soft   Lab Results  Component Value Date   WBC 5.5 07/23/2022   HGB 8.7 (L) 07/23/2022   HCT 27.2 (L) 07/23/2022   MCV 98.2 07/23/2022   PLT 247 07/23/2022   BMET    Component Value Date/Time   NA 139 07/23/2022 0347   K 3.6 07/23/2022 0347   CL 112 (H) 07/23/2022 0347   CO2 22 07/23/2022 0347   GLUCOSE 97 07/23/2022 0347   BUN 8 07/23/2022 0347   CREATININE 0.68 07/23/2022 0347   CALCIUM 8.3 (L) 07/23/2022 0347   GFRNONAA >60 07/23/2022 0347     Assessment/Plan: 7 Days Post-Op   Principal Problem:   Osteoarthritis of right knee Active Problems:   Charcot-Marie-Tooth disease of neuronal type   Asthma   GERD (gastroesophageal reflux disease)   Cutaneous lupus erythematosus   Hepatic lesion   Hypoxia   Hypoglycemia   Hypokalemia  Hemoglobin stable at 8.7.  Potassium 3.6  SPO2 97 on RA. Glucose 97 Mild ileus resolved.   WBAT with walker DVT ppx: Aspirin, SCDs, TEDS PO pain control PT/OT:  She ambulated with PT 90 feet yesterday. She has been cleared.  Dispo: She is tolerating a regular diet. She denies any N/V since advancing diet yesterday. D/c home today. She has been cleared by PT.    Charlott Rakes, PA-C 07/24/2022, 7:54 AM   [facility]  [facility] [address], [address], [place], [place] [address]

## 2022-07-27 NOTE — Discharge Summary (Signed)
Physician Discharge Summary  Patient ID: Marie Calhoun MRN: [id number] DOB/AGE: 1951-02-03 70 y.o.  Admit date: 07/17/2022 Discharge date: 07/24/2022  Admission Diagnoses:  Osteoarthritis of right knee  Discharge Diagnoses:  Principal Problem:   Osteoarthritis of right knee Active Problems:   Charcot-Marie-Tooth disease of neuronal type   Asthma   GERD (gastroesophageal reflux disease)   Cutaneous lupus erythematosus   Hepatic lesion   Hypoxia   Hypoglycemia   Hypokalemia   Past Medical History:  Diagnosis Date   Anxiety    Arthritis    Asthma 05/28/2017   Cancer ([place])    skin cancer   Charcot-Marie-Tooth disease    Depression    Environmental allergies 05/28/2017   Esophageal stricture 05/28/2017   Essential hypertension, benign 05/28/2017   GERD (gastroesophageal reflux disease) 05/28/2017   Hypercholesteremia    Lupus ([place])    of skin   PONV (postoperative nausea and vomiting)    Seasonal allergies     Surgeries: Procedure(s): COMPUTER ASSISTED TOTAL KNEE ARTHROPLASTY on 07/17/2022   Consultants (if any):   Discharged Condition: Improved  Hospital Course: Marie Calhoun is an 71 y.o. female who was admitted 07/17/2022 with a diagnosis of Osteoarthritis of right knee and went to the operating room on 07/17/2022 and underwent the above named procedures.    She was given perioperative antibiotics:  Anti-infectives (From admission, onward)    Start     Dose/Rate Route Frequency Ordered Stop   07/17/22 1330  ceFAZolin (ANCEF) IVPB 2g/100 mL premix        2 g 200 mL/hr over 30 Minutes Intravenous Every 6 hours 07/17/22 1114 07/17/22 1913   07/17/22 1113  acyclovir (ZOVIRAX) tablet 400 mg  Status:  Discontinued        400 mg Oral 3 times daily PRN 07/17/22 1113 07/24/22 1651   07/17/22 0600  ceFAZolin (ANCEF) IVPB 2g/100 mL premix        2 g 200 mL/hr over 30 Minutes Intravenous On call to O.R. 07/17/22 9242 07/17/22 0739       She was given sequential  compression devices, early ambulation, and Aspirin for DVT prophylaxis.  POD#1 She ambulated 50 feet with PT but did not clear physical therapy due to unsteadiness.She had episodes of hypoglycemia and intermittent hypoxia. Rapid response team was called. Blood glucose normalized after juice was given. She was placed on 2L O2. Chest x-ray, CBC and CMP ordered. X-ray unremarkable. Potassium low at 3.1, potassium chloride ordered. Hemoglobin stable at 10.2. POD#2 Her potassium was 3.8. Hemoglobin stable at 9.9. Patient had another episode of hypoglycemia again, glucose 54 and then 84 after juice. ABG obtained w/ elevated AA gradient, CT chest was ordered to r/o PE, that came back negative for PE. It did reveal some bibasilar atelectasis.  She was encouraged to start working with incentive spirometer. Hospitalist consult placed for intermittent hypoxia and recurrent hypoglycemia episodes.  POD#3 She was having issues with nausea and vomiting and was noted to be slightly tachycardic. She was given antiemetics and pushed fluids. She was not feeling well enough to complete physical therapy.  POD#4 She had persistent nausea and emesis with eating. Concern for ileus. KUB ordered that showed mild colonic distension. Diet chanted to clear liquids. Patient was discussed with Dr. Marlou Starks. She was started on scopolamine patch to help with nausea. She was able to ambulated with PT 40 feet.  POD#5 She was doing well with her knee and therapy she ambulated 80 feet. She had hyperactive bowel sounds.  Nausea improved with scopolamine patch and no episodes of emesis. Clear liquids continued.  POD#6 Nausea continued to improve. She was passing gas. Advanced diet from clear liquids to regular. She was cleared by PT.  POD#7 She was tolerating solid foods without nausea. She continued to improve. Patient discharged home.    She benefited maximally from the hospital stay.  Recent vital signs:  Vitals:   07/23/22 2118 07/24/22  0458  BP: (!) 145/87 (!) 151/82  Pulse: 95 89  Resp: 17 15  Temp: 98.9 F (37.2 C) 98 F (36.7 C)  SpO2: 95% 95%    Recent laboratory studies:  Lab Results  Component Value Date   HGB 8.7 (L) 07/23/2022   HGB 8.4 (L) 07/22/2022   HGB 9.2 (L) 07/19/2022   Lab Results  Component Value Date   WBC 5.5 07/23/2022   PLT 247 07/23/2022   No results found for: "INR" Lab Results  Component Value Date   NA 139 07/23/2022   K 3.6 07/23/2022   CL 112 (H) 07/23/2022   CO2 22 07/23/2022   BUN 8 07/23/2022   CREATININE 0.68 07/23/2022   GLUCOSE 97 07/23/2022     Allergies as of 07/24/2022       Reactions   Doxycycline Itching, Rash   Demerol [meperidine] Nausea Only   nausea   Pantoprazole Other (See Comments)   Subacute Cutaneous Lupus of the Skin   Plaquenil [hydroxychloroquine]    Caused another Rash on top of Lupus Rash   Sulfa Antibiotics Swelling   *Childhood*   Sulfamethoxazole-trimethoprim Other (See Comments)   Childhood    Ranitidine Rash        Medication List     STOP taking these medications    methotrexate 2.5 MG tablet       TAKE these medications    acetaminophen 500 MG tablet Commonly known as: TYLENOL Take 2 tablets (1,000 mg total) by mouth every 8 (eight) hours as needed for mild pain, moderate pain or fever. What changed:  when to take this reasons to take this   acyclovir 400 MG tablet Commonly known as: ZOVIRAX Take 400 mg by mouth 3 (three) times daily as needed (for cold sores/fever blisters (takes for 7 days when needed)).   albuterol 108 (90 Base) MCG/ACT inhaler Commonly known as: VENTOLIN HFA Inhale 2 puffs into the lungs every 6 (six) hours as needed for wheezing or shortness of breath.   aspirin 81 MG chewable tablet Commonly known as: Aspirin Childrens Chew 1 tablet (81 mg total) by mouth 2 (two) times daily with a meal.   CALCIUM 600 + D PO Take 2 tablets by mouth in the morning.   cyanocobalamin 1000 MCG  tablet Commonly known as: VITAMIN B12 Take 2,000 mcg by mouth in the morning.   cycloSPORINE 0.05 % ophthalmic emulsion Commonly known as: RESTASIS Place 1 drop into both eyes 2 (two) times daily.   docusate sodium 100 MG capsule Commonly known as: Colace Take 1 capsule (100 mg total) by mouth 2 (two) times daily.   Fish Oil 1000 MG Caps Take 1,000 mg by mouth in the morning.   fluticasone 50 MCG/ACT nasal spray Commonly known as: FLONASE Place 1-2 sprays into both nostrils daily as needed for allergies.   folic acid 1 MG tablet Commonly known as: FOLVITE Take 1 mg by mouth daily.   gabapentin 300 MG capsule Commonly known as: NEURONTIN Take 300 mg by mouth in the morning, at noon, and at  bedtime.   ketoconazole 2 % cream Commonly known as: NIZORAL Apply 1 Application topically daily. Apply to nail and finger around affected area   lansoprazole 30 MG capsule Commonly known as: PREVACID TAKE 1 CAPSULE BY MOUTH ONCE DAILY ON AN EMPTY STOMACH, 30-45 MINUTES BEFORE BREAKFAST   linaclotide 72 MCG capsule Commonly known as: Linzess TAKE 1 CAPSULE EVERY DAY BEFORE BREAKFAST   losartan-hydrochlorothiazide 50-12.5 MG tablet Commonly known as: HYZAAR Take 0.5 tablets by mouth at bedtime.   meloxicam 15 MG tablet Commonly known as: MOBIC Take 1 tablet (15 mg total) by mouth daily.   methocarbamol 500 MG tablet Commonly known as: ROBAXIN Take 1 tablet (500 mg total) by mouth every 6 (six) hours as needed for muscle spasms.   mineral oil light external liquid Apply 1 Application topically daily as needed (In ears of needed).   multivitamin with minerals Tabs tablet Take 1 tablet by mouth in the morning. Woman's   ondansetron 4 MG tablet Commonly known as: Zofran Take 1 tablet (4 mg total) by mouth every 8 (eight) hours as needed for nausea or vomiting.   polyethylene glycol 17 g packet Commonly known as: MiraLax Take 17 g by mouth daily as needed for mild  constipation or moderate constipation. What changed: reasons to take this   scopolamine 1 MG/3DAYS Commonly known as: TRANSDERM-SCOP Place 1 patch (1.5 mg total) onto the skin every 3 (three) days.   senna 8.6 MG Tabs tablet Commonly known as: SENOKOT Take 2 tablets (17.2 mg total) by mouth at bedtime for 15 days.   silver sulfADIAZINE 1 % cream Commonly known as: SILVADENE Apply 1 application topically daily as needed (foot wounds/sores).   triamcinolone ointment 0.5 % Commonly known as: KENALOG Apply 1 application topically 2 (two) times daily as needed (skin itching/irritation).   triamcinolone cream 0.1 % Commonly known as: KENALOG Apply 1 Application topically 3 (three) times daily as needed for irritation or itching.       ASK your doctor about these medications    oxyCODONE 5 MG immediate release tablet Commonly known as: Roxicodone Take 1 tablet (5 mg total) by mouth every 4 (four) hours as needed for up to 7 days for severe pain. Ask about: Should I take this medication?               Discharge Care Instructions  (From admission, onward)           Start     Ordered   07/24/22 0000  Weight bearing as tolerated        07/24/22 0802   07/24/22 0000  Change dressing       Comments: Do not remove your dressing.   07/24/22 0802              WEIGHT BEARING   Weight bearing as tolerated with assist device (walker, cane, etc) as directed, use it as long as suggested by your surgeon or therapist, typically at least 4-6 weeks.   EXERCISES  Results after joint replacement surgery are often greatly improved when you follow the exercise, range of motion and muscle strengthening exercises prescribed by your doctor. Safety measures are also important to protect the joint from further injury. Any time any of these exercises cause you to have increased pain or swelling, decrease what you are doing until you are comfortable again and then slowly increase  them. If you have problems or questions, call your caregiver or physical therapist for advice.   Rehabilitation  is important following a joint replacement. After just a few days of immobilization, the muscles of the leg can become weakened and shrink (atrophy).  These exercises are designed to build up the tone and strength of the thigh and leg muscles and to improve motion. Often times heat used for twenty to thirty minutes before working out will loosen up your tissues and help with improving the range of motion but do not use heat for the first two weeks following surgery (sometimes heat can increase post-operative swelling).   These exercises can be done on a training (exercise) mat, on the floor, on a table or on a bed. Use whatever works the best and is most comfortable for you.    Use music or television while you are exercising so that the exercises are a pleasant break in your day. This will make your life better with the exercises acting as a break in your routine that you can look forward to.   Perform all exercises about fifteen times, three times per day or as directed.  You should exercise both the operative leg and the other leg as well.  Exercises include:   Quad Sets - Tighten up the muscle on the front of the thigh (Quad) and hold for 5-10 seconds.   Straight Leg Raises - With your knee straight (if you were given a brace, keep it on), lift the leg to 60 degrees, hold for 3 seconds, and slowly lower the leg.  Perform this exercise against resistance later as your leg gets stronger.  Leg Slides: Lying on your back, slowly slide your foot toward your buttocks, bending your knee up off the floor (only go as far as is comfortable). Then slowly slide your foot back down until your leg is flat on the floor again.  Angel Wings: Lying on your back spread your legs to the side as far apart as you can without causing discomfort.  Hamstring Strength:  Lying on your back, push your heel against the  floor with your leg straight by tightening up the muscles of your buttocks.  Repeat, but this time bend your knee to a comfortable angle, and push your heel against the floor.  You may put a pillow under the heel to make it more comfortable if necessary.   A rehabilitation program following joint replacement surgery can speed recovery and prevent re-injury in the future due to weakened muscles. Contact your doctor or a physical therapist for more information on knee rehabilitation.    CONSTIPATION  Constipation is defined medically as fewer than three stools per week and severe constipation as less than one stool per week.  Even if you have a regular bowel pattern at home, your normal regimen is likely to be disrupted due to multiple reasons following surgery.  Combination of anesthesia, postoperative narcotics, change in appetite and fluid intake all can affect your bowels.   YOU MUST use at least one of the following options; they are listed in order of increasing strength to get the job done.  They are all available over the counter, and you may need to use some, POSSIBLY even all of these options:    Drink plenty of fluids (prune juice may be helpful) and high fiber foods Colace 100 mg by mouth twice a day  Senokot for constipation as directed and as needed Dulcolax (bisacodyl), take with full glass of water  Miralax (polyethylene glycol) once or twice a day as needed.  If you have tried all  these things and are unable to have a bowel movement in the first 3-4 days after surgery call either your surgeon or your primary doctor.    If you experience loose stools or diarrhea, hold the medications until you stool forms back up.  If your symptoms do not get better within 1 week or if they get worse, check with your doctor.  If you experience "the worst abdominal pain ever" or develop nausea or vomiting, please contact the office immediately for further recommendations for treatment.   ITCHING:   If you experience itching with your medications, try taking only a single pain pill, or even half a pain pill at a time.  You can also use Benadryl over the counter for itching or also to help with sleep.   TED HOSE STOCKINGS:  Use stockings on both legs until for at least 2 weeks or as directed by physician office. They may be removed at night for sleeping.  MEDICATIONS:  See your medication summary on the "After Visit Summary" that nursing will review with you.  You may have some home medications which will be placed on hold until you complete the course of blood thinner medication.  It is important for you to complete the blood thinner medication as prescribed.  PRECAUTIONS:  If you experience chest pain or shortness of breath - call 911 immediately for transfer to the hospital emergency department.   If you develop a fever greater that 101 F, purulent drainage from wound, increased redness or drainage from wound, foul odor from the wound/dressing, or calf pain - CONTACT YOUR SURGEON.                                                   FOLLOW-UP APPOINTMENTS:  If you do not already have a post-op appointment, please call the office for an appointment to be seen by your surgeon.  Guidelines for how soon to be seen are listed in your "After Visit Summary", but are typically between 1-4 weeks after surgery.  OTHER INSTRUCTIONS:   Knee Replacement:  Do not place pillow under knee, focus on keeping the knee straight while resting. CPM instructions: 0-90 degrees, 2 hours in the morning, 2 hours in the afternoon, and 2 hours in the evening. Place foam block, curve side up under heel at all times except when in CPM or when walking.  DO NOT modify, tear, cut, or change the foam block in any way.   MAKE SURE YOU:  Understand these instructions.  Get help right away if you are not doing well or get worse.    Thank you for letting us be a part of your medical care team.  It is a privilege we respect  greatly.  We hope these instructions will help you stay on track for a fast and full recovery!   Diagnostic Studies: DG Abd Portable 1V  Result Date: 07/21/2022 CLINICAL DATA:  Nausea and diarrhea.  Status post knee surgery. EXAM: PORTABLE ABDOMEN - 1 VIEW COMPARISON:  None Available. FINDINGS: Cholecystectomy clips noted in the right upper quadrant of the abdomen. Mild gaseous distension of the colon is identified which may reflect underlying postoperative colonic ileus. No pathologic dilatation of the large or small bowel loops. Previous right hip arthroplasty. IMPRESSION: Mild gaseous distension of the colon which may reflect postoperative colonic ileus. No pathologic  dilatation of the large or small bowel loops. Electronically Signed   By: Kerby Moors M.D.   On: 07/21/2022 09:45   US Abdomen Limited RUQ (LIVER/GB)  Result Date: 07/19/2022 CLINICAL DATA:  Low-density liver lesion seen on chest CTA earlier today EXAM: ULTRASOUND ABDOMEN LIMITED RIGHT UPPER QUADRANT COMPARISON:  Chest CTA earlier today FINDINGS: Gallbladder: Surgically absent. Common bile duct: Diameter: 4 mm Liver: 2.4 cm anechoic well-defined homogeneous lesion in the left liver shows enhanced through transmission. Imaging features compatible with a simple cyst. Lesion is stable since chest CT of 08/31/2021 where it measured water density. Liver parenchyma shows diffusely increased echogenicity suggesting fatty deposition. Portal vein is patent on color Doppler imaging with normal direction of blood flow towards the liver. Other: None. IMPRESSION: 2.4 cm simple cyst in the left liver, stable since 08/31/2021. No biliary dilatation. Hepatic steatosis. Electronically Signed   By: Misty Stanley M.D.   On: 07/19/2022 13:40   CT Angio Chest Pulmonary Embolism (PE) W or WO Contrast  Result Date: 07/19/2022 CLINICAL DATA:  Evaluate for pulmonary embolus. EXAM: CT ANGIOGRAPHY CHEST WITH CONTRAST TECHNIQUE: Multidetector CT imaging of the  chest was performed using the standard protocol during bolus administration of intravenous contrast. Multiplanar CT image reconstructions and MIPs were obtained to evaluate the vascular anatomy. RADIATION DOSE REDUCTION: This exam was performed according to the departmental dose-optimization program which includes automated exposure control, adjustment of the mA and/or kV according to patient size and/or use of iterative reconstruction technique. CONTRAST:  88m OMNIPAQUE IOHEXOL 350 MG/ML SOLN COMPARISON:  Chest CT 08/31/2021. FINDINGS: Cardiovascular: Heart is mildly enlarged. Trace fluid superior pericardial recess. Thoracic aortic vascular calcifications. Motion artifact limits evaluation. No intraluminal filling defects identified to suggest acute pulmonary embolus. Mediastinum/Nodes: No enlarged axillary, mediastinal or hilar lymphadenopathy. Normal appearance of the esophagus. Lungs/Pleura: Central airways are patent. Bandlike opacities and associated consolidation within the lower lobes bilaterally, favored to represent atelectasis. No pleural effusion or pneumothorax. Upper Abdomen: Liver is diffusely low in attenuation compatible with steatosis. There is a 2.1 cm low-attenuation lesion within the left hepatic lobe (image 59; series 4). Incompletely visualized cyst superior pole right kidney measuring up to 6.7 cm. Musculoskeletal: No aggressive or acute appearing osseous lesions. Review of the MIP images confirms the above findings. IMPRESSION: No evidence for acute pulmonary embolus. Indeterminate low-attenuation lesion within the left hepatic lobe. This may potentially represent a cyst however solid mass is not entirely excluded. Consider initial evaluation with right upper quadrant ultrasound. If this is not confirmed as a cyst on ultrasound, MRI would be recommended. Bilateral lower lobe bandlike consolidation favored to represent atelectasis/scarring. Electronically Signed   By: DLovey NewcomerM.D.   On:  07/19/2022 10:32   DG CHEST PORT 1 VIEW  Result Date: 07/18/2022 CLINICAL DATA:  Shortness of breath. EXAM: PORTABLE CHEST 1 VIEW COMPARISON:  July 26, 2021 FINDINGS: The heart size and mediastinal contours are within normal limits. Low lung volumes are noted. There is no evidence of an acute infiltrate, pleural effusion or pneumothorax. The visualized skeletal structures are unremarkable. IMPRESSION: Low lung volumes without acute or active cardiopulmonary disease. Electronically Signed   By: TVirgina NorfolkM.D.   On: 07/18/2022 19:25   DG Knee Right Port  Result Date: 07/17/2022 CLINICAL DATA:  Right knee arthroplasty EXAM: PORTABLE RIGHT KNEE - 1-2 VIEW COMPARISON:  None Available. FINDINGS: Frontal and cross-table lateral views of the right knee are obtained. Three component right knee arthroplasty is identified in the  expected position without signs of acute complication. Postsurgical changes are seen within the soft tissues. Anterior skin staples are noted. IMPRESSION: 1. Unremarkable right knee arthroplasty. Electronically Signed   By: Randa Ngo M.D.   On: 07/17/2022 10:40    Disposition: Discharge disposition: 01-Home or Self Care       Discharge Instructions     Call MD / Call 911   Complete by: As directed    If you experience chest pain or shortness of breath, CALL 911 and be transported to the hospital emergency room.  If you develope a fever above 101 F, pus (white drainage) or increased drainage or redness at the wound, or calf pain, call your surgeon's office.   Change dressing   Complete by: As directed    Do not remove your dressing.   Constipation Prevention   Complete by: As directed    Drink plenty of fluids.  Prune juice may be helpful.  You may use a stool softener, such as Colace (over the counter) 100 mg twice a day.  Use MiraLax (over the counter) for constipation as needed.   Diet - low sodium heart healthy   Complete by: As directed    Discharge  instructions   Complete by: As directed    Elevate toes above nose. Use cryotherapy as needed for pain and swelling.   Do not put a pillow under the knee. Place it under the heel.   Complete by: As directed    Driving restrictions   Complete by: As directed    No driving for 6 weeks   Increase activity slowly as tolerated   Complete by: As directed    Lifting restrictions   Complete by: As directed    No lifting for 6 weeks   Post-operative opioid taper instructions:   Complete by: As directed    POST-OPERATIVE OPIOID TAPER INSTRUCTIONS: It is important to wean off of your opioid medication as soon as possible. If you do not need pain medication after your surgery it is ok to stop day one. Opioids include: Codeine, Hydrocodone(Norco, Vicodin), Oxycodone(Percocet, oxycontin) and hydromorphone amongst others.  Long term and even short term use of opiods can cause: Increased pain response Dependence Constipation Depression Respiratory depression And more.  Withdrawal symptoms can include Flu like symptoms Nausea, vomiting And more Techniques to manage these symptoms Hydrate well Eat regular healthy meals Stay active Use relaxation techniques(deep breathing, meditating, yoga) Do Not substitute Alcohol to help with tapering If you have been on opioids for less than two weeks and do not have pain than it is ok to stop all together.  Plan to wean off of opioids This plan should start within one week post op of your joint replacement. Maintain the same interval or time between taking each dose and first decrease the dose.  Cut the total daily intake of opioids by one tablet each day Next start to increase the time between doses. The last dose that should be eliminated is the evening dose.      TED hose   Complete by: As directed    Use stockings (TED hose) for 2 weeks on both leg(s).  You may remove them at night for sleeping.   Weight bearing as tolerated   Complete by: As  directed         Follow-up Information     Swinteck, Aaron Edelman, MD Follow up in 2 week(s).   Specialty: Orthopedic Surgery Why: For wound re-check Contact information: [address]  [address] [place] [address] [phone]                  Signed: Charlott Rakes, PA-C 07/29/2022, 12:42 PM

## 2022-08-10 ENCOUNTER — Other Ambulatory Visit (INDEPENDENT_AMBULATORY_CARE_PROVIDER_SITE_OTHER): Payer: Self-pay | Admitting: Internal Medicine

## 2022-08-12 ENCOUNTER — Other Ambulatory Visit (INDEPENDENT_AMBULATORY_CARE_PROVIDER_SITE_OTHER): Payer: Self-pay | Admitting: *Deleted

## 2022-08-12 MED ORDER — LANSOPRAZOLE 30 MG PO CPDR: DELAYED_RELEASE_CAPSULE | ORAL | 2 refills | Status: DC

## 2022-09-07 ENCOUNTER — Other Ambulatory Visit: Payer: Self-pay | Admitting: Pulmonary Disease

## 2022-10-24 DIAGNOSIS — M1990 Unspecified osteoarthritis, unspecified site: Secondary | ICD-10-CM | POA: Insufficient documentation

## 2022-10-24 DIAGNOSIS — I011 Acute rheumatic endocarditis: Secondary | ICD-10-CM | POA: Insufficient documentation

## 2022-10-24 DIAGNOSIS — M3500 Sicca syndrome, unspecified: Secondary | ICD-10-CM | POA: Insufficient documentation

## 2022-11-05 ENCOUNTER — Ambulatory Visit (INDEPENDENT_AMBULATORY_CARE_PROVIDER_SITE_OTHER): Payer: Medicare Other | Admitting: Pulmonary Disease

## 2022-11-05 ENCOUNTER — Encounter: Payer: Self-pay | Admitting: Pulmonary Disease

## 2022-11-05 VITALS — BP 138/88 | HR 70 | Temp 98.2°F | Ht 68.0 in | Wt 165.8 lb

## 2022-11-05 DIAGNOSIS — J4520 Mild intermittent asthma, uncomplicated: Secondary | ICD-10-CM | POA: Diagnosis not present

## 2022-11-05 DIAGNOSIS — J849 Interstitial pulmonary disease, unspecified: Secondary | ICD-10-CM

## 2022-11-05 NOTE — Assessment & Plan Note (Signed)
Appears to have mild intermittent symptoms.  Will use albuterol as needed. If she has increasing symptoms we will consider using albuterol/steroid combination

## 2022-11-05 NOTE — Progress Notes (Signed)
   Subjective:    Patient ID: Marie Calhoun, female    DOB: [birth year], 71 y.o.   MRN: [id number]  HPI  71 yo never smoker for FU of dyspnea on exertion since May 2022   She was followed in [place] by Dr. Farris Has for many years for asthma and allergies, she quit taking allergy shots in 2018 when she went on methotrexate for subcutaneous lupus and since then has only been on Nasonex.     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    Chief Complaint  Patient presents with   Follow-up    Recently diagnosed with sjogrens syndrome and rheumatoid arthritis and   wanted Dr. Elsworth Soho to be aware    She underwent right hip replacement and right knee replacement 6 weeks apart and seems to have recovered from this.  She saw rheumatology and was diagnosed with both rheumatoid arthritis and Sjogren's disease in addition to lupus.  Methotrexate has been increased to 10 mg every week and prednisone 10 mg/day was added for 3 months in December. She brings in her lab reports which are reviewed, positive RA factor, positive SSA/SSB, negative CCP  Breathing is stable she needs albuterol intermittently. We reviewed previous CT  Significant tests/ events reviewed HRCT chest 08/2021 Minimal, bland appearing scarring at the dependent bilateral lung bases.  Review of Systems neg for any significant sore throat, dysphagia, itching, sneezing, nasal congestion or excess/ purulent secretions, fever, chills, sweats, unintended wt loss, pleuritic or exertional cp, hempoptysis, orthopnea pnd or change in chronic leg swelling. Also denies presyncope, palpitations, heartburn, abdominal pain, nausea, vomiting, diarrhea or change in bowel or urinary habits, dysuria,hematuria, rash, arthralgias, visual complaints,  headache, numbness weakness or ataxia.     Objective:   Physical Exam  Gen. Pleasant, well-nourished, in no distress ENT - no thrush, no pallor/icterus,no post nasal drip Neck: No JVD, no thyromegaly, no carotid bruits Lungs: no use of accessory muscles, no dullness to percussion, clear without rales or rhonchi  Cardiovascular: Rhythm regular, heart sounds  normal, no murmurs or gallops, no peripheral edema Musculoskeletal: No deformities, no cyanosis or clubbing         Assessment & Plan:   ILD -previous CT suggested bland scarring rather than true ILD.  We will obtain 1 year follow-up high-resolution CT to look for disease progression given her diagnosis of lupus and rheumatoid arthritis,looking for CT ILD here

## 2022-11-05 NOTE — Patient Instructions (Signed)
  X High res CT chest for ILD

## 2022-12-19 ENCOUNTER — Other Ambulatory Visit (HOSPITAL_COMMUNITY): Payer: Medicare Other

## 2022-12-25 IMAGING — US US RENAL
1 series · 14 of 25 positions shown · non-contrast
Comparison: None Available.

CLINICAL DATA: Renal cyst on MRI 2222 at [REDACTED].
Hypertension.

EXAM:
RENAL / URINARY TRACT ULTRASOUND COMPLETE

[Series 1: us renal · 14 of 124 slices shown]
[im 1/124]
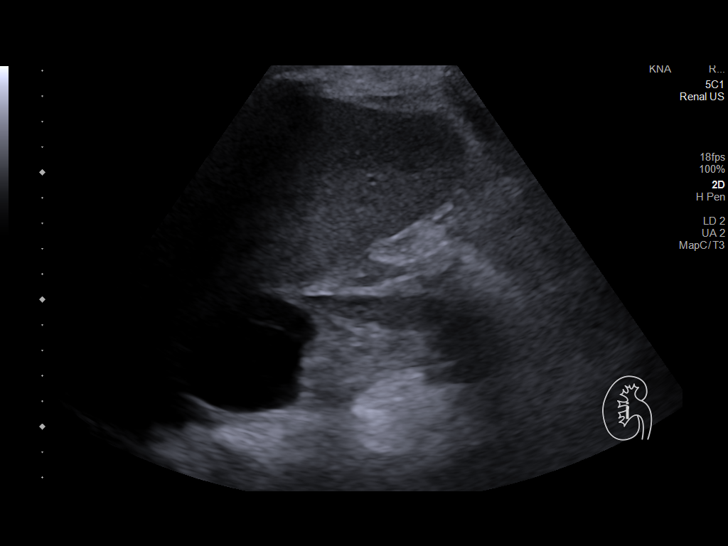
[im 11/124]
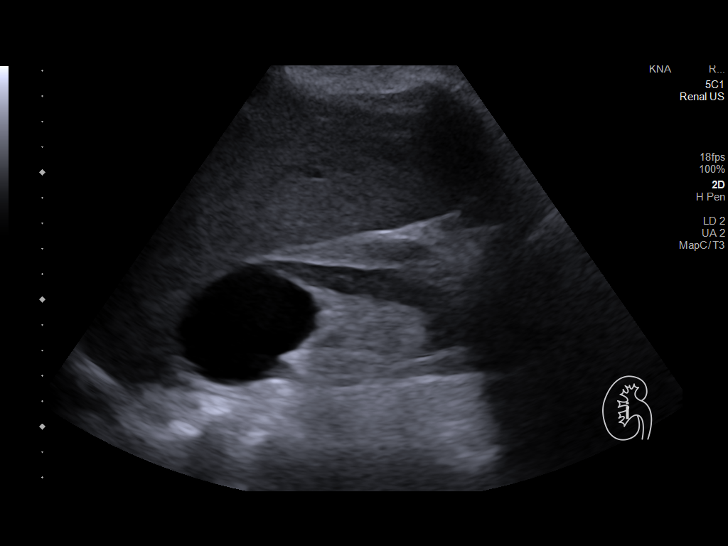
[im 21/124]
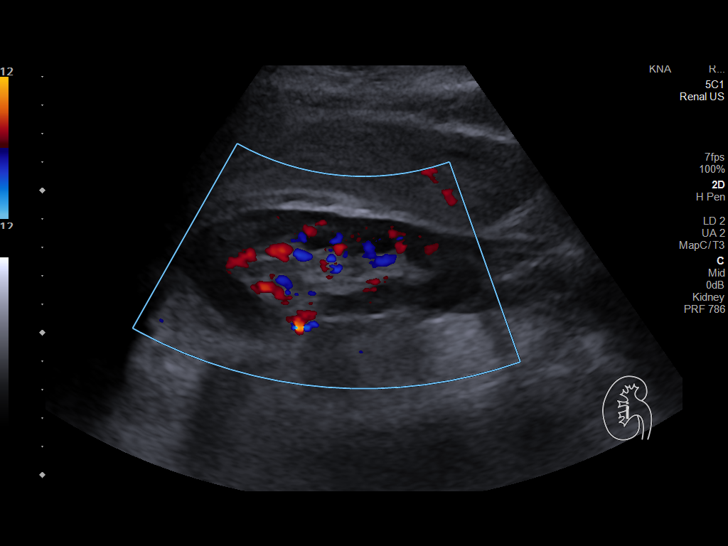
[im 31/124]
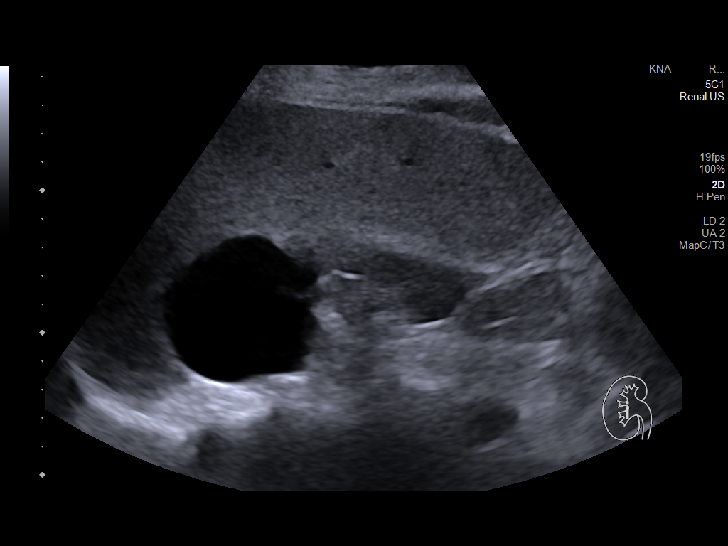
[im 42/124]
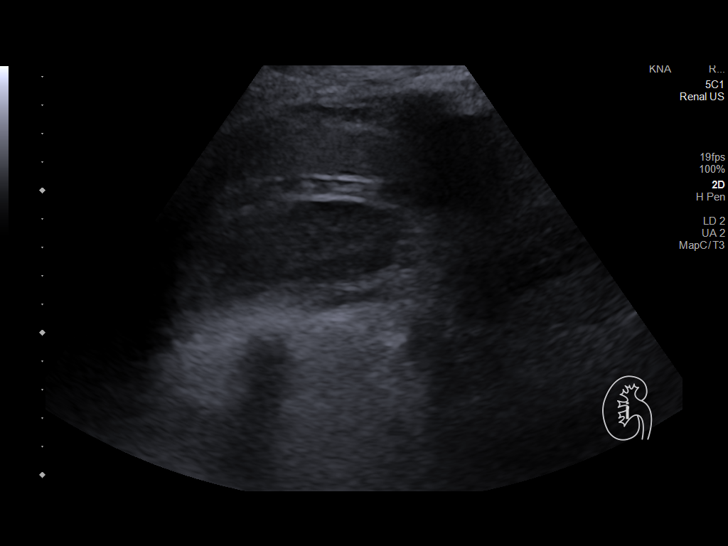
[im 47/124]
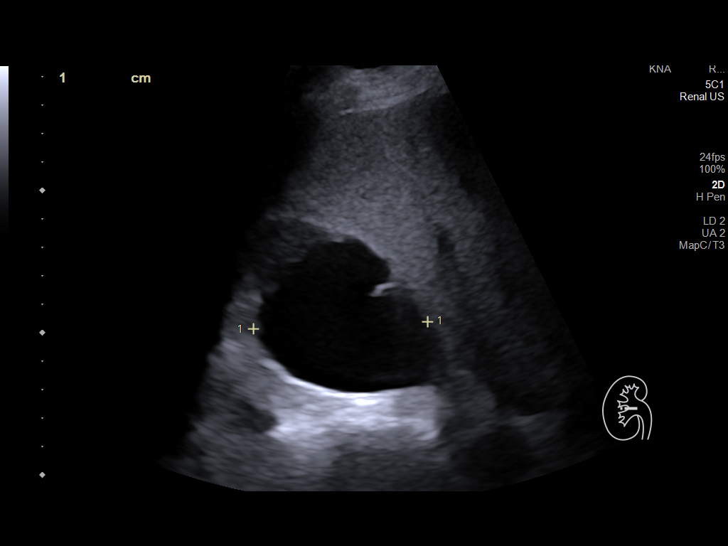
[im 57/124]
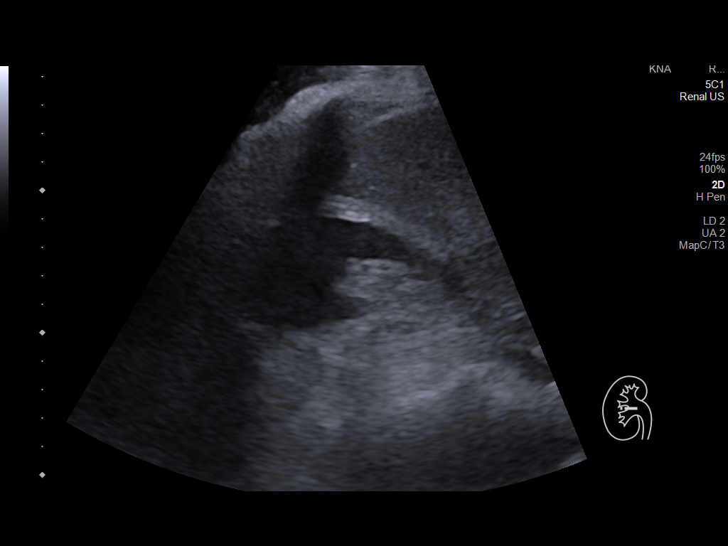
[im 67/124]
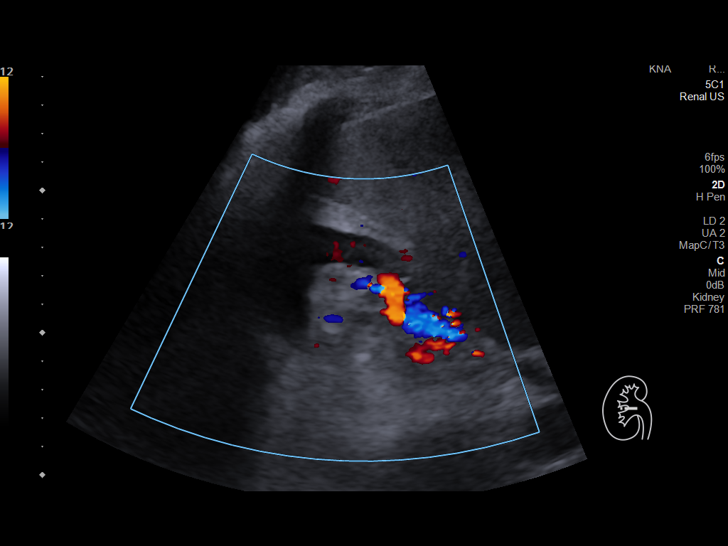
[im 77/124]
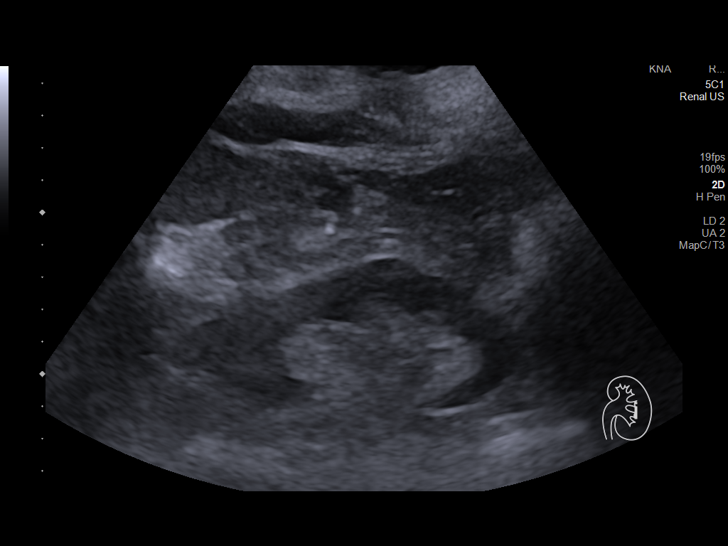
[im 83/124]
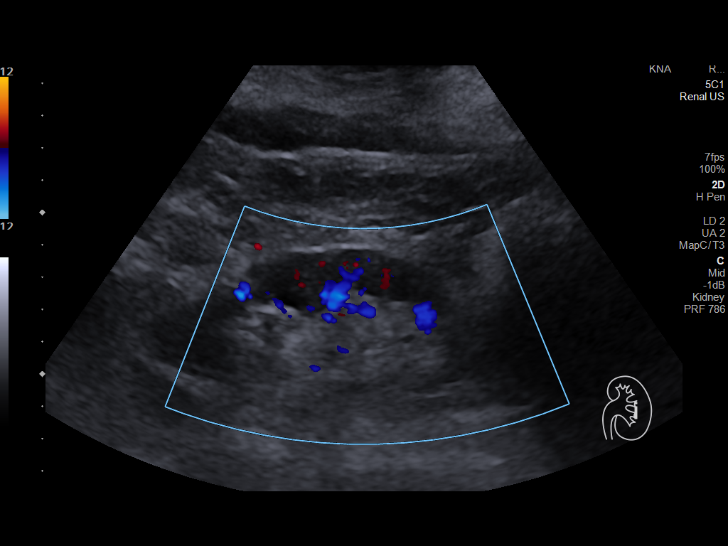
[im 93/124]
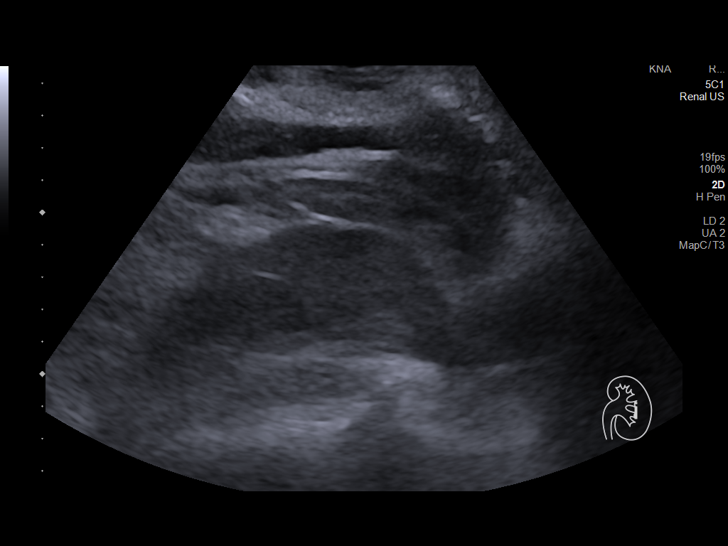
[im 103/124]
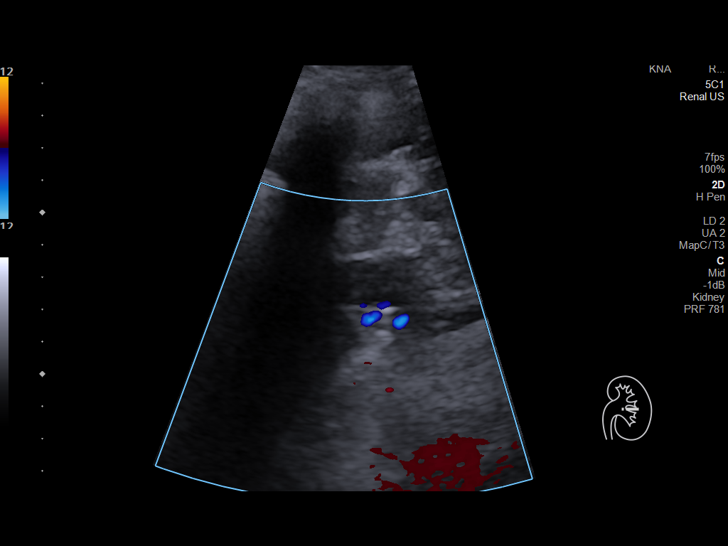
[im 113/124]
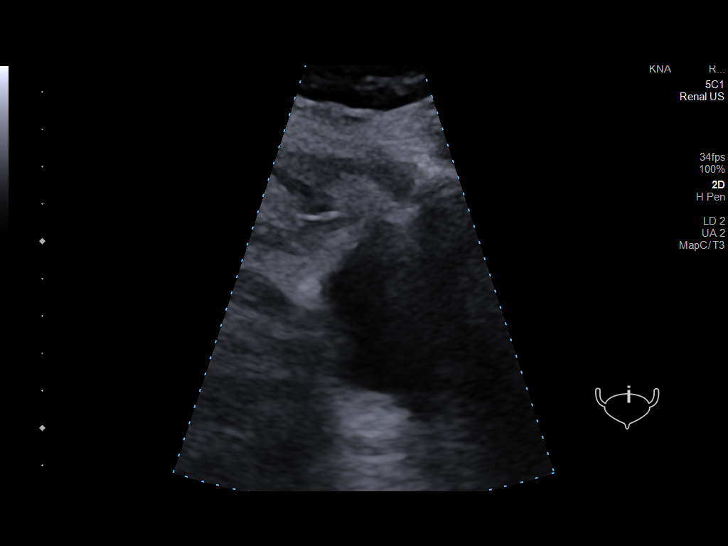
[im 124/124]
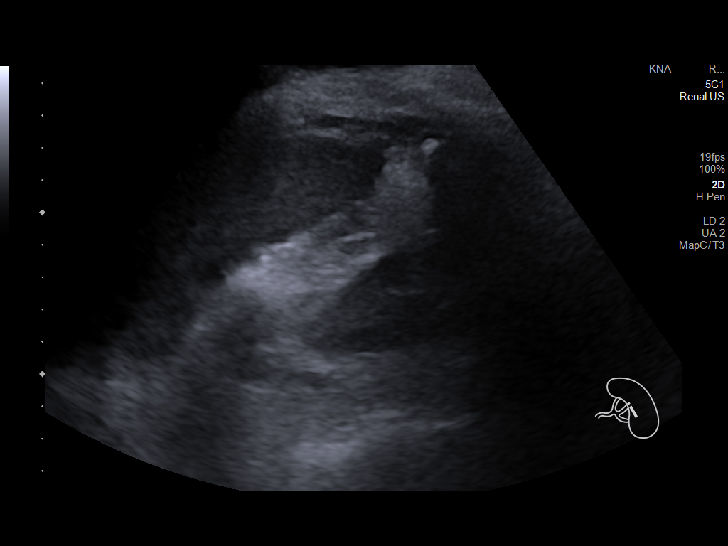

[14 of 25 positions shown; findings below may reference images not displayed]

FINDINGS: Right Kidney:

Renal measurements: 10.9 x 4.3 x 5.3 cm = volume: 130 mL.
Echogenicity within normal limits. Avascular anechoic cystic lesion
measuring 6 x 5.1 x 4.1 cm along the superior pole of the right
kidney. Query associated nonvascular mural nodularity on image 48 of
127. No solid mass or hydronephrosis visualized.

Left Kidney:

Renal measurements: 10.3 x 5.2 x 4.8 cm = volume: 135 mL.
Echogenicity within normal limits. No mass or hydronephrosis
visualized.

Urinary bladder:

Appears normal for degree of bladder distention.

Other:

None.
IMPRESSION: A minimally complex 6 cm right renal cyst. Recommend correlation
with prior cross-sectional imaging to evaluate for stability in
size.

## 2023-02-05 ENCOUNTER — Other Ambulatory Visit (INDEPENDENT_AMBULATORY_CARE_PROVIDER_SITE_OTHER): Payer: Self-pay | Admitting: Internal Medicine

## 2023-02-06 ENCOUNTER — Encounter (INDEPENDENT_AMBULATORY_CARE_PROVIDER_SITE_OTHER): Payer: Self-pay

## 2023-02-19 ENCOUNTER — Encounter (INDEPENDENT_AMBULATORY_CARE_PROVIDER_SITE_OTHER): Payer: Self-pay | Admitting: Gastroenterology

## 2023-04-17 ENCOUNTER — Ambulatory Visit (INDEPENDENT_AMBULATORY_CARE_PROVIDER_SITE_OTHER): Payer: Medicare Other | Admitting: Gastroenterology

## 2023-04-30 ENCOUNTER — Ambulatory Visit (HOSPITAL_COMMUNITY)
Admission: RE | Admit: 2023-04-30 | Discharge: 2023-04-30 | Disposition: A | Discharge status: Home / Self Care | Payer: Medicare Other | Source: Ambulatory Visit | Attending: Pulmonary Disease | Admitting: Pulmonary Disease

## 2023-04-30 ENCOUNTER — Ambulatory Visit (HOSPITAL_COMMUNITY)
Admission: RE | Admit: 2023-04-30 | Discharge: 2023-04-30 | Disposition: A | Discharge status: Home / Self Care | Payer: Medicare Other | Source: Ambulatory Visit | Attending: Urology | Admitting: Urology

## 2023-04-30 DIAGNOSIS — N2889 Other specified disorders of kidney and ureter: Secondary | ICD-10-CM | POA: Insufficient documentation

## 2023-04-30 DIAGNOSIS — I2510 Atherosclerotic heart disease of native coronary artery without angina pectoris: Secondary | ICD-10-CM | POA: Insufficient documentation

## 2023-04-30 DIAGNOSIS — I3481 Nonrheumatic mitral (valve) annulus calcification: Secondary | ICD-10-CM | POA: Diagnosis not present

## 2023-04-30 DIAGNOSIS — N281 Cyst of kidney, acquired: Secondary | ICD-10-CM | POA: Diagnosis not present

## 2023-04-30 DIAGNOSIS — I700 Atherosclerosis of aorta: Secondary | ICD-10-CM | POA: Insufficient documentation

## 2023-04-30 DIAGNOSIS — J849 Interstitial pulmonary disease, unspecified: Secondary | ICD-10-CM | POA: Insufficient documentation

## 2023-05-03 ENCOUNTER — Other Ambulatory Visit (INDEPENDENT_AMBULATORY_CARE_PROVIDER_SITE_OTHER): Payer: Self-pay | Admitting: Gastroenterology

## 2023-05-07 ENCOUNTER — Ambulatory Visit (INDEPENDENT_AMBULATORY_CARE_PROVIDER_SITE_OTHER): Payer: Medicare Other | Admitting: Urology

## 2023-05-07 VITALS — BP 141/82 | HR 71 | Ht 68.0 in | Wt 180.0 lb

## 2023-05-07 DIAGNOSIS — N3281 Overactive bladder: Secondary | ICD-10-CM

## 2023-05-07 DIAGNOSIS — N2889 Other specified disorders of kidney and ureter: Secondary | ICD-10-CM

## 2023-05-07 LAB — URINALYSIS, ROUTINE W REFLEX MICROSCOPIC
Bilirubin, UA: NEGATIVE
Glucose, UA: NEGATIVE
Ketones, UA: NEGATIVE
Leukocytes,UA: NEGATIVE
Nitrite, UA: NEGATIVE
Protein,UA: NEGATIVE
RBC, UA: NEGATIVE
Specific Gravity, UA: 1.015 (ref 1.005–1.030)
Urobilinogen, Ur: 0.2 mg/dL (ref 0.2–1.0)
pH, UA: 5.5 (ref 5.0–7.5)

## 2023-05-07 MED ORDER — MIRABEGRON ER 25 MG PO TB24: 25.0000 mg | ORAL_TABLET | Freq: Every day | ORAL | 0 refills | Status: DC

## 2023-05-07 NOTE — Progress Notes (Signed)
05/07/2023 10:06 AM   Brien Mates 02/03/1951 [id number]  Referring provider: Alinda Deem, MD [address] [place],  [place] [address]  Followup renal cyst   HPI: [name] is a 21JY here for for followup for a right renal cyst and with new complaint of urge incontinence. Renal US shows stable simple 6.9cm renal cyst. She has urinary urgency and frequency. She uses 2-3 pads per day. She has nocturia 1-2x.    PMH: Past Medical History:  Diagnosis Date   Anxiety    Arthritis    Asthma 05/28/2017   Cancer (HCC)    skin cancer   Charcot-Marie-Tooth disease    Depression    Environmental allergies 05/28/2017   Esophageal stricture 05/28/2017   Essential hypertension, benign 05/28/2017   GERD (gastroesophageal reflux disease) 05/28/2017   Hypercholesteremia    Lupus (HCC)    of skin   PONV (postoperative nausea and vomiting)    Seasonal allergies     Surgical History: Past Surgical History:  Procedure Laterality Date   breast tumor\     left benign   CHOLECYSTECTOMY     COLONOSCOPY WITH PROPOFOL N/A 05/30/2021   Procedure: COLONOSCOPY WITH PROPOFOL;  Surgeon: Malissa Hippo, MD;  [facility]: [facility];  Service: Endoscopy;  Laterality: N/A;  855   ESOPHAGEAL DILATION N/A 09/11/2017   Procedure: ESOPHAGEAL DILATION;  Surgeon: Malissa Hippo, MD;  Location: [facility];  Service: Endoscopy;  Laterality: N/A;   ESOPHAGEAL DILATION N/A 06/17/2018   Procedure: ESOPHAGEAL DILATION;  Surgeon: Malissa Hippo, MD;  [facility]: [facility];  Service: Endoscopy;  Laterality: N/A;   ESOPHAGOGASTRODUODENOSCOPY N/A 09/11/2017   Procedure: ESOPHAGOGASTRODUODENOSCOPY (EGD);  Surgeon: Malissa Hippo, MD;  [facility]: [facility];  Service: Endoscopy;  Laterality: N/A;  1:40 - Can NOT come earlier   ESOPHAGOGASTRODUODENOSCOPY N/A 06/17/2018   Procedure: ESOPHAGOGASTRODUODENOSCOPY (EGD);  Surgeon: Malissa Hippo, MD;  [facility]: [facility];  Service: Endoscopy;   Laterality: N/A;  10:30   hand tendon release Left    KNEE ARTHROPLASTY Right 07/17/2022   Procedure: COMPUTER ASSISTED TOTAL KNEE ARTHROPLASTY;  Surgeon: Samson Frederic, MD;  Location: [facility];  Service: Orthopedics;  Laterality: Right;  150   knee surgeries     1980   LUMBAR EPIDURAL INJECTION     PARTIAL HYSTERECTOMY     1995   POLYPECTOMY  05/30/2021   Procedure: POLYPECTOMY;  Surgeon: Malissa Hippo, MD;  Location: [facility];  Service: Endoscopy;;   TOTAL HIP ARTHROPLASTY Right 05/30/2022   Procedure: TOTAL HIP ARTHROPLASTY ANTERIOR APPROACH;  Surgeon: Samson Frederic, MD;  Location: [facility];  Service: Orthopedics;  Laterality: Right;    Home Medications:  Allergies as of 05/07/2023       Reactions   Doxycycline Itching, Rash   Demerol [meperidine] Nausea Only   nausea   Pantoprazole Other (See Comments)   Subacute Cutaneous Lupus of the Skin   Plaquenil [hydroxychloroquine]    Caused another Rash on top of Lupus Rash   Sulfa Antibiotics Swelling   *Childhood*   Sulfamethoxazole-trimethoprim Other (See Comments)   Childhood    Ranitidine Rash        Medication List        Accurate as of May 07, 2023 10:06 AM. If you have any questions, ask your nurse or doctor.          acetaminophen 500 MG tablet Commonly known as: TYLENOL Take 2 tablets (1,000 mg total) by mouth  every 8 (eight) hours as needed for mild pain, moderate pain or fever.   acyclovir 400 MG tablet Commonly known as: ZOVIRAX Take 400 mg by mouth 3 (three) times daily as needed (for cold sores/fever blisters (takes for 7 days when needed)).   albuterol 108 (90 Base) MCG/ACT inhaler Commonly known as: VENTOLIN HFA Inhale 2 puffs into the lungs every 6 (six) hours as needed for wheezing or shortness of breath.   CALCIUM 600 + D PO Take 2 tablets by mouth in the morning.   cyanocobalamin 1000 MCG tablet Commonly known as: VITAMIN B12 Take 2,000 mcg by mouth in the morning.   cycloSPORINE  0.05 % ophthalmic emulsion Commonly known as: RESTASIS Place 1 drop into both eyes 2 (two) times daily.   Fish Oil 1000 MG Caps Take 1,000 mg by mouth in the morning.   fluticasone 50 MCG/ACT nasal spray Commonly known as: FLONASE USE 1 TO 2 SPRAYS IN EACH NOSTRIL EVERY DAY AS NEEDED FOR ALLERGIES.   folic acid 1 MG tablet Commonly known as: FOLVITE Take 1 mg by mouth daily.   gabapentin 300 MG capsule Commonly known as: NEURONTIN Take 300 mg by mouth in the morning, at noon, and at bedtime.   ketoconazole 2 % cream Commonly known as: NIZORAL Apply 1 Application topically daily. Apply to nail and finger around affected area   lansoprazole 30 MG capsule Commonly known as: PREVACID TAKE 1 CAPSULE BY MOUTH ONCE DAILY ON AN EMPTY STOMACH 30-45 MINUTES BEFORE BREAKFAST   Linzess 72 MCG capsule Generic drug: linaclotide TAKE 1 CAPSULE EVERY DAY BEFORE BREAKFAST   losartan-hydrochlorothiazide 50-12.5 MG tablet Commonly known as: HYZAAR Take 0.5 tablets by mouth at bedtime.   methotrexate 2.5 MG tablet Commonly known as: RHEUMATREX Take 10 mg by mouth once a week.   mineral oil light external liquid Apply 1 Application topically daily as needed (In ears of needed).   multivitamin with minerals Tabs tablet Take 1 tablet by mouth in the morning. Woman's   ondansetron 4 MG tablet Commonly known as: Zofran Take 1 tablet (4 mg total) by mouth every 8 (eight) hours as needed for nausea or vomiting.   silver sulfADIAZINE 1 % cream Commonly known as: SILVADENE Apply 1 application topically daily as needed (foot wounds/sores).   triamcinolone cream 0.1 % Commonly known as: KENALOG Apply 1 Application topically 3 (three) times daily as needed for irritation or itching.        Allergies:  Allergies  Allergen Reactions   Doxycycline Itching and Rash   Demerol [Meperidine] Nausea Only    nausea   Pantoprazole Other (See Comments)    Subacute Cutaneous Lupus of the Skin    Plaquenil [Hydroxychloroquine]     Caused another Rash on top of Lupus Rash   Sulfa Antibiotics Swelling    *Childhood*   Sulfamethoxazole-Trimethoprim Other (See Comments)    Childhood    Ranitidine Rash    Family History: No family history on file.  Social History:  reports that she has never smoked. She has never been exposed to tobacco smoke. She has never used smokeless tobacco. She reports that she does not drink alcohol and does not use drugs.  ROS: All other review of systems were reviewed and are negative except what is noted above in HPI  Physical Exam: BP (!) 141/82   Pulse 71   Ht 5\' 8"  (1.727 m)   Wt 180 lb (81.6 kg)   BMI 27.37 kg/m   Constitutional:  Alert and oriented, No acute distress. HEENT: [place] AT, moist mucus membranes.  Trachea midline, no masses. Cardiovascular: No clubbing, cyanosis, or edema. Respiratory: Normal respiratory effort, no increased work of breathing. GI: Abdomen is soft, nontender, nondistended, no abdominal masses GU: No CVA tenderness.  Lymph: No cervical or inguinal lymphadenopathy. Skin: No rashes, bruises or suspicious lesions. Neurologic: Grossly intact, no focal deficits, moving all 4 extremities. Psychiatric: Normal mood and affect.  Laboratory Data: Lab Results  Component Value Date   WBC 5.5 07/23/2022   HGB 8.7 (L) 07/23/2022   HCT 27.2 (L) 07/23/2022   MCV 98.2 07/23/2022   PLT 247 07/23/2022    Lab Results  Component Value Date   CREATININE 0.68 07/23/2022    No results found for: "PSA"  No results found for: "TESTOSTERONE"  Lab Results  Component Value Date   HGBA1C 4.7 (L) 07/20/2022    Urinalysis    Component Value Date/Time   COLORURINE YELLOW 07/19/2022 1826   APPEARANCEUR CLEAR 07/19/2022 1826   APPEARANCEUR Clear 05/08/2022 1322   LABSPEC 1.026 07/19/2022 1826   PHURINE 5.0 07/19/2022 1826   GLUCOSEU NEGATIVE 07/19/2022 1826   HGBUR NEGATIVE 07/19/2022 1826   BILIRUBINUR NEGATIVE  07/19/2022 1826   BILIRUBINUR Negative 05/08/2022 1322   KETONESUR NEGATIVE 07/19/2022 1826   PROTEINUR NEGATIVE 07/19/2022 1826   NITRITE NEGATIVE 07/19/2022 1826   LEUKOCYTESUR NEGATIVE 07/19/2022 1826    Lab Results  Component Value Date   LABMICR Comment 05/08/2022   BACTERIA NONE SEEN 07/19/2022    Pertinent Imaging: Renal US 04/30/2023: Images reviewed and discussed with the patient  No results found for this or any previous visit.  No results found for this or any previous visit.  No results found for this or any previous visit.  No results found for this or any previous visit.  Results for orders placed during the hospital encounter of 04/30/23  Ultrasound renal complete  Narrative CLINICAL DATA:  Follow-up right renal cyst  EXAM: RENAL / URINARY TRACT ULTRASOUND COMPLETE  COMPARISON:  Abdominal ultrasound July 19, 2022  FINDINGS: Right Kidney:  Renal measurements: 12.9 x 4.6 x 4.9 cm = volume: 152 mL. There is a cyst with a single thin septation in the right kidney measuring 6.9 cm. No follow-up imaging recommended for the cyst.  Left Kidney:  Renal measurements: 10.9 x 4.8 x 4.4 cm = volume: 122 mL. Echogenicity within normal limits. No mass or hydronephrosis visualized.  Bladder:  Appears normal for degree of bladder distention.  Other:  None.  IMPRESSION: 1. The cyst in the right kidney is a benign cyst with a single thin septation. No follow-up imaging recommended. 2. No other abnormalities.   Electronically Signed By: Gerome Sam III M.D. On: 04/30/2023 12:23  No valid procedures specified. No results found for this or any previous visit.  No results found for this or any previous visit.   Assessment & Plan:    1. Kidney mass Followup 1 year with a renal US - Urinalysis, Routine w reflex microscopic  2. OAB -we will trial mirabegron 25mg  daily -followup 4 weeks    No follow-ups on file.  Wilkie Aye,  MD  [facility] [place]

## 2023-05-15 ENCOUNTER — Encounter: Payer: Self-pay | Admitting: Urology

## 2023-05-15 NOTE — Patient Instructions (Signed)

## 2023-06-10 ENCOUNTER — Encounter (INDEPENDENT_AMBULATORY_CARE_PROVIDER_SITE_OTHER): Payer: Self-pay | Admitting: Gastroenterology

## 2023-06-10 ENCOUNTER — Ambulatory Visit (INDEPENDENT_AMBULATORY_CARE_PROVIDER_SITE_OTHER): Payer: Medicare Other | Admitting: Gastroenterology

## 2023-06-10 ENCOUNTER — Telehealth: Payer: Self-pay | Admitting: *Deleted

## 2023-06-10 VITALS — BP 132/84 | HR 79 | Temp 97.5°F | Ht 68.0 in | Wt 185.3 lb

## 2023-06-10 DIAGNOSIS — K5909 Other constipation: Secondary | ICD-10-CM

## 2023-06-10 DIAGNOSIS — K2100 Gastro-esophageal reflux disease with esophagitis, without bleeding: Secondary | ICD-10-CM | POA: Diagnosis not present

## 2023-06-10 MED ORDER — LANSOPRAZOLE 30 MG PO CPDR: DELAYED_RELEASE_CAPSULE | ORAL | 3 refills | Status: DC

## 2023-06-10 NOTE — Progress Notes (Signed)
Sent message, via epic in basket, requesting orders in epic from surgeon.  

## 2023-06-10 NOTE — Patient Instructions (Signed)
Please continue with lansoprazole 30mg  daily, I have sent a refill of this Be mindful of greasy, spicy, fried, citrus/tomato based foods,caffeine, carbonated drinks, chocolate and alcohol as these can increase reflux symptoms Stay upright 2-3 hours after eating, prior to lying down and avoid eating late in the evenings.  For your constipation, you can try doubling up you linzess for the next 7-10 days and let me know how this works for you, I can send a prescription for the dosing if you have good results. Increase water intake, aim for atleast 64 oz per day Increase fruits, veggies and whole grains, kiwi and prunes are especially good for constipation For gas, you can try over the counter gas x or phazyme   Follow up 1 year  It was a pleasure to see you today. I want to create trusting relationships with patients and provide genuine, compassionate, and quality care. I truly value your feedback! please be on the lookout for a survey regarding your visit with me today. I appreciate your input about our visit and your time in completing this!    Kirsty Monjaraz L. Jeanmarie Hubert, MSN, APRN, AGNP-C Adult-Gerontology Nurse Practitioner [facility]

## 2023-06-10 NOTE — Progress Notes (Signed)
Referring Provider: Alinda Deem, MD Primary Care Physician:  Alinda Deem, MD Primary GI Physician: Previously Dr. Karilyn Cota (Dr. Tasia Catchings)  Chief Complaint  Patient presents with   Follow-up    Patient here today for a follow up.  Patient does want to inquire about increasing the Linzess from 72 mcg to a higher dose as she is having some occasional issues with constipation. Marie Calhoun is controlled with the lansoprazole 30 mg once per day.   HPI:   Marie Calhoun is a 72 y.o. female with past medical history of anxiety, arthritis, asthma, skin cancer, CMT disease, depression, HTN, GERD, high cholesterol, lupus.   Patient presenting today for follow up of constipation and GERD  Last seen may 2023, at that time GERD well controlled, taking famotidine on PRN basis in the evening, some bloating at times. Linzess working well for constipation.   Recommended to continue with linzess , continue lansoprazole 30mg  daily.  Present:  States GERD is well controlled on lansoprazole 30mg  daily. Denies breakthrough symptoms, no dysphagia or odynophagia.   She states she is taking linzess daily. She is taking a stool softener with her linzess as she did not feel that linzess alone was working. Reports she is having a BM maybe every 3 days. She notes stools are harder and having to strain some when she defecates. She notes more flatulence at times as well. She notes water intake average is about three 16 oz bottles per day, drinks a lot of decaf tea. Diet is high in fruits and does salads often as well. Denies changes in appetite or weight loss.  No rectal bleeding or melena.   Notes she was recently diagnosed with Sjogren's syndrome and OA.   She is currently off methotrexate for her lupus as she has to be off of this prior to upcoming left knee surgery in August.   Last Colonoscopy:2022   - Two 4 to 7 mm polyps in the rectum and in the                            sigmoid colon, removed with a  cold snare. Resected                            and retrieved. (Hyperplastic polyp)  Last Endoscopy:2019  Recommendations:  Repeat colonoscopy in 10 years   Past Medical History:  Diagnosis Date   Anxiety    Arthritis    Asthma 05/28/2017   Cancer (HCC)    skin cancer   Charcot-Marie-Tooth disease    Depression    Environmental allergies 05/28/2017   Esophageal stricture 05/28/2017   Essential hypertension, benign 05/28/2017   GERD (gastroesophageal reflux disease) 05/28/2017   Hypercholesteremia    Lupus (HCC)    of skin   PONV (postoperative nausea and vomiting)    Seasonal allergies     Past Surgical History:  Procedure Laterality Date   breast tumor\     left benign   CHOLECYSTECTOMY     COLONOSCOPY WITH PROPOFOL N/A 05/30/2021   Procedure: COLONOSCOPY WITH PROPOFOL;  Surgeon: Malissa Hippo, MD;  [facility];  Service: Endoscopy;  Laterality: N/A;  855   ESOPHAGEAL DILATION N/A 09/11/2017   Procedure: ESOPHAGEAL DILATION;  Surgeon: Malissa Hippo, MD;  [facility];  Service: Endoscopy;  Laterality: N/A;   ESOPHAGEAL DILATION N/A 06/17/2018   Procedure: ESOPHAGEAL DILATION;  Surgeon: Malissa Hippo, MD;  [facility];  Service: Endoscopy;  Laterality: N/A;   ESOPHAGOGASTRODUODENOSCOPY N/A 09/11/2017   Procedure: ESOPHAGOGASTRODUODENOSCOPY (EGD);  Surgeon: Malissa Hippo, MD;  [facility];  Service: Endoscopy;  Laterality: N/A;  1:40 - Can NOT come earlier   ESOPHAGOGASTRODUODENOSCOPY N/A 06/17/2018   Procedure: ESOPHAGOGASTRODUODENOSCOPY (EGD);  Surgeon: Malissa Hippo, MD;  [facility];  Service: Endoscopy;  Laterality: N/A;  10:30   hand tendon release Left    KNEE ARTHROPLASTY Right 07/17/2022   Procedure: COMPUTER ASSISTED TOTAL KNEE ARTHROPLASTY;  Surgeon: Samson Frederic, MD;  Location: [facility];  Service: Orthopedics;  Laterality: Right;  150   knee surgeries     1980   LUMBAR EPIDURAL  INJECTION     PARTIAL HYSTERECTOMY     1995   POLYPECTOMY  05/30/2021   Procedure: POLYPECTOMY;  Surgeon: Malissa Hippo, MD;  [facility];  Service: Endoscopy;;   TOTAL HIP ARTHROPLASTY Right 05/30/2022   Procedure: TOTAL HIP ARTHROPLASTY ANTERIOR APPROACH;  Surgeon: Samson Frederic, MD;  Location: [facility];  Service: Orthopedics;  Laterality: Right;    Current Outpatient Medications  Medication Sig Dispense Refill   acetaminophen (TYLENOL) 500 MG tablet Take 2 tablets (1,000 mg total) by mouth every 8 (eight) hours as needed for mild pain, moderate pain or fever. 30 tablet 0   acyclovir (ZOVIRAX) 400 MG tablet Take 400 mg by mouth 3 (three) times daily as needed (for cold sores/fever blisters (takes for 7 days when needed)).     albuterol (VENTOLIN HFA) 108 (90 Base) MCG/ACT inhaler Inhale 2 puffs into the lungs every 6 (six) hours as needed for wheezing or shortness of breath. 18 g 3   Calcium Carb-Cholecalciferol (CALCIUM 600 + D PO) Take 2 tablets by mouth in the morning.     cycloSPORINE (RESTASIS) 0.05 % ophthalmic emulsion Place 1 drop into both eyes 2 (two) times daily.     fluticasone (FLONASE) 50 MCG/ACT nasal spray USE 1 TO 2 SPRAYS IN EACH NOSTRIL EVERY DAY AS NEEDED FOR ALLERGIES. 16 g 10   folic acid (FOLVITE) 1 MG tablet Take 1 mg by mouth daily.     gabapentin (NEURONTIN) 300 MG capsule Take 300 mg by mouth in the morning, at noon, and at bedtime.     ketoconazole (NIZORAL) 2 % cream Apply 1 Application topically daily. Apply to nail and finger around affected area     lansoprazole (PREVACID) 30 MG capsule TAKE 1 CAPSULE BY MOUTH ONCE DAILY ON AN EMPTY STOMACH 30-45 MINUTES BEFORE BREAKFAST 30 capsule 0   LINZESS 72 MCG capsule TAKE 1 CAPSULE EVERY DAY BEFORE BREAKFAST 90 capsule 3   losartan-hydrochlorothiazide (HYZAAR) 50-12.5 MG tablet Take 0.5 tablets by mouth at bedtime.     methotrexate (RHEUMATREX) 2.5 MG tablet Take 10 mg by mouth once a week.     mineral  oil light external liquid Apply 1 Application topically daily as needed (In ears of needed).     mirabegron ER (MYRBETRIQ) 25 MG TB24 tablet Take 1 tablet (25 mg total) by mouth daily. 30 tablet 0   Multiple Vitamin (MULTIVITAMIN WITH MINERALS) TABS tablet Take 1 tablet by mouth in the morning. Woman's     Omega-3 Fatty Acids (FISH OIL) 1000 MG CAPS Take 1,000 mg by mouth in the morning.     ondansetron (ZOFRAN) 4 MG tablet Take 1 tablet (4 mg total) by mouth every 8 (eight) hours as needed  for nausea or vomiting. 30 tablet 0   silver sulfADIAZINE (SILVADENE) 1 % cream Apply 1 application topically daily as needed (foot wounds/sores).     triamcinolone cream (KENALOG) 0.1 % Apply 1 Application topically 3 (three) times daily as needed for irritation or itching.     vitamin B-12 (CYANOCOBALAMIN) 1000 MCG tablet Take 2,500 mcg by mouth daily.     No current facility-administered medications for this visit.    Allergies as of 06/10/2023 - Review Complete 06/10/2023  Allergen Reaction Noted   Doxycycline Itching and Rash 10/30/2018   Demerol [meperidine] Nausea Only 05/28/2017   Pantoprazole Other (See Comments) 06/15/2018   Plaquenil [hydroxychloroquine]  06/15/2018   Sulfa antibiotics Swelling 05/28/2017   Sulfamethoxazole-trimethoprim Other (See Comments) 03/12/2022   Ranitidine Rash 10/30/2018    History reviewed. No pertinent family history.  Social History   Socioeconomic History   Marital status: Single    Spouse name: Not on file   Number of children: Not on file   Years of education: Not on file   Highest education level: Not on file  Occupational History   Not on file  Tobacco Use   Smoking status: Never    Passive exposure: Never   Smokeless tobacco: Never  Vaping Use   Vaping status: Never Used  Substance and Sexual Activity   Alcohol use: No   Drug use: No   Sexual activity: Not on file  Other Topics Concern   Not on file  Social History Narrative   Not on  file   Social Determinants of Health   Financial Resource Strain: Not on file  Food Insecurity: No Food Insecurity (07/23/2022)   Hunger Vital Sign    Worried About Running Out of Food in the Last Year: Never true    Ran Out of Food in the Last Year: Never true  Transportation Needs: No Transportation Needs (07/23/2022)   PRAPARE - Administrator, Civil Service (Medical): No    Lack of Transportation (Non-Medical): No  Physical Activity: Not on file  Stress: Not on file  Social Connections: Not on file    Review of systems General: negative for malaise, night sweats, fever, chills, weight loss Neck: Negative for lumps, goiter, pain and significant neck swelling Resp: Negative for cough, wheezing, dyspnea at rest CV: Negative for chest pain, leg swelling, palpitations, orthopnea GI: denies melena, hematochezia, nausea, vomiting, diarrhea, dysphagia, odyonophagia, early satiety or unintentional weight loss. +constipation +flatulence  MSK: Negative for joint pain or swelling, back pain, and muscle pain. Derm: Negative for itching or rash Psych: Denies depression, anxiety, memory loss, confusion. No homicidal or suicidal ideation.  Heme: Negative for prolonged bleeding, bruising easily, and swollen nodes. Endocrine: Negative for cold or heat intolerance, polyuria, polydipsia and goiter. Neuro: negative for tremor, gait imbalance, syncope and seizures. The remainder of the review of systems is noncontributory.  Physical Exam: BP 132/84 (BP Location: Left Arm, Patient Position: Sitting, Cuff Size: Large)   Pulse 79   Temp (!) 97.5 F (36.4 C) (Temporal)   Ht 5\' 8"  (1.727 m)   Wt 185 lb 4.8 oz (84.1 kg)   BMI 28.17 kg/m  General:   Alert and oriented. No distress noted. Pleasant and cooperative.  Head:  Normocephalic and atraumatic. Eyes:  Conjuctiva clear without scleral icterus. Mouth:  Oral mucosa pink and moist. Good dentition. No lesions. Heart: Normal rate and  rhythm, s1 and s2 heart sounds present.  Lungs: Clear lung sounds in all lobes.  Respirations equal and unlabored. Abdomen:  +BS, soft, non-tender and non-distended. No rebound or guarding. No HSM or masses noted. Derm: No palmar erythema or jaundice Msk:  Symmetrical without gross deformities. Normal posture. Extremities:  Without edema. Neurologic:  Alert and  oriented x4 Psych:  Alert and cooperative. Normal mood and affect.  Invalid input(s): "6 MONTHS"   ASSESSMENT: Marie Calhoun is a 72 y.o. female presenting today for follow up of GERD and constipation  GERD: well controlled on lansoprazole 30mg  daily. No dysphagia or odynophagia. Will continue with current PPI regimen, good reflux precautions to include being mindful of greasy, spicy, fried, citrus foods, caffeine, carbonated drinks, chocolate and alcohol as these can increase reflux symptoms Stay upright 2-3 hours after eating, prior to lying down and avoid eating late in the evenings.  Constipation: she has been on linzess daily for some time, recently having less frequent, harder stools, having to add in a stool softener. As she has plenty of linzess on hand at home, can try doubling up her dose and see how she does with this, if this provides good results, I will send an Rx for Linzess daily. If she continues to have refractory constipation, may need to consider switching to another agent such as IBsrela. Recommend she try over the counter gas x or phazyme as she is having more gas recently as well.    PLAN:  Increase linzess, take two tablets per day (can send Rx for linzess daily if good results) 2. Increase water intake, aim for atleast 64 oz per day Increase fruits, veggies and whole grains, kiwi and prunes are especially good for constipation 3. Continue lansoprazole 30mg  daily, good reflux precautions 4. Can use Gas x or phazyme otc  All questions were answered, patient verbalized  understanding and is in agreement with plan as outlined above.    Follow Up: 1 year   Marie Calhoun L. Jeanmarie Hubert, MSN, APRN, AGNP-C Adult-Gerontology Nurse Practitioner [facility]

## 2023-06-10 NOTE — Telephone Encounter (Signed)
Patient states that she received a bill for an incorrect code for CT chest and medicare will not pay for it. Given patient number to billing but she did not seem to get the answers she needed. She also spoke to someone at [facility] and there seems to be a coding problem.  Patient is unaware who to reach out to next.  Please call and advise patient [phone]

## 2023-06-10 NOTE — Progress Notes (Unsigned)
Name: Marie Calhoun DOB: 1951-05-19 MRN: [id number]  History of Present Illness: Marie Calhoun is a 72 y.o. female who presents today for follow up visit at [facility] [place]. - GU history: 1. OAB with urinary frequency, nocturia (1-2x/night), urgency, and urge incontinence.  2. Right renal cyst. - 04/30/2023: Renal US showed stable simple 6.9 cm renal cyst.   At last visit with Dr. Ronne Binning on 05/07/2023: - Reported using 2-3 pads per day.  - The plan was:  1. Start Myrbetriq 25 mg daily. 2. Follow up in 1 year with RUS.  Today: She reports symptomatic improvement since start Myrbetriq 25 mg daily. She reports decreased urinary frequency, urgency, nocturia (down to 1x/night), and urge incontinence. She denies dysuria, gross hematuria, straining to void, or sensations of incomplete emptying.   Medications: Current Outpatient Medications  Medication Sig Dispense Refill   acetaminophen (TYLENOL) 500 MG tablet Take 2 tablets (1,000 mg total) by mouth every 8 (eight) hours as needed for mild pain, moderate pain or fever. 30 tablet 0   acyclovir (ZOVIRAX) 400 MG tablet Take 400 mg by mouth 3 (three) times daily as needed (for cold sores/fever blisters (takes for 7 days when needed)).     albuterol (VENTOLIN HFA) 108 (90 Base) MCG/ACT inhaler Inhale 2 puffs into the lungs every 6 (six) hours as needed for wheezing or shortness of breath. 18 g 3   Calcium Carb-Cholecalciferol (CALCIUM 600 + D PO) Take 2 tablets by mouth in the morning.     cycloSPORINE (RESTASIS) 0.05 % ophthalmic emulsion Place 1 drop into both eyes 2 (two) times daily.     fluticasone (FLONASE) 50 MCG/ACT nasal spray USE 1 TO 2 SPRAYS IN EACH NOSTRIL EVERY DAY AS NEEDED FOR ALLERGIES. 16 g 10   folic acid (FOLVITE) 1 MG tablet Take 1 mg by mouth daily.     gabapentin (NEURONTIN) 300 MG capsule Take 300 mg by mouth in the morning, at noon, and at bedtime.     ketoconazole (NIZORAL) 2 % cream Apply 1 Application  topically daily. Apply to nail and finger around affected area     lansoprazole (PREVACID) 30 MG capsule TAKE 1 CAPSULE BY MOUTH ONCE DAILY ON AN EMPTY STOMACH 30-45 MINUTES BEFORE BREAKFAST 90 capsule 3   LINZESS 72 MCG capsule TAKE 1 CAPSULE EVERY DAY BEFORE BREAKFAST (Patient taking differently: Take 144 mcg by mouth daily before breakfast.) 90 capsule 3   losartan-hydrochlorothiazide (HYZAAR) 50-12.5 MG tablet Take 0.5 tablets by mouth at bedtime.     methotrexate (RHEUMATREX) 2.5 MG tablet Take 10 mg by mouth once a week.     mineral oil light external liquid Apply 1 Application topically daily as needed (In ears of needed).     Multiple Vitamin (MULTIVITAMIN WITH MINERALS) TABS tablet Take 1 tablet by mouth in the morning. Woman's     Omega-3 Fatty Acids (FISH OIL) 1000 MG CAPS Take 1,000 mg by mouth in the morning.     ondansetron (ZOFRAN) 4 MG tablet Take 1 tablet (4 mg total) by mouth every 8 (eight) hours as needed for nausea or vomiting. 30 tablet 0   silver sulfADIAZINE (SILVADENE) 1 % cream Apply 1 application topically daily as needed (foot wounds/sores).     triamcinolone cream (KENALOG) 0.1 % Apply 1 Application topically 3 (three) times daily as needed for irritation or itching.     vitamin B-12 (CYANOCOBALAMIN) 1000 MCG tablet Take 2,500 mcg by mouth daily.     mirabegron ER (  MYRBETRIQ) 25 MG TB24 tablet Take 1 tablet (25 mg total) by mouth daily. 90 tablet 3   No current facility-administered medications for this visit.    Allergies: Allergies  Allergen Reactions   Doxycycline Itching and Rash   Demerol [Meperidine] Nausea Only    nausea   Pantoprazole Other (See Comments)    Subacute Cutaneous Lupus of the Skin   Plaquenil [Hydroxychloroquine]     Caused another Rash on top of Lupus Rash   Sulfa Antibiotics Swelling    *Childhood*   Sulfamethoxazole-Trimethoprim Other (See Comments)    Childhood    Ranitidine Rash    Past Medical History:  Diagnosis Date    Anxiety    Arthritis    Asthma 05/28/2017   Cancer (HCC)    skin cancer   Charcot-Marie-Tooth disease    Depression    Environmental allergies 05/28/2017   Esophageal stricture 05/28/2017   Essential hypertension, benign 05/28/2017   GERD (gastroesophageal reflux disease) 05/28/2017   Hypercholesteremia    Lupus (HCC)    of skin   PONV (postoperative nausea and vomiting)    Seasonal allergies    Past Surgical History:  Procedure Laterality Date   breast tumor\     left benign   CHOLECYSTECTOMY     COLONOSCOPY WITH PROPOFOL N/A 05/30/2021   Procedure: COLONOSCOPY WITH PROPOFOL;  Surgeon: Malissa Hippo, MD;  [facility];  Service: Endoscopy;  Laterality: N/A;  855   ESOPHAGEAL DILATION N/A 09/11/2017   Procedure: ESOPHAGEAL DILATION;  Surgeon: Malissa Hippo, MD;  [facility];  Service: Endoscopy;  Laterality: N/A;   ESOPHAGEAL DILATION N/A 06/17/2018   Procedure: ESOPHAGEAL DILATION;  Surgeon: Malissa Hippo, MD;  [facility];  Service: Endoscopy;  Laterality: N/A;   ESOPHAGOGASTRODUODENOSCOPY N/A 09/11/2017   Procedure: ESOPHAGOGASTRODUODENOSCOPY (EGD);  Surgeon: Malissa Hippo, MD;  [facility];  Service: Endoscopy;  Laterality: N/A;  1:40 - Can NOT come earlier   ESOPHAGOGASTRODUODENOSCOPY N/A 06/17/2018   Procedure: ESOPHAGOGASTRODUODENOSCOPY (EGD);  Surgeon: Malissa Hippo, MD;  [facility];  Service: Endoscopy;  Laterality: N/A;  10:30   hand tendon release Left    KNEE ARTHROPLASTY Right 07/17/2022   Procedure: COMPUTER ASSISTED TOTAL KNEE ARTHROPLASTY;  Surgeon: Samson Frederic, MD;  Location: [facility];  Service: Orthopedics;  Laterality: Right;  150   knee surgeries     1980   LUMBAR EPIDURAL INJECTION     PARTIAL HYSTERECTOMY     1995   POLYPECTOMY  05/30/2021   Procedure: POLYPECTOMY;  Surgeon: Malissa Hippo, MD;  [facility];  Service: Endoscopy;;   TOTAL HIP ARTHROPLASTY Right  05/30/2022   Procedure: TOTAL HIP ARTHROPLASTY ANTERIOR APPROACH;  Surgeon: Samson Frederic, MD;  Location: [facility];  Service: Orthopedics;  Laterality: Right;   History reviewed. No pertinent family history. Social History   Socioeconomic History   Marital status: Single    Spouse name: Not on file   Number of children: Not on file   Years of education: Not on file   Highest education level: Not on file  Occupational History   Not on file  Tobacco Use   Smoking status: Never    Passive exposure: Never   Smokeless tobacco: Never  Vaping Use   Vaping status: Never Used  Substance and Sexual Activity   Alcohol use: No   Drug use: No   Sexual activity: Not Currently  Other Topics Concern  Not on file  Social History Narrative   Not on file   Social Determinants of Health   Financial Resource Strain: Not on file  Food Insecurity: No Food Insecurity (07/23/2022)   Hunger Vital Sign    Worried About Running Out of Food in the Last Year: Never true    Ran Out of Food in the Last Year: Never true  Transportation Needs: No Transportation Needs (07/23/2022)   PRAPARE - Administrator, Civil Service (Medical): No    Lack of Transportation (Non-Medical): No  Physical Activity: Not on file  Stress: Not on file  Social Connections: Not on file  Intimate Partner Violence: Not At Risk (07/23/2022)   Humiliation, Afraid, Rape, and Kick questionnaire    Fear of Current or Ex-Partner: No    Emotionally Abused: No    Physically Abused: No    Sexually Abused: No    Review of Systems Constitutional: Patient denies any unintentional weight loss or change in strength lntegumentary: Patient denies any rashes or pruritus Cardiovascular: Patient denies chest pain or syncope Respiratory: Patient denies shortness of breath Gastrointestinal: Patient denies nausea, vomiting, or diarrhea. Reports chronic constipation. Musculoskeletal: Patient denies muscle cramps or weakness Neurologic:  Patient denies convulsions or seizures Psychiatric: Patient denies memory problems Allergic/Immunologic: Patient denies recent allergic reaction(s) Hematologic/Lymphatic: Patient denies bleeding tendencies Endocrine: Patient denies heat/cold intolerance  GU: As per HPI.  OBJECTIVE Vitals:   06/11/23 1116  BP: 123/82  Pulse: 74  Temp: 99.1 F (37.3 C)   There is no height or weight on file to calculate BMI.  Physical Examination  Constitutional: No obvious distress; patient is non-toxic appearing  Cardiovascular: No visible lower extremity edema.  Respiratory: The patient does not have audible wheezing/stridor; respirations do not appear labored  Gastrointestinal: Abdomen non-distended Musculoskeletal: Normal ROM of UEs  Skin: No obvious rashes/open sores  Neurologic: CN 2-12 grossly intact Psychiatric: Answered questions appropriately with normal affect  Hematologic/Lymphatic/Immunologic: No obvious bruises or sites of spontaneous bleeding  UA: negative  PVR: 0 ml  ASSESSMENT Urinary urgency - Plan: Urinalysis, Routine w reflex microscopic, BLADDER SCAN AMB NON-IMAGING, mirabegron ER (MYRBETRIQ) 25 MG TB24 tablet  Urinary frequency - Plan: Urinalysis, Routine w reflex microscopic, BLADDER SCAN AMB NON-IMAGING, mirabegron ER (MYRBETRIQ) 25 MG TB24 tablet  Urge incontinence - Plan: Urinalysis, Routine w reflex microscopic, BLADDER SCAN AMB NON-IMAGING, mirabegron ER (MYRBETRIQ) 25 MG TB24 tablet  OAB (overactive bladder) - Plan: Urinalysis, Routine w reflex microscopic, BLADDER SCAN AMB NON-IMAGING, mirabegron ER (MYRBETRIQ) 25 MG TB24 tablet  Renal cyst  Chronic constipation  Doing well. Agreed to continue Myrbetriq 25 mg daily. Advised minimal caffeine intake.  For constipation: Advised patient to maintain adequate daily fluid intake and consider daily fiber supplement in addition to continuing her Linzess as prescribed elsewhere.  Will plan for follow up in June 2025  with RUS for renal cyst surveillance. Pt verbalized understanding and agreement. All questions were answered.  PLAN Advised the following: 1. Continue Myrbetriq 25 mg daily. 2. Minimize caffeine intake. 3. Return in about 11 months (around 04/29/2024) for RUS around 04/29/24 and f/u appt with NP or MD afterwards.  Orders Placed This Encounter  Procedures   Urinalysis, Routine w reflex microscopic   BLADDER SCAN AMB NON-IMAGING    It has been explained that the patient is to follow regularly with their PCP in addition to all other providers involved in their care and to follow instructions provided by these respective offices. Patient advised  to contact urology clinic if any urologic-pertaining questions, concerns, new symptoms or problems arise in the interim period.  There are no Patient Instructions on file for this visit.  Electronically signed by:  Donnita Falls, FNP   06/11/23    12:12 PM

## 2023-06-11 ENCOUNTER — Encounter: Payer: Self-pay | Admitting: Urology

## 2023-06-11 ENCOUNTER — Ambulatory Visit (INDEPENDENT_AMBULATORY_CARE_PROVIDER_SITE_OTHER): Payer: Medicare Other | Admitting: Urology

## 2023-06-11 VITALS — BP 123/82 | HR 74 | Temp 99.1°F

## 2023-06-11 DIAGNOSIS — N281 Cyst of kidney, acquired: Secondary | ICD-10-CM | POA: Diagnosis not present

## 2023-06-11 DIAGNOSIS — K5909 Other constipation: Secondary | ICD-10-CM

## 2023-06-11 DIAGNOSIS — N3281 Overactive bladder: Secondary | ICD-10-CM

## 2023-06-11 DIAGNOSIS — N3941 Urge incontinence: Secondary | ICD-10-CM | POA: Diagnosis not present

## 2023-06-11 DIAGNOSIS — R350 Frequency of micturition: Secondary | ICD-10-CM

## 2023-06-11 DIAGNOSIS — R3915 Urgency of urination: Secondary | ICD-10-CM

## 2023-06-11 LAB — URINALYSIS, ROUTINE W REFLEX MICROSCOPIC
Bilirubin, UA: NEGATIVE
Glucose, UA: NEGATIVE
Ketones, UA: NEGATIVE
Leukocytes,UA: NEGATIVE
Nitrite, UA: NEGATIVE
Protein,UA: NEGATIVE
RBC, UA: NEGATIVE
Specific Gravity, UA: 1.02 (ref 1.005–1.030)
Urobilinogen, Ur: 0.2 mg/dL (ref 0.2–1.0)
pH, UA: 5.5 (ref 5.0–7.5)

## 2023-06-11 LAB — BLADDER SCAN AMB NON-IMAGING: Scan Result: 0

## 2023-06-11 MED ORDER — MIRABEGRON ER 25 MG PO TB24: 25.0000 mg | ORAL_TABLET | Freq: Every day | ORAL | 3 refills | Status: DC

## 2023-06-11 NOTE — Telephone Encounter (Signed)
Lmam for patient to call back not sure what we can do since we dont do the billing

## 2023-06-11 NOTE — Telephone Encounter (Signed)
Patient is calling because she said Medicare said there was a problem with the coding on her bill.  Patient would like a call back at [phone].  Patient has an appt. And will be available after 1pm today.

## 2023-06-11 NOTE — Telephone Encounter (Signed)
In original message, patient did reach out to billing and [facility]. Marisue Ivan, is this something you can assist with? Please advise on next steps.

## 2023-06-11 NOTE — Telephone Encounter (Signed)
Patient will need to contact the billing office for billing issues.

## 2023-06-12 ENCOUNTER — Ambulatory Visit: Payer: Self-pay | Admitting: Student

## 2023-06-13 NOTE — Progress Notes (Signed)
Anesthesia Review:  PCP: Cardiologist : Pulm- DR alva- LOV 11/05/22  Neuro- Waldon Merl Gable LOV 09/13/22  Chest x-ray : EKG : Echo : Stress test: Cardiac Cath :  Activity level:  Sleep Study/ CPAP : Fasting Blood Sugar :      / Checks Blood Sugar -- times a day:   Blood Thinner/ Instructions /Last Dose: ASA / Instructions/ Last Dose :

## 2023-06-13 NOTE — Patient Instructions (Signed)
SURGICAL WAITING ROOM VISITATION  Patients having surgery or a procedure may have no more than 2 support people in the waiting area - these visitors may rotate.    Children under the age of 64 must have an adult with them who is not the patient.  Due to an increase in RSV and influenza rates and associated hospitalizations, children ages 56 and under may not visit patients in [facility] hospitals.  If the patient needs to stay at the hospital during part of their recovery, the visitor guidelines for inpatient rooms apply. Pre-op nurse will coordinate an appropriate time for 1 support person to accompany patient in pre-op.  This support person may not rotate.    Please refer to the [facility] website for the visitor guidelines for Inpatients (after your surgery is over and you are in a regular room).       Your procedure is scheduled on:  06/25/2023    Report to [facility] Main Entrance    Report to admitting at  0900 AM   Call this number if you have problems the morning of surgery [phone]   Do not eat food :After Midnight.   After Midnight you may have the following liquids until _ 0830_____ AM DAY OF SURGERY  Water Non-Citrus Juices (without pulp, NO RED-Apple, White grape, White cranberry) Black Coffee (NO MILK/CREAM OR CREAMERS, sugar ok)  Clear Tea (NO MILK/CREAM OR CREAMERS, sugar ok) regular and decaf                             Plain Jell-O (NO RED)                                           Fruit ices (not with fruit pulp, NO RED)                                     Popsicles (NO RED)                                                               Sports drinks like Gatorade (NO RED)                   The day of surgery:  Drink ONE (1) Pre-Surgery Clear Ensure or G2 at  0830AM  ( have completed by ) the morning of surgery. Drink in one sitting. Do not sip.  This drink was given to you during your hospital  pre-op appointment visit. Nothing else to drink  after completing the  Pre-Surgery Clear Ensure or G2.          If you have questions, please contact your surgeon's office.       Oral Hygiene is also important to reduce your risk of infection.                                    Remember - BRUSH YOUR TEETH THE MORNING OF SURGERY WITH YOUR REGULAR  TOOTHPASTE  DENTURES WILL BE REMOVED PRIOR TO SURGERY PLEASE DO NOT APPLY "Poly grip" OR ADHESIVES!!!   Do NOT smoke after Midnight   Stop all vitamins and herbal supplements 7 days before surgery.    Take these medicines the morning of surgery with A SIP OF WATER:  inhalers as usual and bring, flonase if needed, gabapentgin, prevacid, myrbetriq   DO NOT TAKE ANY ORAL DIABETIC MEDICATIONS DAY OF YOUR SURGERY  Bring CPAP mask and tubing day of surgery.                              You may not have any metal on your body including hair pins, jewelry, and body piercing             Do not wear make-up, lotions, powders, perfumes/cologne, or deodorant  Do not wear nail polish including gel and S&S, artificial/acrylic nails, or any other type of covering on natural nails including finger and toenails. If you have artificial nails, gel coating, etc. that needs to be removed by a nail salon please have this removed prior to surgery or surgery may need to be canceled/ delayed if the surgeon/ anesthesia feels like they are unable to be safely monitored.   Do not shave  48 hours prior to surgery.               Men may shave face and neck.   Do not bring valuables to the hospital. [facility] IS NOT             RESPONSIBLE   FOR VALUABLES.   Contacts, glasses, dentures or bridgework may not be worn into surgery.   Bring small overnight bag day of surgery.   DO NOT BRING YOUR HOME MEDICATIONS TO THE HOSPITAL. PHARMACY WILL DISPENSE MEDICATIONS LISTED ON YOUR MEDICATION LIST TO YOU DURING YOUR ADMISSION IN THE HOSPITAL!    Patients discharged on the day of surgery will not be allowed to drive  home.  Someone NEEDS to stay with you for the first 24 hours after anesthesia.   Special Instructions: Bring a copy of your healthcare power of attorney and living will documents the day of surgery if you haven't scanned them before.              Please read over the following fact sheets you were given: IF YOU HAVE QUESTIONS ABOUT YOUR PRE-OP INSTRUCTIONS PLEASE CALL [phone]   If you received a COVID test during your pre-op visit  it is requested that you wear a mask when out in public, stay away from anyone that may not be feeling well and notify your surgeon if you develop symptoms. If you test positive for Covid or have been in contact with anyone that has tested positive in the last 10 days please notify you surgeon.      Pre-operative 5 CHG Bath Instructions   You can play a key role in reducing the risk of infection after surgery. Your skin needs to be as free of germs as possible. You can reduce the number of germs on your skin by washing with CHG (chlorhexidine gluconate) soap before surgery. CHG is an antiseptic soap that kills germs and continues to kill germs even after washing.   DO NOT use if you have an allergy to chlorhexidine/CHG or antibacterial soaps. If your skin becomes reddened or irritated, stop using the CHG and notify one of our RNs at [phone].  Please shower with the CHG soap starting 4 days before surgery using the following schedule:     Please keep in mind the following:  DO NOT shave, including legs and underarms, starting the day of your first shower.   You may shave your face at any point before/day of surgery.  Place clean sheets on your bed the day you start using CHG soap. Use a clean washcloth (not used since being washed) for each shower. DO NOT sleep with pets once you start using the CHG.   CHG Shower Instructions:  If you choose to wash your hair and private area, wash first with your normal shampoo/soap.  After you use shampoo/soap,  rinse your hair and body thoroughly to remove shampoo/soap residue.  Turn the water OFF and apply about 3 tablespoons (45 ml) of CHG soap to a CLEAN washcloth.  Apply CHG soap ONLY FROM YOUR NECK DOWN TO YOUR TOES (washing for 3-5 minutes)  DO NOT use CHG soap on face, private areas, open wounds, or sores.  Pay special attention to the area where your surgery is being performed.  If you are having back surgery, having someone wash your back for you may be helpful. Wait 2 minutes after CHG soap is applied, then you may rinse off the CHG soap.  Pat dry with a clean towel  Put on clean clothes/pajamas   If you choose to wear lotion, please use ONLY the CHG-compatible lotions on the back of this paper.     Additional instructions for the day of surgery: DO NOT APPLY any lotions, deodorants, cologne, or perfumes.   Put on clean/comfortable clothes.  Brush your teeth.  Ask your nurse before applying any prescription medications to the skin.      CHG Compatible Lotions   Aveeno Moisturizing lotion  Cetaphil Moisturizing Cream  Cetaphil Moisturizing Lotion  Clairol Herbal Essence Moisturizing Lotion, Dry Skin  Clairol Herbal Essence Moisturizing Lotion, Extra Dry Skin  Clairol Herbal Essence Moisturizing Lotion, Normal Skin  Curel Age Defying Therapeutic Moisturizing Lotion with Alpha Hydroxy  Curel Extreme Care Body Lotion  Curel Soothing Hands Moisturizing Hand Lotion  Curel Therapeutic Moisturizing Cream, Fragrance-Free  Curel Therapeutic Moisturizing Lotion, Fragrance-Free  Curel Therapeutic Moisturizing Lotion, Original Formula  Eucerin Daily Replenishing Lotion  Eucerin Dry Skin Therapy Plus Alpha Hydroxy Crme  Eucerin Dry Skin Therapy Plus Alpha Hydroxy Lotion  Eucerin Original Crme  Eucerin Original Lotion  Eucerin Plus Crme Eucerin Plus Lotion  Eucerin TriLipid Replenishing Lotion  Keri Anti-Bacterial Hand Lotion  Keri Deep Conditioning Original Lotion Dry Skin Formula  Softly Scented  Keri Deep Conditioning Original Lotion, Fragrance Free Sensitive Skin Formula  Keri Lotion Fast Absorbing Fragrance Free Sensitive Skin Formula  Keri Lotion Fast Absorbing Softly Scented Dry Skin Formula  Keri Original Lotion  Keri Skin Renewal Lotion Keri Silky Smooth Lotion  Keri Silky Smooth Sensitive Skin Lotion  Nivea Body Creamy Conditioning Oil  Nivea Body Extra Enriched Teacher, adult education Moisturizing Lotion Nivea Crme  Nivea Skin Firming Lotion  NutraDerm 30 Skin Lotion  NutraDerm Skin Lotion  NutraDerm Therapeutic Skin Cream  NutraDerm Therapeutic Skin Lotion  ProShield Protective Hand Cream  Provon moisturizing lotion

## 2023-06-17 ENCOUNTER — Encounter (HOSPITAL_COMMUNITY): Payer: Self-pay

## 2023-06-17 ENCOUNTER — Encounter (HOSPITAL_COMMUNITY)
Admission: RE | Admit: 2023-06-17 | Discharge: 2023-06-17 | Disposition: A | Discharge status: Home / Self Care | Payer: Medicare Other | Source: Ambulatory Visit | Attending: Orthopedic Surgery | Admitting: Orthopedic Surgery

## 2023-06-17 ENCOUNTER — Other Ambulatory Visit: Payer: Self-pay

## 2023-06-17 VITALS — BP 140/83 | HR 99 | Temp 98.7°F | Resp 16 | Ht 68.0 in | Wt 182.0 lb

## 2023-06-17 DIAGNOSIS — Z01812 Encounter for preprocedural laboratory examination: Secondary | ICD-10-CM | POA: Diagnosis present

## 2023-06-17 DIAGNOSIS — Z01818 Encounter for other preprocedural examination: Secondary | ICD-10-CM

## 2023-06-17 HISTORY — DX: Sjogren syndrome, unspecified: M35.00

## 2023-06-17 HISTORY — DX: Dyspnea, unspecified: R06.00

## 2023-06-17 LAB — CBC
HCT: 37.8 % (ref 36.0–46.0)
Hemoglobin: 12.7 g/dL (ref 12.0–15.0)
MCH: 34.4 pg — ABNORMAL HIGH (ref 26.0–34.0)
MCHC: 33.6 g/dL (ref 30.0–36.0)
MCV: 102.4 fL — ABNORMAL HIGH (ref 80.0–100.0)
Platelets: 221 10*3/uL (ref 150–400)
RBC: 3.69 MIL/uL — ABNORMAL LOW (ref 3.87–5.11)
RDW: 13.2 % (ref 11.5–15.5)
WBC: 4.2 10*3/uL (ref 4.0–10.5)
nRBC: 0 % (ref 0.0–0.2)

## 2023-06-17 LAB — BASIC METABOLIC PANEL
Anion gap: 9 (ref 5–15)
BUN: 14 mg/dL (ref 8–23)
CO2: 22 mmol/L (ref 22–32)
Calcium: 9 mg/dL (ref 8.9–10.3)
Chloride: 109 mmol/L (ref 98–111)
Creatinine, Ser: 1.36 mg/dL — ABNORMAL HIGH (ref 0.44–1.00)
GFR, Estimated: 42 mL/min — ABNORMAL LOW (ref >60)
Glucose, Bld: 148 mg/dL — ABNORMAL HIGH (ref 70–99)
Potassium: 2.8 mmol/L — ABNORMAL LOW (ref 3.5–5.1)
Sodium: 140 mmol/L (ref 135–145)

## 2023-06-17 LAB — SURGICAL PCR SCREEN
MRSA, PCR: NEGATIVE
Staphylococcus aureus: NEGATIVE

## 2023-06-21 ENCOUNTER — Ambulatory Visit: Payer: Self-pay | Admitting: Student

## 2023-06-21 NOTE — H&P (Signed)
TOTAL KNEE ADMISSION H&P  Patient is being admitted for left total knee arthroplasty.  Subjective:  Chief Complaint:left knee pain.  HPI: Marie Calhoun, 72 y.o. female, has a history of pain and functional disability in the left knee due to arthritis and has failed non-surgical conservative treatments for greater than 12 weeks to includeNSAID's and/or analgesics, corticosteriod injections, flexibility and strengthening excercises, supervised PT with diminished ADL's post treatment, use of assistive devices, and activity modification.  Onset of symptoms was gradual, starting >10 years ago with rapidlly worsening course since that time. The patient noted no past surgery on the left knee(s).  Patient currently rates pain in the left knee(s) at 10 out of 10 with activity. Patient has night pain, worsening of pain with activity and weight bearing, pain that interferes with activities of daily living, pain with passive range of motion, crepitus, and joint swelling.  Patient has evidence of subchondral cysts, subchondral sclerosis, periarticular osteophytes, and joint space narrowing by imaging studies. There is no active infection.  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   Asthma 05/28/2017   Environmental allergies 05/28/2017   Essential hypertension, benign 05/28/2017   GERD (gastroesophageal reflux disease) 05/28/2017   Past Medical History:  Diagnosis Date   Anxiety    Arthritis    Asthma 05/28/2017   Cancer (HCC)    skin cancer    Charcot-Marie-Tooth disease    Depression    Dyspnea    with exertion   Environmental allergies 05/28/2017   Esophageal stricture 05/28/2017   Essential hypertension, benign 05/28/2017   GERD (gastroesophageal reflux disease) 05/28/2017   Hypercholesteremia    Lupus (HCC)    of skin   PONV (postoperative nausea and vomiting)    Seasonal allergies    Sjogren's syndrome (HCC)     Past Surgical History:  Procedure Laterality Date   breast tumor\     left benign   CHOLECYSTECTOMY     COLONOSCOPY WITH PROPOFOL N/A 05/30/2021   Procedure: COLONOSCOPY WITH PROPOFOL;  Surgeon: Malissa Hippo, MD;  [facility];  Service: Endoscopy;  Laterality: N/A;  855   ESOPHAGEAL DILATION N/A 09/11/2017   Procedure: ESOPHAGEAL DILATION;  Surgeon: Malissa Hippo, MD;  Location: [facility];  Service: Endoscopy;  Laterality: N/A;   ESOPHAGEAL DILATION N/A 06/17/2018   Procedure: ESOPHAGEAL DILATION;  Surgeon: Malissa Hippo, MD;  [facility];  Service: Endoscopy;  Laterality: N/A;   ESOPHAGOGASTRODUODENOSCOPY N/A 09/11/2017   Procedure: ESOPHAGOGASTRODUODENOSCOPY (EGD);  Surgeon: Malissa Hippo, MD;  [facility]: [facility];  Service: Endoscopy;  Laterality: N/A;  1:40 - Can NOT come earlier   ESOPHAGOGASTRODUODENOSCOPY N/A 06/17/2018   Procedure: ESOPHAGOGASTRODUODENOSCOPY (EGD);  Surgeon: Malissa Hippo, MD;  [facility];  Service: Endoscopy;  Laterality: N/A;  10:30   hand tendon release Left    KNEE ARTHROPLASTY Right 07/17/2022   Procedure: COMPUTER ASSISTED TOTAL KNEE ARTHROPLASTY;  Surgeon: Samson Frederic, MD;  Location: [facility];  Service: Orthopedics;  Laterality: Right;  150   knee surgeries  1980   LUMBAR EPIDURAL INJECTION     PARTIAL HYSTERECTOMY     1995   POLYPECTOMY  05/30/2021   Procedure: POLYPECTOMY;  Surgeon: Malissa Hippo, MD;  [facility]: [facility];  Service: Endoscopy;;   TOTAL HIP ARTHROPLASTY Right 05/30/2022   Procedure:  TOTAL HIP ARTHROPLASTY ANTERIOR APPROACH;  Surgeon: Samson Frederic, MD;  Location: [facility];  Service: Orthopedics;  Laterality: Right;    Current Outpatient Medications  Medication Sig Dispense Refill Last Dose   acetaminophen (TYLENOL) 500 MG tablet Take 2 tablets (1,000 mg total) by mouth every 8 (eight) hours as needed for mild pain, moderate pain or fever. 30 tablet 0    acyclovir (ZOVIRAX) 400 MG tablet Take 400 mg by mouth 3 (three) times daily as needed (for cold sores/fever blisters (takes for 7 days when needed)).      albuterol (VENTOLIN HFA) 108 (90 Base) MCG/ACT inhaler Inhale 2 puffs into the lungs every 6 (six) hours as needed for wheezing or shortness of breath. 18 g 3    amoxicillin (AMOXIL) 500 MG capsule Take 2,000 mg by mouth See admin instructions. Take 1 hour prior to dental work      Calcium Carb-Cholecalciferol (CALCIUM 600 + D PO) Take 1 tablet by mouth 2 (two) times daily.      Cyanocobalamin (B-12) 2500 MCG TABS Take 2,500 mcg by mouth daily.      cycloSPORINE (RESTASIS) 0.05 % ophthalmic emulsion Place 1 drop into both eyes 2 (two) times daily.      fluticasone (FLONASE) 50 MCG/ACT nasal spray USE 1 TO 2 SPRAYS IN EACH NOSTRIL EVERY DAY AS NEEDED FOR ALLERGIES. 16 g 10    folic acid (FOLVITE) 1 MG tablet Take 1 mg by mouth daily.      gabapentin (NEURONTIN) 300 MG capsule Take 300 mg by mouth 3 (three) times daily.      ketoconazole (NIZORAL) 2 % cream Apply 1 Application topically daily as needed (fungus). Apply to nail and finger around affected area      lansoprazole (PREVACID) 30 MG capsule TAKE 1 CAPSULE BY MOUTH ONCE DAILY ON AN EMPTY STOMACH 30-45 MINUTES BEFORE BREAKFAST 90 capsule 3    LINZESS 72 MCG capsule TAKE 1 CAPSULE EVERY DAY BEFORE BREAKFAST (Patient taking differently: Take 144 mcg by mouth daily before breakfast.) 90 capsule 3    losartan-hydrochlorothiazide (HYZAAR) 50-12.5 MG tablet Take 0.5 tablets by mouth at bedtime.      methotrexate (RHEUMATREX)  2.5 MG tablet Take 10 mg by mouth every Wednesday.      mineral oil light external liquid Apply 1 Application topically daily as needed (In ears of needed).      mirabegron ER (MYRBETRIQ) 25 MG TB24 tablet Take 1 tablet (25 mg total) by mouth daily. 90 tablet 3    Multiple Vitamin (MULTIVITAMIN WITH MINERALS) TABS tablet Take 1 tablet by mouth in the morning. Woman's      predniSONE (DELTASONE) 10 MG tablet Take 10 mg by mouth daily.      silver sulfADIAZINE (SILVADENE) 1 % cream Apply 1 application topically daily as needed (foot wounds/sores).      triamcinolone cream (KENALOG) 0.1 % Apply 1 Application topically 3 (three) times daily as needed for irritation or itching.      No current facility-administered medications for this visit.   Allergies  Allergen Reactions   Doxycycline Itching and Rash   Demerol [Meperidine] Nausea Only   Pantoprazole Other (See Comments)    Subacute  Cutaneous Lupus of the Skin   Plaquenil [Hydroxychloroquine]     Caused another Rash on top of Lupus Rash   Sulfa Antibiotics Swelling    *Childhood*   Sulfamethoxazole-Trimethoprim Other (See Comments)    Childhood    Ranitidine Rash    Social History   Tobacco Use   Smoking status: Never    Passive exposure: Never   Smokeless tobacco: Never  Substance Use Topics   Alcohol use: No    No family history on file.   Review of Systems  Musculoskeletal:  Positive for arthralgias, gait problem and joint swelling.  All other systems reviewed and are negative.   Objective:  Physical Exam Constitutional:      Appearance: Normal appearance.  HENT:     Head: Normocephalic and atraumatic.     Nose: Nose normal.     Mouth/Throat:     Mouth: Mucous membranes are moist.     Pharynx: Oropharynx is clear.  Eyes:     Conjunctiva/sclera: Conjunctivae normal.  Cardiovascular:     Rate and Rhythm: Normal rate and regular rhythm.     Pulses: Normal pulses.     Heart sounds: Normal heart sounds.   Pulmonary:     Effort: Pulmonary effort is normal.     Breath sounds: Normal breath sounds.  Abdominal:     General: Abdomen is flat.     Palpations: Abdomen is soft.  Genitourinary:    Comments: deferred Musculoskeletal:     Cervical back: Normal range of motion and neck supple.     Comments: Examination of the left knee reveals no skin wounds or lesions. 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 She has swelling, trace effusion. No warmth or erythema. Mild valgus deformity. Range of motion is 15 to 105 degrees. No ligamentous instability.  Distally, she has palpable pedal pulses. She has subjective sensory change due to Charcot-Marie-Tooth. No focal motor deficit.AFO braces LE bilaterally.    No significant pedal edema. No calf tenderness to palpation.   Skin:    General: Skin is warm and dry.     Capillary Refill: Capillary refill takes less than 2 seconds.  Neurological:     Mental Status: She is alert and oriented to person, place, and time. Mental status is at baseline.  Psychiatric:        Mood and Affect: Mood normal.        Behavior: Behavior normal.        Thought Content: Thought content normal.        Judgment: Judgment normal.     Vital signs in last 24 hours: @VSRANGES @  Labs:   Estimated body mass index is 27.67 kg/m as calculated from the following:   Height as of 06/17/23: 5\' 8"  (1.727 m).   Weight as of 06/17/23: 82.6 kg.   Imaging Review Plain radiographs demonstrate severe degenerative joint disease of the left knee(s). The overall alignment issignificant valgus. The bone quality appears to be adequate for age and reported activity level.      Assessment/Plan:  End stage arthritis, left knee   The patient history, physical examination, clinical judgment of the provider and imaging studies are consistent with end stage degenerative joint disease of the left knee(s) and total knee arthroplasty is deemed medically necessary. The treatment options  including medical management, injection therapy arthroscopy and arthroplasty were discussed at length. The risks and benefits of total knee arthroplasty were presented and reviewed. The risks due to aseptic loosening, infection, stiffness, patella tracking  problems, thromboembolic complications and other imponderables were discussed. The patient acknowledged the explanation, agreed to proceed with the plan and consent was signed. Patient is being admitted for inpatient treatment for surgery, pain control, PT, OT, prophylactic antibiotics, VTE prophylaxis, progressive ambulation and ADL's and discharge planning. The patient is planning to be discharged home with OPPT after an overnight stay.   Therapy Plans: outpatient therapy. Luana Shu, [address]  Disposition: Home with [place] and [place]. Planned DVT Prophylaxis: aspirin 81mg  BID  DME needed: None has rolling walker and Iceman ice machine.  PCP: Cleared  Cardiology: Cleared  TXA: Yes  Allergies:  - Demerol - vomiting  - Doxycycline - rash  - Ranitidine - rash  - Sulfa antibiotics - swelling/unknown childhood. - Hydrochloroquine - rash  Anesthesia Concerns: Nausea with anesthesia.  BMI: 29 Last HgbA1c: Not diabetic.  Other:  - Asthma.  - SLE - methotrexate. Stopped 05/21/23. Hold postoperative until incision healed.  - Charcot Marie Tooth, AFOs bilaterally.  - Right lower back pain.  - Gabapentin 900mg  TID at baseline.  - S/p Rt THA, 05/30/22.  - S/p Rt TKA 07/17/22  - Oxycodone, methocarbamol, zofran, has meloxicam.  - [place]ples** - 06/05/23: Hgb 14.4, Cr. 1.10, K+ 3.7, Albumin 4.5.  - 06/17/23: K+ 2.8, Cr. 1.36, Hgb 12.7.     Patient's anticipated LOS is less than 2 midnights, meeting these requirements: - Younger than 42 - Lives within 1 hour of care - Has a competent adult at home to recover with post-op recover - NO history of  - Chronic pain requiring opiods  - Diabetes  - Coronary Artery Disease  - Heart failure  -  Heart attack  - Stroke  - DVT/VTE  - Cardiac arrhythmia  - Respiratory Failure/COPD  - Renal failure  - Anemia  - Advanced Liver disease

## 2023-06-21 NOTE — H&P (View-Only) (Signed)
TOTAL KNEE ADMISSION H&P  Patient is being admitted for left total knee arthroplasty.  Subjective:  Chief Complaint:left knee pain.  HPI: Marie Calhoun, 72 y.o. female, has a history of pain and functional disability in the left knee due to arthritis and has failed non-surgical conservative treatments for greater than 12 weeks to includeNSAID's and/or analgesics, corticosteriod injections, flexibility and strengthening excercises, supervised PT with diminished ADL's post treatment, use of assistive devices, and activity modification.  Onset of symptoms was gradual, starting >10 years ago with rapidlly worsening course since that time. The patient noted no past surgery on the left knee(s).  Patient currently rates pain in the left knee(s) at 10 out of 10 with activity. Patient has night pain, worsening of pain with activity and weight bearing, pain that interferes with activities of daily living, pain with passive range of motion, crepitus, and joint swelling.  Patient has evidence of subchondral cysts, subchondral sclerosis, periarticular osteophytes, and joint space narrowing by imaging studies. There is no active infection.  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   Asthma 05/28/2017   Environmental allergies 05/28/2017   Essential hypertension, benign 05/28/2017   GERD (gastroesophageal reflux disease) 05/28/2017   Past Medical History:  Diagnosis Date   Anxiety    Arthritis    Asthma 05/28/2017   Cancer (HCC)    skin cancer    Charcot-Marie-Tooth disease    Depression    Dyspnea    with exertion   Environmental allergies 05/28/2017   Esophageal stricture 05/28/2017   Essential hypertension, benign 05/28/2017   GERD (gastroesophageal reflux disease) 05/28/2017   Hypercholesteremia    Lupus (HCC)    of skin   PONV (postoperative nausea and vomiting)    Seasonal allergies    Sjogren's syndrome (HCC)     Past Surgical History:  Procedure Laterality Date   breast tumor\     left benign   CHOLECYSTECTOMY     COLONOSCOPY WITH PROPOFOL N/A 05/30/2021   Procedure: COLONOSCOPY WITH PROPOFOL;  Surgeon: Malissa Hippo, MD;  Location: [facility];  Service: Endoscopy;  Laterality: N/A;  855   ESOPHAGEAL DILATION N/A 09/11/2017   Procedure: ESOPHAGEAL DILATION;  Surgeon: Malissa Hippo, MD;  Location: [facility];  Service: Endoscopy;  Laterality: N/A;   ESOPHAGEAL DILATION N/A 06/17/2018   Procedure: ESOPHAGEAL DILATION;  Surgeon: Malissa Hippo, MD;  [facility]: [facility];  Service: Endoscopy;  Laterality: N/A;   ESOPHAGOGASTRODUODENOSCOPY N/A 09/11/2017   Procedure: ESOPHAGOGASTRODUODENOSCOPY (EGD);  Surgeon: Malissa Hippo, MD;  [facility]: [facility];  Service: Endoscopy;  Laterality: N/A;  1:40 - Can NOT come earlier   ESOPHAGOGASTRODUODENOSCOPY N/A 06/17/2018   Procedure: ESOPHAGOGASTRODUODENOSCOPY (EGD);  Surgeon: Malissa Hippo, MD;  [facility]: [facility];  Service: Endoscopy;  Laterality: N/A;  10:30   hand tendon release Left    KNEE ARTHROPLASTY Right 07/17/2022   Procedure: COMPUTER ASSISTED TOTAL KNEE ARTHROPLASTY;  Surgeon: Samson Frederic, MD;  Location: [facility];  Service: Orthopedics;  Laterality: Right;  150   knee surgeries  1980   LUMBAR EPIDURAL INJECTION     PARTIAL HYSTERECTOMY     1995   POLYPECTOMY  05/30/2021   Procedure: POLYPECTOMY;  Surgeon: Malissa Hippo, MD;  Location: [facility];  Service: Endoscopy;;   TOTAL HIP ARTHROPLASTY Right 05/30/2022   Procedure:  TOTAL HIP ARTHROPLASTY ANTERIOR APPROACH;  Surgeon: Samson Frederic, MD;  Location: [facility];  Service: Orthopedics;  Laterality: Right;    Current Outpatient Medications  Medication Sig Dispense Refill Last Dose   acetaminophen (TYLENOL) 500 MG tablet Take 2 tablets (1,000 mg total) by mouth every 8 (eight) hours as needed for mild pain, moderate pain or fever. 30 tablet 0    acyclovir (ZOVIRAX) 400 MG tablet Take 400 mg by mouth 3 (three) times daily as needed (for cold sores/fever blisters (takes for 7 days when needed)).      albuterol (VENTOLIN HFA) 108 (90 Base) MCG/ACT inhaler Inhale 2 puffs into the lungs every 6 (six) hours as needed for wheezing or shortness of breath. 18 g 3    amoxicillin (AMOXIL) 500 MG capsule Take 2,000 mg by mouth See admin instructions. Take 1 hour prior to dental work      Calcium Carb-Cholecalciferol (CALCIUM 600 + D PO) Take 1 tablet by mouth 2 (two) times daily.      Cyanocobalamin (B-12) 2500 MCG TABS Take 2,500 mcg by mouth daily.      cycloSPORINE (RESTASIS) 0.05 % ophthalmic emulsion Place 1 drop into both eyes 2 (two) times daily.      fluticasone (FLONASE) 50 MCG/ACT nasal spray USE 1 TO 2 SPRAYS IN EACH NOSTRIL EVERY DAY AS NEEDED FOR ALLERGIES. 16 g 10    folic acid (FOLVITE) 1 MG tablet Take 1 mg by mouth daily.      gabapentin (NEURONTIN) 300 MG capsule Take 300 mg by mouth 3 (three) times daily.      ketoconazole (NIZORAL) 2 % cream Apply 1 Application topically daily as needed (fungus). Apply to nail and finger around affected area      lansoprazole (PREVACID) 30 MG capsule TAKE 1 CAPSULE BY MOUTH ONCE DAILY ON AN EMPTY STOMACH 30-45 MINUTES BEFORE BREAKFAST 90 capsule 3    LINZESS 72 MCG capsule TAKE 1 CAPSULE EVERY DAY BEFORE BREAKFAST (Patient taking differently: Take 144 mcg by mouth daily before breakfast.) 90 capsule 3    losartan-hydrochlorothiazide (HYZAAR) 50-12.5 MG tablet Take 0.5 tablets by mouth at bedtime.      methotrexate (RHEUMATREX)  2.5 MG tablet Take 10 mg by mouth every Wednesday.      mineral oil light external liquid Apply 1 Application topically daily as needed (In ears of needed).      mirabegron ER (MYRBETRIQ) 25 MG TB24 tablet Take 1 tablet (25 mg total) by mouth daily. 90 tablet 3    Multiple Vitamin (MULTIVITAMIN WITH MINERALS) TABS tablet Take 1 tablet by mouth in the morning. Woman's      predniSONE (DELTASONE) 10 MG tablet Take 10 mg by mouth daily.      silver sulfADIAZINE (SILVADENE) 1 % cream Apply 1 application topically daily as needed (foot wounds/sores).      triamcinolone cream (KENALOG) 0.1 % Apply 1 Application topically 3 (three) times daily as needed for irritation or itching.      No current facility-administered medications for this visit.   Allergies  Allergen Reactions   Doxycycline Itching and Rash   Demerol [Meperidine] Nausea Only   Pantoprazole Other (See Comments)    Subacute  Cutaneous Lupus of the Skin   Plaquenil [Hydroxychloroquine]     Caused another Rash on top of Lupus Rash   Sulfa Antibiotics Swelling    *Childhood*   Sulfamethoxazole-Trimethoprim Other (See Comments)    Childhood    Ranitidine Rash    Social History   Tobacco Use   Smoking status: Never    Passive exposure: Never   Smokeless tobacco: Never  Substance Use Topics   Alcohol use: No    No family history on file.   Review of Systems  Musculoskeletal:  Positive for arthralgias, gait problem and joint swelling.  All other systems reviewed and are negative.   Objective:  Physical Exam Constitutional:      Appearance: Normal appearance.  HENT:     Head: Normocephalic and atraumatic.     Nose: Nose normal.     Mouth/Throat:     Mouth: Mucous membranes are moist.     Pharynx: Oropharynx is clear.  Eyes:     Conjunctiva/sclera: Conjunctivae normal.  Cardiovascular:     Rate and Rhythm: Normal rate and regular rhythm.     Pulses: Normal pulses.     Heart sounds: Normal heart sounds.   Pulmonary:     Effort: Pulmonary effort is normal.     Breath sounds: Normal breath sounds.  Abdominal:     General: Abdomen is flat.     Palpations: Abdomen is soft.  Genitourinary:    Comments: deferred Musculoskeletal:     Cervical back: Normal range of motion and neck supple.     Comments: Examination of the left knee reveals no skin wounds or lesions. 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 She has swelling, trace effusion. No warmth or erythema. Mild valgus deformity. Range of motion is 15 to 105 degrees. No ligamentous instability.  Distally, she has palpable pedal pulses. She has subjective sensory change due to Charcot-Marie-Tooth. No focal motor deficit.AFO braces LE bilaterally.    No significant pedal edema. No calf tenderness to palpation.   Skin:    General: Skin is warm and dry.     Capillary Refill: Capillary refill takes less than 2 seconds.  Neurological:     Mental Status: She is alert and oriented to person, place, and time. Mental status is at baseline.  Psychiatric:        Mood and Affect: Mood normal.        Behavior: Behavior normal.        Thought Content: Thought content normal.        Judgment: Judgment normal.     Vital signs in last 24 hours: @VSRANGES @  Labs:   Estimated body mass index is 27.67 kg/m as calculated from the following:   Height as of 06/17/23: 5\' 8"  (1.727 m).   Weight as of 06/17/23: 82.6 kg.   Imaging Review Plain radiographs demonstrate severe degenerative joint disease of the left knee(s). The overall alignment issignificant valgus. The bone quality appears to be adequate for age and reported activity level.      Assessment/Plan:  End stage arthritis, left knee   The patient history, physical examination, clinical judgment of the provider and imaging studies are consistent with end stage degenerative joint disease of the left knee(s) and total knee arthroplasty is deemed medically necessary. The treatment options  including medical management, injection therapy arthroscopy and arthroplasty were discussed at length. The risks and benefits of total knee arthroplasty were presented and reviewed. The risks due to aseptic loosening, infection, stiffness, patella tracking  problems, thromboembolic complications and other imponderables were discussed. The patient acknowledged the explanation, agreed to proceed with the plan and consent was signed. Patient is being admitted for inpatient treatment for surgery, pain control, PT, OT, prophylactic antibiotics, VTE prophylaxis, progressive ambulation and ADL's and discharge planning. The patient is planning to be discharged home with OPPT after an overnight stay.   Therapy Plans: outpatient therapy. Luana Shu, [address]  Disposition: Home with [place] and [place]. Planned DVT Prophylaxis: aspirin 81mg  BID  DME needed: None has rolling walker and Iceman ice machine.  PCP: Cleared  Cardiology: Cleared  TXA: Yes  Allergies:  - Demerol - vomiting  - Doxycycline - rash  - Ranitidine - rash  - Sulfa antibiotics - swelling/unknown childhood. - Hydrochloroquine - rash  Anesthesia Concerns: Nausea with anesthesia.  BMI: 29 Last HgbA1c: Not diabetic.  Other:  - Asthma.  - SLE - methotrexate. Stopped 05/21/23. Hold postoperative until incision healed.  - Charcot Marie Tooth, AFOs bilaterally.  - Right lower back pain.  - Gabapentin 900mg  TID at baseline.  - S/p Rt THA, 05/30/22.  - S/p Rt TKA 07/17/22  - Oxycodone, methocarbamol, zofran, has meloxicam.  - Staples** - 06/05/23: Hgb 14.4, Cr. 1.10, K+ 3.7, Albumin 4.5.  - 06/17/23: K+ 2.8, Cr. 1.36, Hgb 12.7.     Patient's anticipated LOS is less than 2 midnights, meeting these requirements: - Younger than 42 - Lives within 1 hour of care - Has a competent adult at home to recover with post-op recover - NO history of  - Chronic pain requiring opiods  - Diabetes  - Coronary Artery Disease  - Heart failure  -  Heart attack  - Stroke  - DVT/VTE  - Cardiac arrhythmia  - Respiratory Failure/COPD  - Renal failure  - Anemia  - Advanced Liver disease

## 2023-06-23 ENCOUNTER — Telehealth (INDEPENDENT_AMBULATORY_CARE_PROVIDER_SITE_OTHER): Payer: Self-pay | Admitting: *Deleted

## 2023-06-23 ENCOUNTER — Other Ambulatory Visit (INDEPENDENT_AMBULATORY_CARE_PROVIDER_SITE_OTHER): Payer: Self-pay | Admitting: Gastroenterology

## 2023-06-23 MED ORDER — LINACLOTIDE 145 MCG PO CAPS: 145.0000 ug | ORAL_CAPSULE | Freq: Every day | ORAL | 3 refills | Status: DC

## 2023-06-23 NOTE — Telephone Encounter (Signed)
Pt seen 7/23 and was taking linzess 72 mcg and pt states she was told to call when she needed a refill and 145 mcg would be called in since she was doubling up on 72 mcg.  [facility] mail order.

## 2023-06-25 ENCOUNTER — Encounter (HOSPITAL_COMMUNITY)
Admission: RE | Disposition: A | Discharge status: Home / Self Care | Payer: Self-pay | Source: Home / Self Care | Attending: Orthopedic Surgery

## 2023-06-25 ENCOUNTER — Ambulatory Visit (HOSPITAL_COMMUNITY): Payer: Medicare Other

## 2023-06-25 ENCOUNTER — Inpatient Hospital Stay (HOSPITAL_COMMUNITY)
Admission: RE | Admit: 2023-06-25 | Discharge: 2023-06-27 | DRG: 470 | Disposition: A | Discharge status: Home / Self Care | Payer: Medicare Other | Attending: Orthopedic Surgery | Admitting: Orthopedic Surgery

## 2023-06-25 ENCOUNTER — Ambulatory Visit (HOSPITAL_COMMUNITY): Payer: Medicare Other | Admitting: Physician Assistant

## 2023-06-25 ENCOUNTER — Other Ambulatory Visit: Payer: Self-pay

## 2023-06-25 ENCOUNTER — Ambulatory Visit (HOSPITAL_BASED_OUTPATIENT_CLINIC_OR_DEPARTMENT_OTHER): Payer: Medicare Other | Admitting: Registered Nurse

## 2023-06-25 ENCOUNTER — Encounter (HOSPITAL_COMMUNITY): Payer: Self-pay | Admitting: Orthopedic Surgery

## 2023-06-25 DIAGNOSIS — F418 Other specified anxiety disorders: Secondary | ICD-10-CM | POA: Diagnosis present

## 2023-06-25 DIAGNOSIS — Z882 Allergy status to sulfonamides status: Secondary | ICD-10-CM

## 2023-06-25 DIAGNOSIS — E7800 Pure hypercholesterolemia, unspecified: Secondary | ICD-10-CM | POA: Diagnosis present

## 2023-06-25 DIAGNOSIS — Z96641 Presence of right artificial hip joint: Secondary | ICD-10-CM | POA: Diagnosis present

## 2023-06-25 DIAGNOSIS — Z96651 Presence of right artificial knee joint: Secondary | ICD-10-CM | POA: Diagnosis present

## 2023-06-25 DIAGNOSIS — M1711 Unilateral primary osteoarthritis, right knee: Secondary | ICD-10-CM

## 2023-06-25 DIAGNOSIS — M329 Systemic lupus erythematosus, unspecified: Secondary | ICD-10-CM | POA: Diagnosis present

## 2023-06-25 DIAGNOSIS — Z79899 Other long term (current) drug therapy: Secondary | ICD-10-CM

## 2023-06-25 DIAGNOSIS — I10 Essential (primary) hypertension: Secondary | ICD-10-CM

## 2023-06-25 DIAGNOSIS — M3500 Sicca syndrome, unspecified: Secondary | ICD-10-CM | POA: Diagnosis present

## 2023-06-25 DIAGNOSIS — K219 Gastro-esophageal reflux disease without esophagitis: Secondary | ICD-10-CM | POA: Diagnosis present

## 2023-06-25 DIAGNOSIS — Z881 Allergy status to other antibiotic agents status: Secondary | ICD-10-CM

## 2023-06-25 DIAGNOSIS — J45909 Unspecified asthma, uncomplicated: Secondary | ICD-10-CM | POA: Diagnosis present

## 2023-06-25 DIAGNOSIS — M1712 Unilateral primary osteoarthritis, left knee: Secondary | ICD-10-CM | POA: Diagnosis not present

## 2023-06-25 DIAGNOSIS — Z85828 Personal history of other malignant neoplasm of skin: Secondary | ICD-10-CM

## 2023-06-25 DIAGNOSIS — Z885 Allergy status to narcotic agent status: Secondary | ICD-10-CM

## 2023-06-25 DIAGNOSIS — Z01818 Encounter for other preprocedural examination: Secondary | ICD-10-CM

## 2023-06-25 DIAGNOSIS — E876 Hypokalemia: Secondary | ICD-10-CM | POA: Diagnosis present

## 2023-06-25 DIAGNOSIS — Z7952 Long term (current) use of systemic steroids: Secondary | ICD-10-CM

## 2023-06-25 DIAGNOSIS — Z90711 Acquired absence of uterus with remaining cervical stump: Secondary | ICD-10-CM

## 2023-06-25 DIAGNOSIS — Z888 Allergy status to other drugs, medicaments and biological substances status: Secondary | ICD-10-CM

## 2023-06-25 HISTORY — PX: KNEE ARTHROPLASTY: SHX992

## 2023-06-25 SURGERY — ARTHROPLASTY, KNEE, TOTAL, USING IMAGELESS COMPUTER-ASSISTED NAVIGATION
Anesthesia: Spinal | Site: Knee | Laterality: Left

## 2023-06-25 MED ORDER — OXYCODONE HCL 5 MG PO TABS
5.0000 mg | ORAL_TABLET | Freq: Once | ORAL | Status: AC | PRN
  Administered 2023-06-25: 5 mg via ORAL

## 2023-06-25 MED ORDER — SODIUM CHLORIDE 0.9 % IR SOLN
Status: DC | PRN
  Administered 2023-06-25: 1000 mL
  Administered 2023-06-25: 3000 mL

## 2023-06-25 MED ORDER — ACETAMINOPHEN 500 MG PO TABS
1000.0000 mg | ORAL_TABLET | Freq: Once | ORAL | Status: AC
  Administered 2023-06-25: 1000 mg via ORAL
  Filled 2023-06-25: qty 2

## 2023-06-25 MED ORDER — HYDROCORTISONE SOD SUC (PF) 100 MG IJ SOLR
25.0000 mg | Freq: Three times a day (TID) | INTRAMUSCULAR | Status: AC
  Administered 2023-06-25 – 2023-06-26 (×3): 25 mg via INTRAVENOUS
  Filled 2023-06-25 (×3): qty 0.5

## 2023-06-25 MED ORDER — TRANEXAMIC ACID-NACL 1000-0.7 MG/100ML-% IV SOLN
1000.0000 mg | INTRAVENOUS | Status: AC
  Administered 2023-06-25: 1000 mg via INTRAVENOUS
  Filled 2023-06-25: qty 100

## 2023-06-25 MED ORDER — LACTATED RINGERS IV SOLN: INTRAVENOUS | Status: DC

## 2023-06-25 MED ORDER — OXYCODONE HCL 5 MG/5ML PO SOLN: 5.0000 mg | Freq: Once | ORAL | Status: AC | PRN

## 2023-06-25 MED ORDER — CEFAZOLIN SODIUM-DEXTROSE 2-4 GM/100ML-% IV SOLN
2.0000 g | INTRAVENOUS | Status: AC
  Administered 2023-06-25: 2 g via INTRAVENOUS
  Filled 2023-06-25: qty 100

## 2023-06-25 MED ORDER — ALBUTEROL SULFATE (2.5 MG/3ML) 0.083% IN NEBU: 2.5000 mg | INHALATION_SOLUTION | Freq: Four times a day (QID) | RESPIRATORY_TRACT | Status: DC | PRN

## 2023-06-25 MED ORDER — METOCLOPRAMIDE HCL 5 MG PO TABS: 5.0000 mg | ORAL_TABLET | Freq: Three times a day (TID) | ORAL | Status: DC | PRN

## 2023-06-25 MED ORDER — ONDANSETRON HCL 4 MG/2ML IJ SOLN
INTRAMUSCULAR | Status: AC
  Filled 2023-06-25: qty 2

## 2023-06-25 MED ORDER — POVIDONE-IODINE 10 % EX SWAB: 2.0000 | Freq: Once | CUTANEOUS | Status: DC

## 2023-06-25 MED ORDER — ACETAMINOPHEN 325 MG PO TABS: 325.0000 mg | ORAL_TABLET | Freq: Four times a day (QID) | ORAL | Status: DC | PRN

## 2023-06-25 MED ORDER — PROPOFOL 500 MG/50ML IV EMUL
INTRAVENOUS | Status: DC | PRN
  Administered 2023-06-25: 40 ug/kg/min via INTRAVENOUS

## 2023-06-25 MED ORDER — DIPHENHYDRAMINE HCL 12.5 MG/5ML PO ELIX: 12.5000 mg | ORAL_SOLUTION | ORAL | Status: DC | PRN

## 2023-06-25 MED ORDER — MIDAZOLAM HCL 2 MG/2ML IJ SOLN: 2.0000 mg | INTRAMUSCULAR | Status: DC

## 2023-06-25 MED ORDER — METOCLOPRAMIDE HCL 5 MG/ML IJ SOLN: 5.0000 mg | Freq: Three times a day (TID) | INTRAMUSCULAR | Status: DC | PRN

## 2023-06-25 MED ORDER — MORPHINE SULFATE (PF) 2 MG/ML IV SOLN: 0.5000 mg | INTRAVENOUS | Status: DC | PRN

## 2023-06-25 MED ORDER — HYDROCORTISONE SOD SUC (PF) 100 MG IJ SOLR
50.0000 mg | INTRAMUSCULAR | Status: AC
  Administered 2023-06-25: 50 mg via INTRAVENOUS
  Filled 2023-06-25: qty 1

## 2023-06-25 MED ORDER — ONDANSETRON HCL 4 MG/2ML IJ SOLN
INTRAMUSCULAR | Status: DC | PRN
  Administered 2023-06-25: 4 mg via INTRAVENOUS

## 2023-06-25 MED ORDER — GABAPENTIN 300 MG PO CAPS
300.0000 mg | ORAL_CAPSULE | Freq: Three times a day (TID) | ORAL | Status: DC
  Administered 2023-06-25 – 2023-06-27 (×6): 300 mg via ORAL
  Filled 2023-06-25 (×6): qty 1

## 2023-06-25 MED ORDER — VITAMIN B-12 1000 MCG PO TABS
2500.0000 ug | ORAL_TABLET | Freq: Every day | ORAL | Status: DC
  Administered 2023-06-26 – 2023-06-27 (×2): 2500 ug via ORAL
  Filled 2023-06-25 (×2): qty 3

## 2023-06-25 MED ORDER — CALCIUM CARB-CHOLECALCIFEROL 600-5 MG-MCG PO TABS: ORAL_TABLET | Freq: Two times a day (BID) | ORAL | Status: DC

## 2023-06-25 MED ORDER — METHOCARBAMOL 500 MG IVPB - SIMPLE MED: 500.0000 mg | Freq: Four times a day (QID) | INTRAVENOUS | Status: DC | PRN

## 2023-06-25 MED ORDER — POLYETHYLENE GLYCOL 3350 17 G PO PACK
17.0000 g | PACK | Freq: Every day | ORAL | Status: DC | PRN
  Administered 2023-06-26: 17 g via ORAL
  Filled 2023-06-25: qty 1

## 2023-06-25 MED ORDER — PHENYLEPHRINE HCL-NACL 20-0.9 MG/250ML-% IV SOLN
INTRAVENOUS | Status: DC | PRN
  Administered 2023-06-25: 25 ug/min via INTRAVENOUS

## 2023-06-25 MED ORDER — FENTANYL CITRATE PF 50 MCG/ML IJ SOSY
100.0000 ug | PREFILLED_SYRINGE | INTRAMUSCULAR | Status: AC
  Administered 2023-06-25: 50 ug via INTRAVENOUS
  Filled 2023-06-25: qty 2

## 2023-06-25 MED ORDER — SODIUM CHLORIDE (PF) 0.9 % IJ SOLN
INTRAMUSCULAR | Status: DC | PRN
  Administered 2023-06-25: 30 mL

## 2023-06-25 MED ORDER — ASPIRIN 81 MG PO CHEW
81.0000 mg | CHEWABLE_TABLET | Freq: Two times a day (BID) | ORAL | Status: DC
  Administered 2023-06-25 – 2023-06-27 (×4): 81 mg via ORAL
  Filled 2023-06-25 (×4): qty 1

## 2023-06-25 MED ORDER — CHLORHEXIDINE GLUCONATE 0.12 % MT SOLN
15.0000 mL | Freq: Once | OROMUCOSAL | Status: AC
  Administered 2023-06-25: 15 mL via OROMUCOSAL

## 2023-06-25 MED ORDER — ALBUTEROL SULFATE HFA 108 (90 BASE) MCG/ACT IN AERS: 2.0000 | INHALATION_SPRAY | Freq: Four times a day (QID) | RESPIRATORY_TRACT | Status: DC | PRN

## 2023-06-25 MED ORDER — DOCUSATE SODIUM 100 MG PO CAPS
100.0000 mg | ORAL_CAPSULE | Freq: Two times a day (BID) | ORAL | Status: DC
  Administered 2023-06-25 – 2023-06-27 (×4): 100 mg via ORAL
  Filled 2023-06-25 (×4): qty 1

## 2023-06-25 MED ORDER — CEFAZOLIN SODIUM-DEXTROSE 2-4 GM/100ML-% IV SOLN
2.0000 g | Freq: Four times a day (QID) | INTRAVENOUS | Status: AC
  Administered 2023-06-25 – 2023-06-26 (×2): 2 g via INTRAVENOUS
  Filled 2023-06-25 (×2): qty 100

## 2023-06-25 MED ORDER — OXYCODONE HCL 5 MG PO TABS
ORAL_TABLET | ORAL | Status: AC
  Filled 2023-06-25: qty 1

## 2023-06-25 MED ORDER — LINACLOTIDE 145 MCG PO CAPS
145.0000 ug | ORAL_CAPSULE | Freq: Every day | ORAL | Status: DC
  Administered 2023-06-25 – 2023-06-27 (×3): 145 ug via ORAL
  Filled 2023-06-25 (×3): qty 1

## 2023-06-25 MED ORDER — KETOROLAC TROMETHAMINE 30 MG/ML IJ SOLN
INTRAMUSCULAR | Status: DC | PRN
  Administered 2023-06-25: 30 mg

## 2023-06-25 MED ORDER — KETOROLAC TROMETHAMINE 30 MG/ML IJ SOLN
INTRAMUSCULAR | Status: AC
  Filled 2023-06-25: qty 1

## 2023-06-25 MED ORDER — METHOCARBAMOL 500 MG PO TABS
ORAL_TABLET | ORAL | Status: AC
  Filled 2023-06-25: qty 1

## 2023-06-25 MED ORDER — SODIUM CHLORIDE 0.9 % IV SOLN: INTRAVENOUS | Status: DC

## 2023-06-25 MED ORDER — LANSOPRAZOLE 15 MG PO CPDR: 30.0000 mg | DELAYED_RELEASE_CAPSULE | Freq: Every day | ORAL | Status: DC

## 2023-06-25 MED ORDER — B-12 2500 MCG PO TABS: 2500.0000 ug | ORAL_TABLET | Freq: Every day | ORAL | Status: DC

## 2023-06-25 MED ORDER — MIRABEGRON ER 25 MG PO TB24
25.0000 mg | ORAL_TABLET | Freq: Every day | ORAL | Status: DC
  Administered 2023-06-25 – 2023-06-27 (×3): 25 mg via ORAL
  Filled 2023-06-25 (×3): qty 1

## 2023-06-25 MED ORDER — ONDANSETRON HCL 4 MG PO TABS: 4.0000 mg | ORAL_TABLET | Freq: Four times a day (QID) | ORAL | Status: DC | PRN

## 2023-06-25 MED ORDER — BUPIVACAINE IN DEXTROSE 0.75-8.25 % IT SOLN
INTRATHECAL | Status: DC | PRN
Start: 2023-06-25 — End: 2023-06-25
  Administered 2023-06-25: 1.8 mL via INTRATHECAL

## 2023-06-25 MED ORDER — PHENOL 1.4 % MT LIQD: 1.0000 | OROMUCOSAL | Status: DC | PRN

## 2023-06-25 MED ORDER — MENTHOL 3 MG MT LOZG: 1.0000 | LOZENGE | OROMUCOSAL | Status: DC | PRN

## 2023-06-25 MED ORDER — BUPIVACAINE-EPINEPHRINE 0.25% -1:200000 IJ SOLN
INTRAMUSCULAR | Status: DC | PRN
  Administered 2023-06-25: 30 mL

## 2023-06-25 MED ORDER — HYDROMORPHONE HCL 1 MG/ML IJ SOLN: 0.2500 mg | INTRAMUSCULAR | Status: DC | PRN

## 2023-06-25 MED ORDER — METHOCARBAMOL 500 MG PO TABS
500.0000 mg | ORAL_TABLET | Freq: Four times a day (QID) | ORAL | Status: DC | PRN
  Administered 2023-06-25 – 2023-06-27 (×4): 500 mg via ORAL
  Filled 2023-06-25 (×3): qty 1

## 2023-06-25 MED ORDER — DEXAMETHASONE SODIUM PHOSPHATE 10 MG/ML IJ SOLN
INTRAMUSCULAR | Status: AC
  Filled 2023-06-25: qty 1

## 2023-06-25 MED ORDER — VANCOMYCIN HCL 1000 MG IV SOLR
INTRAVENOUS | Status: AC
  Filled 2023-06-25: qty 20

## 2023-06-25 MED ORDER — ACYCLOVIR 400 MG PO TABS: 400.0000 mg | ORAL_TABLET | Freq: Three times a day (TID) | ORAL | Status: DC | PRN

## 2023-06-25 MED ORDER — ACETAMINOPHEN 500 MG PO TABS
500.0000 mg | ORAL_TABLET | Freq: Four times a day (QID) | ORAL | Status: AC
  Administered 2023-06-25: 500 mg via ORAL
  Filled 2023-06-25: qty 1

## 2023-06-25 MED ORDER — ROPIVACAINE HCL 5 MG/ML IJ SOLN
INTRAMUSCULAR | Status: DC | PRN
  Administered 2023-06-25: 30 mL via PERINEURAL

## 2023-06-25 MED ORDER — OYSTER SHELL CALCIUM/D3 500-5 MG-MCG PO TABS
1.0000 | ORAL_TABLET | Freq: Two times a day (BID) | ORAL | Status: DC
  Administered 2023-06-25 – 2023-06-27 (×4): 1 via ORAL
  Filled 2023-06-25 (×4): qty 1

## 2023-06-25 MED ORDER — PROPOFOL 1000 MG/100ML IV EMUL
INTRAVENOUS | Status: AC
  Filled 2023-06-25: qty 100

## 2023-06-25 MED ORDER — ONDANSETRON HCL 4 MG/2ML IJ SOLN: 4.0000 mg | Freq: Once | INTRAMUSCULAR | Status: DC | PRN

## 2023-06-25 MED ORDER — FOLIC ACID 1 MG PO TABS
1.0000 mg | ORAL_TABLET | Freq: Every day | ORAL | Status: DC
  Administered 2023-06-26 – 2023-06-27 (×2): 1 mg via ORAL
  Filled 2023-06-25 (×2): qty 1

## 2023-06-25 MED ORDER — BUPIVACAINE-EPINEPHRINE (PF) 0.5% -1:200000 IJ SOLN
INTRAMUSCULAR | Status: AC
  Filled 2023-06-25: qty 30

## 2023-06-25 MED ORDER — HYDROCODONE-ACETAMINOPHEN 7.5-325 MG PO TABS: 1.0000 | ORAL_TABLET | ORAL | Status: DC | PRN

## 2023-06-25 MED ORDER — ADULT MULTIVITAMIN W/MINERALS CH
1.0000 | ORAL_TABLET | Freq: Every day | ORAL | Status: DC
  Administered 2023-06-26 – 2023-06-27 (×2): 1 via ORAL
  Filled 2023-06-25 (×2): qty 1

## 2023-06-25 MED ORDER — SODIUM CHLORIDE (PF) 0.9 % IJ SOLN
INTRAMUSCULAR | Status: AC
  Filled 2023-06-25: qty 30

## 2023-06-25 MED ORDER — ORAL CARE MOUTH RINSE: 15.0000 mL | Freq: Once | OROMUCOSAL | Status: AC

## 2023-06-25 MED ORDER — FLUTICASONE PROPIONATE 50 MCG/ACT NA SUSP
2.0000 | Freq: Every day | NASAL | Status: DC
  Filled 2023-06-25: qty 16

## 2023-06-25 MED ORDER — SENNA 8.6 MG PO TABS
1.0000 | ORAL_TABLET | Freq: Two times a day (BID) | ORAL | Status: DC
  Administered 2023-06-25 – 2023-06-27 (×4): 8.6 mg via ORAL
  Filled 2023-06-25 (×4): qty 1

## 2023-06-25 MED ORDER — ISOPROPYL ALCOHOL 70 % SOLN
Status: DC | PRN
  Administered 2023-06-25: 1 via TOPICAL

## 2023-06-25 MED ORDER — STERILE WATER FOR IRRIGATION IR SOLN
Status: DC | PRN
  Administered 2023-06-25: 2000 mL

## 2023-06-25 MED ORDER — PANTOPRAZOLE SODIUM 40 MG PO TBEC: 40.0000 mg | DELAYED_RELEASE_TABLET | Freq: Every day | ORAL | Status: DC

## 2023-06-25 MED ORDER — ALUM & MAG HYDROXIDE-SIMETH 200-200-20 MG/5ML PO SUSP: 30.0000 mL | ORAL | Status: DC | PRN

## 2023-06-25 MED ORDER — CYCLOSPORINE 0.05 % OP EMUL
1.0000 [drp] | Freq: Two times a day (BID) | OPHTHALMIC | Status: DC
  Administered 2023-06-25 – 2023-06-27 (×4): 1 [drp] via OPHTHALMIC
  Filled 2023-06-25 (×4): qty 30

## 2023-06-25 MED ORDER — AMISULPRIDE (ANTIEMETIC) 5 MG/2ML IV SOLN: 10.0000 mg | Freq: Once | INTRAVENOUS | Status: DC | PRN

## 2023-06-25 MED ORDER — ONDANSETRON HCL 4 MG/2ML IJ SOLN: 4.0000 mg | Freq: Four times a day (QID) | INTRAMUSCULAR | Status: DC | PRN

## 2023-06-25 MED ORDER — HYDROCODONE-ACETAMINOPHEN 5-325 MG PO TABS
1.0000 | ORAL_TABLET | ORAL | Status: DC | PRN
  Administered 2023-06-25 – 2023-06-26 (×4): 2 via ORAL
  Filled 2023-06-25 (×4): qty 2

## 2023-06-25 MED ORDER — VANCOMYCIN HCL 1000 MG IV SOLR
INTRAVENOUS | Status: DC | PRN
  Administered 2023-06-25: 1000 mg via TOPICAL

## 2023-06-25 SURGICAL SUPPLY — 74 items
ADH SKN CLS APL DERMABOND .7 (GAUZE/BANDAGES/DRESSINGS) ×2
ADH SKN CLS LQ APL DERMABOND (GAUZE/BANDAGES/DRESSINGS) ×1
APL PRP STRL LF DISP 70% ISPRP (MISCELLANEOUS) ×2
BAG COUNTER SPONGE SURGICOUNT (BAG) IMPLANT
BAG SPEC THK2 15X12 ZIP CLS (MISCELLANEOUS)
BAG SPNG CNTER NS LX DISP (BAG)
BAG ZIPLOCK 12X15 (MISCELLANEOUS) IMPLANT
BATTERY INSTRU NAVIGATION (MISCELLANEOUS) ×3 IMPLANT
BLADE SAW RECIPROCATING 77.5 (BLADE) ×1 IMPLANT
BNDG CMPR 5X4 KNIT ELC UNQ LF (GAUZE/BANDAGES/DRESSINGS) ×1
BNDG CMPR 6 X 5 YARDS HK CLSR (GAUZE/BANDAGES/DRESSINGS) ×1
BNDG CMPR MED 10X6 ELC LF (GAUZE/BANDAGES/DRESSINGS) ×1
BNDG ELASTIC 4INX 5YD STR LF (GAUZE/BANDAGES/DRESSINGS) ×1 IMPLANT
BNDG ELASTIC 6INX 5YD STR LF (GAUZE/BANDAGES/DRESSINGS) ×1 IMPLANT
BNDG ELASTIC 6X10 VLCR STRL LF (GAUZE/BANDAGES/DRESSINGS) IMPLANT
BTRY SRG DRVR LF (MISCELLANEOUS) ×3
CHLORAPREP W/TINT 26 (MISCELLANEOUS) ×2 IMPLANT
COMP FEM KNEE PS NRW 10 LT (Joint) ×1 IMPLANT
COMP PATELLA 3 PEG 35 (Joint) ×1 IMPLANT
COMP TIB KNEE PS 0D LT (Joint) ×1 IMPLANT
COMPONENT FEM KN PS NRW 10 LT (Joint) IMPLANT
COMPONENT PATELLA 3 PEG 35 (Joint) IMPLANT
COMPONENT TIB KNEE PS 0D LT (Joint) IMPLANT
COVER SURGICAL LIGHT HANDLE (MISCELLANEOUS) ×1 IMPLANT
DERMABOND ADVANCED .7 DNX12 (GAUZE/BANDAGES/DRESSINGS) ×2 IMPLANT
DERMABOND ADVANCED .7 DNX6 (GAUZE/BANDAGES/DRESSINGS) IMPLANT
DRAPE SHEET LG 3/4 BI-LAMINATE (DRAPES) ×3 IMPLANT
DRAPE U-SHAPE 47X51 STRL (DRAPES) ×1 IMPLANT
DRSG AQUACEL AG ADV 3.5X10 (GAUZE/BANDAGES/DRESSINGS) ×1 IMPLANT
ELECT BLADE TIP CTD 4 INCH (ELECTRODE) ×1 IMPLANT
ELECT REM PT RETURN 15FT ADLT (MISCELLANEOUS) ×1 IMPLANT
GAUZE SPONGE 4X4 12PLY STRL (GAUZE/BANDAGES/DRESSINGS) ×1 IMPLANT
GLOVE BIO SURGEON STRL SZ7 (GLOVE) ×1 IMPLANT
GLOVE BIO SURGEON STRL SZ8.5 (GLOVE) ×2 IMPLANT
GLOVE BIOGEL PI IND STRL 7.5 (GLOVE) ×1 IMPLANT
GLOVE BIOGEL PI IND STRL 8.5 (GLOVE) ×1 IMPLANT
GOWN SPEC L3 XXLG W/TWL (GOWN DISPOSABLE) ×1 IMPLANT
GOWN STRL REUS W/ TWL XL LVL3 (GOWN DISPOSABLE) ×1 IMPLANT
GOWN STRL REUS W/TWL XL LVL3 (GOWN DISPOSABLE) ×1
HANDPIECE INTERPULSE COAX TIP (DISPOSABLE) ×1
HOLDER FOLEY CATH W/STRAP (MISCELLANEOUS) ×1 IMPLANT
HOOD PEEL AWAY T7 (MISCELLANEOUS) ×3 IMPLANT
INSERT TIB ASF EF/3-11 10 LT (Insert) IMPLANT
KIT TURNOVER KIT A (KITS) IMPLANT
MARKER SKIN DUAL TIP RULER LAB (MISCELLANEOUS) ×1 IMPLANT
NDL SAFETY ECLIP 18X1.5 (MISCELLANEOUS) ×1 IMPLANT
NDL SPNL 18GX3.5 QUINCKE PK (NEEDLE) ×1 IMPLANT
NEEDLE SPNL 18GX3.5 QUINCKE PK (NEEDLE) ×1 IMPLANT
NS IRRIG 1000ML POUR BTL (IV SOLUTION) ×1 IMPLANT
PACK TOTAL KNEE CUSTOM (KITS) ×1 IMPLANT
PADDING CAST COTTON 6X4 STRL (CAST SUPPLIES) ×1 IMPLANT
PIN DRILL HDLS TROCAR 75 4PK (PIN) IMPLANT
PROTECTOR NERVE ULNAR (MISCELLANEOUS) ×1 IMPLANT
SAW OSC TIP CART 19.5X105X1.3 (SAW) ×1 IMPLANT
SCREW FEMALE HEX FIX 25X2.5 (ORTHOPEDIC DISPOSABLE SUPPLIES) IMPLANT
SEALER BIPOLAR AQUA 6.0 (INSTRUMENTS) ×1 IMPLANT
SET HNDPC FAN SPRY TIP SCT (DISPOSABLE) ×1 IMPLANT
SET PAD KNEE POSITIONER (MISCELLANEOUS) ×1 IMPLANT
SOLUTION PRONTOSAN WOUND 350ML (IRRIGATION / IRRIGATOR) IMPLANT
SPIKE FLUID TRANSFER (MISCELLANEOUS) ×2 IMPLANT
SUT MNCRL AB 3-0 PS2 18 (SUTURE) ×1 IMPLANT
SUT MON AB 2-0 CT1 36 (SUTURE) ×1 IMPLANT
SUT STRATAFIX PDO 1 14 VIOLET (SUTURE) ×1
SUT STRATFX PDO 1 14 VIOLET (SUTURE) ×1
SUT VIC AB 1 CTX 36 (SUTURE) ×2
SUT VIC AB 1 CTX36XBRD ANBCTR (SUTURE) ×2 IMPLANT
SUT VIC AB 2-0 CT1 27 (SUTURE) ×1
SUT VIC AB 2-0 CT1 TAPERPNT 27 (SUTURE) ×1 IMPLANT
SUTURE STRATFX PDO 1 14 VIOLET (SUTURE) ×1 IMPLANT
SYR 3ML LL SCALE MARK (SYRINGE) ×1 IMPLANT
TRAY FOLEY MTR SLVR 16FR STAT (SET/KITS/TRAYS/PACK) IMPLANT
TUBE SUCTION HIGH CAP CLEAR NV (SUCTIONS) ×1 IMPLANT
WATER STERILE IRR 1000ML POUR (IV SOLUTION) ×2 IMPLANT
WRAP KNEE MAXI GEL POST OP (GAUZE/BANDAGES/DRESSINGS) IMPLANT

## 2023-06-25 NOTE — Op Note (Signed)
OPERATIVE REPORT  SURGEON: Samson Frederic, MD   ASSISTANT: Herbert Pun, PA-C  PREOPERATIVE DIAGNOSIS: Primary Left knee arthritis.   POSTOPERATIVE DIAGNOSIS: Primary Left knee arthritis.   PROCEDURE: Computer assisted Left total knee arthroplasty.   IMPLANTS: Zimmer Persona PPS Cementless CR femur, size 10 Narrow. Persona 0 degree Spiked Keel OsseoTi Tibia, size F. Vivacit-E polyethelyene insert, size 35 mm, CR. OsseoTi 3-Peg patella, size 35 mm.  ANESTHESIA:  MAC, Regional, and Spinal  TOURNIQUET TIME: Not utilized.   ESTIMATED BLOOD LOSS:-200 mL    ANTIBIOTICS: 2g Ancef.  DRAINS: None.  COMPLICATIONS: None   CONDITION: PACU - hemodynamically stable.   BRIEF CLINICAL NOTE: Marie Calhoun is a 72 y.o. female with a long-standing history of Left knee arthritis. After failing conservative management, the patient was indicated for total knee arthroplasty. The risks, benefits, and alternatives to the procedure were explained, and the patient elected to proceed.  PROCEDURE IN DETAIL: Adductor canal block was obtained in the pre-op holding area. Once inside the operative room, spinal anesthesia was obtained, and a foley catheter was inserted. The patient was then positioned and the lower extremity was prepped and draped in the normal sterile surgical fashion.  A time-out was called verifying side and site of surgery. The patient received IV antibiotics within 60 minutes of beginning the procedure. A tourniquet was not utilized.   An anterior approach to the knee was performed utilizing a midvastus arthrotomy. A medial release was performed and the patellar fat pad was excised. Stryker imageless navigation was used to cut the distal femur perpendicular to the mechanical axis. A freehand patellar resection was performed, and the patella was sized an prepared with 3 lug holes.  Nagivation was used to make a neutral proximal tibia resection, taking 3 mm of bone from the less affected medial  side with 3 degrees of slope. The menisci were excised. A spacer block was placed, and the alignment and balance in extension were confirmed.   The distal femur was sized using the 3-degree external rotation guide referencing the posterior femoral cortex. The appropriate 4-in-1 cutting block was pinned into place. Rotation was checked using Whiteside's line, the epicondylar axis, and then confirmed with a spacer block in flexion. The remaining femoral cuts were performed, taking care to protect the MCL.  The tibia was sized and the trial tray was pinned into place. The remaining trail components were inserted. The knee was stable to varus and valgus stress through a full range of motion. The patella tracked centrally, and the PCL was well balanced. The trial components were removed, and the proximal tibial surface was prepared. Final components were impacted into place. The knee was tested for a final time and found to be well balanced.   The wound was copiously irrigated with Prontosan solution and normal saline using pulse lavage.  Marcaine solution was injected into the periarticular soft tissue.  The wound was closed in layers using #1 Vicryl and Stratafix for the fascia, 2-0 Vicryl for the subcutaneous fat, 2-0 Monocryl for the deep dermal layer, and staples. Dermabond was applied to the skin.  Once the glue was fully dried, an Aquacell Ag and compressive dressing were applied.  The patient was transported to the recovery room in stable condition.  Sponge, needle, and instrument counts were correct at the end of the case x2.  The patient tolerated the procedure well and there were no known complications.  The aquamantis was utilized for this case to help facilitate better hemostasis as  patient was felt to be at increased risk of bleeding because of complex case requiring increased OR time and/or exposure.  -minimally invasive approach.  A oscillating saw tip was utilized for this case to prevent  damage to the soft tissue structures such as muscles, ligaments and tendons, and to ensure accurate bone cuts. This patient was at increased risk for above structures due to  minimally invasive approach.  Please note that a surgical assistant was a medical necessity for this procedure in order to perform it in a safe and expeditious manner. Surgical assistant was necessary to retract the ligaments and vital neurovascular structures to prevent injury to them and also necessary for proper positioning of the limb to allow for anatomic placement of the prosthesis.

## 2023-06-25 NOTE — Anesthesia Preprocedure Evaluation (Signed)
Anesthesia Evaluation  Patient identified by MRN, date of birth, ID band Patient awake    Reviewed: Allergy & Precautions, NPO status , Patient's Chart, lab work & pertinent test results  History of Anesthesia Complications (+) PONV and history of anesthetic complications  Airway Mallampati: II  TM Distance: >3 FB Neck ROM: Full    Dental no notable dental hx. (+) Partial Upper, Partial Lower, Poor Dentition   Pulmonary shortness of breath and with exertion, asthma    Pulmonary exam normal breath sounds clear to auscultation       Cardiovascular hypertension, Normal cardiovascular exam Rhythm:Regular Rate:Normal     Neuro/Psych  PSYCHIATRIC DISORDERS Anxiety Depression    Charcot Marie Tooth disease  Neuromuscular disease    GI/Hepatic Neg liver ROS,GERD  Medicated,,  Endo/Other  negative endocrine ROS    Renal/GU Lab Results      Component                Value               Date                      CREATININE               0.95                07/04/2022                 K                        3.8                 07/04/2022                negative genitourinary   Musculoskeletal  (+) Arthritis , Osteoarthritis,    Abdominal   Peds  Hematology Sjogren's disease    Anesthesia Other Findings All: demerol, doxycycline sulfa, plaquenil, pantoprazole, ranitidine   Lupus- Cutaneous  Reproductive/Obstetrics                              Anesthesia Physical Anesthesia Plan  ASA: 3  Anesthesia Plan: Spinal   Post-op Pain Management: Minimal or no pain anticipated and Regional block*   Induction: Intravenous  PONV Risk Score and Plan: 4 or greater and Treatment may vary due to age or medical condition and Propofol infusion  Airway Management Planned: Natural Airway and Simple Face Mask  Additional Equipment: None  Intra-op Plan:   Post-operative Plan:   Informed Consent: I have  reviewed the patients History and Physical, chart, labs and discussed the procedure including the risks, benefits and alternatives for the proposed anesthesia with the patient or authorized representative who has indicated his/her understanding and acceptance.     Dental advisory given  Plan Discussed with: CRNA and Anesthesiologist  Anesthesia Plan Comments: (Spinal w R Adductor canal)         Anesthesia Quick Evaluation

## 2023-06-25 NOTE — Interval H&P Note (Signed)
History and Physical Interval Note:  06/25/2023 10:35 AM  Marie Calhoun  has presented today for surgery, with the diagnosis of Left knee osteoarthritis.  The various methods of treatment have been discussed with the patient and family. After consideration of risks, benefits and other options for treatment, the patient has consented to  Procedure(s): COMPUTER ASSISTED TOTAL KNEE ARTHROPLASTY (Left) as a surgical intervention.  The patient's history has been reviewed, patient examined, no change in status, stable for surgery.  I have reviewed the patient's chart and labs.  Questions were answered to the patient's satisfaction.     Iline Oven 

## 2023-06-25 NOTE — Transfer of Care (Signed)
Immediate Anesthesia Transfer of Care Note  Patient: Marie Calhoun  Procedure(s) Performed: COMPUTER ASSISTED TOTAL KNEE ARTHROPLASTY (Left: Knee)  Patient Location: PACU  Anesthesia Type:MAC and Spinal  Level of Consciousness: awake, alert , oriented, and patient cooperative  Airway & Oxygen Therapy: Patient Spontanous Breathing and Patient connected to face mask oxygen  Post-op Assessment: Report given to RN and Post -op Vital signs reviewed and stable  Post vital signs: Reviewed and stable  Last Vitals:  Vitals Value Taken Time  BP 141/94 06/25/23 1407  Temp    Pulse 89 06/25/23 1410  Resp 14 06/25/23 1410  SpO2 93 % 06/25/23 1410  Vitals shown include unfiled device data.  Last Pain:  Vitals:   06/25/23 1043  TempSrc: Oral         Complications: No notable events documented.

## 2023-06-25 NOTE — Evaluation (Signed)
Physical Therapy Evaluation Patient Details Name: Marie Calhoun MRN: [id number] DOB: [birth year] Today's Date: 06/25/2023  History of Present Illness  Pt is a 72 yo female presenting to therapy s/p L TKA on 06/25/2023. Pt PMH includes but is not limited to:  Anxiety & depression, Carcot-Marie-Tooth disease, GERD, Lupus, PONV, R-THA 05/30/22 and R-TKA on 07/17/22  Clinical Impression    Marie Calhoun is a 72 y.o. female POD 0 s/p L TKA. Patient reports mod I with mobility at baseline. Patient is now limited by functional impairments (see PT problem list below) and requires CGA and increased time for bed mobility and CGA and cues for transfers. Patient was able to ambulate 20 feet with RW in room per pt preference and CGA level of assist. Patient instructed in exercise to facilitate ROM and circulation to manage edema.  Patient will benefit from continued skilled PT interventions to address impairments and progress towards PLOF. Acute PT will follow to progress mobility and stair training in preparation for safe discharge home with strong social support and OPPT services.        If plan is discharge home, recommend the following: A little help with walking and/or transfers;A little help with bathing/dressing/bathroom;Assistance with cooking/housework;Help with stairs or ramp for entrance;Assist for transportation   Can travel by private vehicle        Equipment Recommendations None recommended by PT  Recommendations for Other Services       Functional Status Assessment Patient has had a recent decline in their functional status and demonstrates the ability to make significant improvements in function in a reasonable and predictable amount of time.     Precautions / Restrictions Precautions Precautions: Knee;Fall Restrictions Weight Bearing Restrictions: No      Mobility  Bed Mobility Overal bed mobility: Needs Assistance Bed Mobility: Supine to Sit     Supine to sit: Contact guard, HOB  elevated     General bed mobility comments: increased time and cues    Transfers Overall transfer level: Needs assistance Equipment used: Rolling walker (2 wheels) (personal RW) Transfers: Sit to/from Stand Sit to Stand: Contact guard assist, From elevated surface           General transfer comment: min cues and pt able to push to stand with B UEs    Ambulation/Gait Ambulation/Gait assistance: Contact guard assist Gait Distance (Feet): 20 Feet Assistive device: Rolling walker (2 wheels) Gait Pattern/deviations: Step-to pattern, Antalgic, Trunk flexed Gait velocity: decreased     General Gait Details: cues for safety with turns. L knee flexion t/o gait cycle  Stairs            Wheelchair Mobility     Tilt Bed    Modified Rankin (Stroke Patients Only)       Balance Overall balance assessment: Needs assistance Sitting-balance support: Feet supported Sitting balance-Leahy Scale: Good     Standing balance support: Bilateral upper extremity supported, During functional activity, Reliant on assistive device for balance Standing balance-Leahy Scale: Poor                               Pertinent Vitals/Pain Pain Assessment Pain Assessment: 0-10 Pain Score: 8  Pain Location: L knee Pain Descriptors / Indicators: Aching, Constant, Discomfort, Grimacing, Operative site guarding Pain Intervention(s): Limited activity within patient's tolerance, Monitored during session, Premedicated before session, Repositioned, Ice applied    Home Living Family/patient expects to be discharged to:: Private residence Living Arrangements:  Alone Available Help at Discharge: Friend(s);Available 24 hours/day;Personal care attendant;Neighbor Type of Home: House Home Access: Stairs to enter Entrance Stairs-Rails: Right Entrance Stairs-Number of Steps: 1   Home Layout: One level;Laundry or work area in [employer] Equipment: Gilmer Mor - single point;Other  (comment);Rollator (4 wheels);BSC/3in1;Shower seat;Hand held shower head;Shower seat - built Cytogeneticist (2 wheels)      Prior Function Prior Level of Function : Independent/Modified Independent             Mobility Comments: RW for mobility tasks in home, community B LE braces due to Carcot-Marie-Tooth disease and RW (B LE braces not in hospital setting at time of eval) ADLs Comments: ind     Extremity/Trunk Assessment        Lower Extremity Assessment Lower Extremity Assessment: LLE deficits/detail LLE Deficits / Details: ankle DF/PF 5/5; SLR > 10 degree lag LLE Sensation: WNL    Cervical / Trunk Assessment Cervical / Trunk Assessment:  (wfl)  Communication   Communication Communication: No apparent difficulties  Cognition Arousal: Alert Behavior During Therapy: WFL for tasks assessed/performed Overall Cognitive Status: Within Functional Limits for tasks assessed                                          General Comments      Exercises Total Joint Exercises Ankle Circles/Pumps: AROM, Both, 20 reps   Assessment/Plan    PT Assessment Patient needs continued PT services  PT Problem List Decreased strength;Decreased range of motion;Decreased activity tolerance;Decreased balance;Decreased mobility;Decreased coordination;Pain       PT Treatment Interventions DME instruction;Gait training;Stair training;Functional mobility training;Therapeutic activities;Therapeutic exercise;Balance training;Neuromuscular re-education;Patient/family education;Modalities    PT Goals (Current goals can be found in the Care Plan section)  Acute Rehab PT Goals Patient Stated Goal: to get back to walking PT Goal Formulation: With patient Time For Goal Achievement: 07/09/23 Potential to Achieve Goals: Good    Frequency 7X/week     Co-evaluation               AM-PAC PT "6 Clicks" Mobility  Outcome Measure Help needed turning from your back to  your side while in a flat bed without using bedrails?: A Little Help needed moving from lying on your back to sitting on the side of a flat bed without using bedrails?: A Little Help needed moving to and from a bed to a chair (including a wheelchair)?: A Little Help needed standing up from a chair using your arms (e.g., wheelchair or bedside chair)?: A Little Help needed to walk in hospital room?: A Little Help needed climbing 3-5 steps with a railing? : A Lot 6 Click Score: 17    End of Session Equipment Utilized During Treatment: Gait belt Activity Tolerance: Patient tolerated treatment well;Patient limited by pain Patient left: in chair;with call bell/phone within reach Nurse Communication: Mobility status PT Visit Diagnosis: Unsteadiness on feet (R26.81);Other abnormalities of gait and mobility (R26.89);Muscle weakness (generalized) (M62.81);Difficulty in walking, not elsewhere classified (R26.2);Pain Pain - Right/Left: Left Pain - part of body: Knee;Leg    Time: 1715-1741 PT Time Calculation (min) (ACUTE ONLY): 26 min   Charges:   PT Evaluation $PT Eval Low Complexity: 1 Low PT Treatments $Gait Training: 8-22 mins PT General Charges $$ ACUTE PT VISIT: 1 Visit         Johnny Bridge, PT Acute Rehab   Jacqualyn Posey 06/25/2023, 6:09 PM

## 2023-06-25 NOTE — Plan of Care (Signed)
  Problem: Education: Goal: Knowledge of the prescribed therapeutic regimen will improve Outcome: Progressing   Problem: Pain Management: Goal: Pain level will decrease with appropriate interventions Outcome: Progressing   Problem: Nutrition: Goal: Adequate nutrition will be maintained Outcome: Progressing   

## 2023-06-25 NOTE — Plan of Care (Signed)
  Problem: Activity: Goal: Ability to avoid complications of mobility impairment will improve Outcome: Progressing   Problem: Clinical Measurements: Goal: Postoperative complications will be avoided or minimized Outcome: Progressing   Problem: Pain Management: Goal: Pain level will decrease with appropriate interventions Outcome: Progressing   Problem: Safety: Goal: Ability to remain free from injury will improve Outcome: Progressing

## 2023-06-25 NOTE — Anesthesia Procedure Notes (Signed)
Procedure Name: MAC Date/Time: 06/25/2023 11:53 AM  Performed by: Elisabeth Cara, CRNAPre-anesthesia Checklist: Patient identified, Emergency Drugs available, Suction available, Patient being monitored and Timeout performed Patient Re-evaluated:Patient Re-evaluated prior to induction Oxygen Delivery Method: Simple face mask Placement Confirmation: positive ETCO2 Dental Injury: Teeth and Oropharynx as per pre-operative assessment

## 2023-06-25 NOTE — Anesthesia Procedure Notes (Signed)
Anesthesia Regional Block: Adductor canal block   Pre-Anesthetic Checklist: , timeout performed,  Correct Patient, Correct Site, Correct Laterality,  Correct Procedure, Correct Position, site marked,  Risks and benefits discussed,  Surgical consent,  Pre-op evaluation,  At surgeon's request and post-op pain management  Laterality: Left  Prep: chloraprep       Needles:  Injection technique: Single-shot  Needle Type: Echogenic Stimulator Needle     Needle Length: 10cm  Needle Gauge: 21   Needle insertion depth: 6 cm   Additional Needles:   Procedures:,,,, ultrasound used (permanent image in chart),,    Narrative:  Start time: 06/25/2023 10:46 AM End time: 06/25/2023 10:51 AM Injection made incrementally with aspirations every 5 mL.  Performed by: Personally  Anesthesiologist: Mal Amabile, MD  Additional Notes: Timeout performed. Patient sedated. Relevant anatomy ID'd using [place]. Incremental 2-31ml injection of LA with frequent aspiration. Patient tolerated procedure well.

## 2023-06-25 NOTE — Anesthesia Procedure Notes (Signed)
Spinal  Patient location during procedure: OR Start time: 06/25/2023 11:54 AM End time: 06/25/2023 11:58 AM Reason for block: surgical anesthesia Staffing Performed: anesthesiologist  Anesthesiologist: Mal Amabile, MD Performed by: Mal Amabile, MD Authorized by: Mal Amabile, MD   Preanesthetic Checklist Completed: patient identified, IV checked, site marked, risks and benefits discussed, surgical consent, monitors and equipment checked, pre-op evaluation and timeout performed Spinal Block Patient position: sitting Prep: DuraPrep and site prepped and draped Patient monitoring: heart rate, cardiac monitor, continuous pulse ox and blood pressure Approach: midline Location: L3-4 Injection technique: single-shot Needle Needle type: Pencan  Needle gauge: 24 G Needle length: 9 cm Needle insertion depth: 6 cm Assessment Sensory level: T4 Events: CSF return Additional Notes Patient tolerated procedure well. Adequate sensory level.

## 2023-06-25 NOTE — Anesthesia Postprocedure Evaluation (Signed)
Anesthesia Post Note  Patient: Collen Kephart  Procedure(s) Performed: COMPUTER ASSISTED TOTAL KNEE ARTHROPLASTY (Left: Knee)     Patient location during evaluation: PACU Anesthesia Type: Spinal Level of consciousness: awake Pain management: pain level controlled Vital Signs Assessment: post-procedure vital signs reviewed and stable Respiratory status: spontaneous breathing Cardiovascular status: stable Postop Assessment: no backache, no headache, spinal receding, patient able to bend at knees and no apparent nausea or vomiting Anesthetic complications: no  No notable events documented.  Last Vitals:  Vitals:   06/25/23 1534 06/25/23 1850  BP: (!) 145/88 (!) 138/94  Pulse: 91 87  Resp: 16 17  Temp: 36.8 C 36.6 C  SpO2: 92% 98%    Last Pain:  Vitals:   06/25/23 1700  TempSrc:   PainSc: 2                  Caren Macadam

## 2023-06-25 NOTE — Discharge Instructions (Signed)
 Dr.   Total Joint Specialist [facility] [address] [place], [place] [address] [phone]  TOTAL KNEE REPLACEMENT POSTOPERATIVE DIRECTIONS    Knee Rehabilitation, Guidelines Following Surgery  Results after knee surgery are often greatly improved when you follow the exercise, range of motion and muscle strengthening exercises prescribed by your doctor. Safety measures are also important to protect the knee from further injury. Any time any of these exercises cause you to have increased pain or swelling in your knee joint, decrease the amount until you are comfortable again and slowly increase them. If you have problems or questions, call your caregiver or physical therapist for advice.   WEIGHT BEARING Weight bearing as tolerated with assist device (walker, cane, etc) as directed, use it as long as suggested by your surgeon or therapist, typically at least 4-6 weeks.  HOME CARE INSTRUCTIONS  Remove items at home which could result in a fall. This includes throw rugs or furniture in walking pathways.  Continue medications as instructed at time of discharge. You may have some home medications which will be placed on hold until you complete the course of blood thinner medication.  You may start showering once you are discharged home but do not submerge the incision under water. Just pat the incision dry and apply a dry gauze dressing on daily. Walk with walker as instructed.  You may resume a sexual relationship in one month or when given the OK by your doctor.  Use walker as long as suggested by your caregivers. Avoid periods of inactivity such as sitting longer than an hour when not asleep. This helps prevent blood clots.  You may put full weight on your legs and walk as much as is comfortable.  You may return to work once you are cleared by your doctor.  Do not drive a car for 6 weeks or until released by you surgeon.  Do not drive while  taking narcotics.  Wear the elastic stockings for three weeks following surgery during the day but you may remove then at night. Make sure you keep all of your appointments after your operation with all of your doctors and caregivers. You should call the office at the above phone number and make an appointment for approximately two weeks after the date of your surgery. Do not remove your surgical dressing. The dressing is waterproof; you may take showers in 3 days, but do not take tub baths or submerge the dressing. Please pick up a stool softener and laxative for home use as long as you are requiring pain medications. ICE to the affected knee every three hours for 30 minutes at a time and then as needed for pain and swelling.  Continue to use ice on the knee for pain and swelling from surgery. You may notice swelling that will progress down to the foot and ankle.  This is normal after surgery.  Elevate the leg when you are not up walking on it.   It is important for you to complete the blood thinner medication as prescribed by your doctor. Continue to use the breathing machine which will help keep your temperature down.  It is common for your temperature to cycle up and down following surgery, especially at night when you are not up moving around and exerting yourself.  The breathing machine keeps your lungs expanded and your temperature down.  RANGE OF MOTION AND STRENGTHENING EXERCISES  Rehabilitation of the knee is important following a knee injury or an   operation. After just a few days of immobilization, the muscles of the thigh which control the knee become weakened and shrink (atrophy). Knee exercises are designed to build up the tone and strength of the thigh muscles and to improve knee motion. Often times heat used for twenty to thirty minutes before working out will loosen up your tissues and help with improving the range of motion but do not use heat for the first two weeks following surgery.  These exercises can be done on a training (exercise) mat, on the floor, on a table or on a bed. Use what ever works the best and is most comfortable for you Knee exercises include:  Leg Lifts - While your knee is still immobilized in a splint or cast, you can do straight leg raises. Lift the leg to 60 degrees, hold for 3 sec, and slowly lower the leg. Repeat 10-20 times 2-3 times daily. Perform this exercise against resistance later as your knee gets better.  Quad and Hamstring Sets - Tighten up the muscle on the front of the thigh (Quad) and hold for 5-10 sec. Repeat this 10-20 times hourly. Hamstring sets are done by pushing the foot backward against an object and holding for 5-10 sec. Repeat as with quad sets.  A rehabilitation program following serious knee injuries can speed recovery and prevent re-injury in the future due to weakened muscles. Contact your doctor or a physical therapist for more information on knee rehabilitation.   POST-OPERATIVE OPIOID TAPER INSTRUCTIONS: It is important to wean off of your opioid medication as soon as possible. If you do not need pain medication after your surgery it is ok to stop day one. Opioids include: Codeine, Hydrocodone(Norco, Vicodin), Oxycodone(Percocet, oxycontin) and hydromorphone amongst others.  Long term and even short term use of opiods can cause: Increased pain response Dependence Constipation Depression Respiratory depression And more.  Withdrawal symptoms can include Flu like symptoms Nausea, vomiting And more Techniques to manage these symptoms Hydrate well Eat regular healthy meals Stay active Use relaxation techniques(deep breathing, meditating, yoga) Do Not substitute Alcohol to help with tapering If you have been on opioids for less than two weeks and do not have pain than it is ok to stop all together.  Plan to wean off of opioids This plan should start within one week post op of your joint replacement. Maintain the same  interval or time between taking each dose and first decrease the dose.  Cut the total daily intake of opioids by one tablet each day Next start to increase the time between doses. The last dose that should be eliminated is the evening dose.    SKILLED REHAB INSTRUCTIONS: If the patient is transferred to a skilled rehab facility following release from the hospital, a list of the current medications will be sent to the facility for the patient to continue.  When discharged from the skilled rehab facility, please have the facility set up the patient's Home Health Physical Therapy prior to being released. Also, the skilled facility will be responsible for providing the patient with their medications at time of release from the facility to include their pain medication, the muscle relaxants, and their blood thinner medication. If the patient is still at the rehab facility at time of the two week follow up appointment, the skilled rehab facility will also need to assist the patient in arranging follow up appointment in our office and any transportation needs.  MAKE SURE YOU:  Understand these instructions.  Will watch   your condition.  Will get help right away if you are not doing well or get worse.    Pick up stool softner and laxative for home use following surgery while on pain medications. Do NOT remove your dressing. You may shower.  Do not take tub baths or submerge incision under water. May shower starting three days after surgery. Please use a clean towel to pat the incision dry following showers. Continue to use ice for pain and swelling after surgery. Do not use any lotions or creams on the incision until instructed by your surgeon.  

## 2023-06-26 LAB — BASIC METABOLIC PANEL
Anion gap: 7 (ref 5–15)
BUN: 14 mg/dL (ref 8–23)
CO2: 25 mmol/L (ref 22–32)
Calcium: 9 mg/dL (ref 8.9–10.3)
Chloride: 104 mmol/L (ref 98–111)
Creatinine, Ser: 0.96 mg/dL (ref 0.44–1.00)
GFR, Estimated: >60 mL/min (ref >60)
Glucose, Bld: 134 mg/dL — ABNORMAL HIGH (ref 70–99)
Potassium: 3.5 mmol/L (ref 3.5–5.1)
Sodium: 136 mmol/L (ref 135–145)

## 2023-06-26 LAB — CBC
HCT: 35.4 % — ABNORMAL LOW (ref 36.0–46.0)
Hemoglobin: 12 g/dL (ref 12.0–15.0)
MCH: 34.4 pg — ABNORMAL HIGH (ref 26.0–34.0)
MCHC: 33.9 g/dL (ref 30.0–36.0)
MCV: 101.4 fL — ABNORMAL HIGH (ref 80.0–100.0)
Platelets: 216 10*3/uL (ref 150–400)
RBC: 3.49 MIL/uL — ABNORMAL LOW (ref 3.87–5.11)
RDW: 12.8 % (ref 11.5–15.5)
WBC: 8 10*3/uL (ref 4.0–10.5)
nRBC: 0 % (ref 0.0–0.2)

## 2023-06-26 MED ORDER — LORAZEPAM 1 MG PO TABS
1.0000 mg | ORAL_TABLET | Freq: Four times a day (QID) | ORAL | Status: DC | PRN
  Administered 2023-06-26: 1 mg via ORAL
  Filled 2023-06-26: qty 1

## 2023-06-26 MED ORDER — OXYCODONE HCL 5 MG PO TABS: 5.0000 mg | ORAL_TABLET | ORAL | Status: DC | PRN

## 2023-06-26 MED ORDER — OXYCODONE HCL 5 MG PO TABS
10.0000 mg | ORAL_TABLET | ORAL | Status: DC | PRN
  Administered 2023-06-26 – 2023-06-27 (×4): 10 mg via ORAL
  Filled 2023-06-26 (×4): qty 2

## 2023-06-26 MED ORDER — SODIUM CHLORIDE 0.9 % IV SOLN: INTRAVENOUS | Status: DC

## 2023-06-26 MED ORDER — ACETAMINOPHEN 500 MG PO TABS
1000.0000 mg | ORAL_TABLET | Freq: Three times a day (TID) | ORAL | Status: DC | PRN
  Administered 2023-06-27: 1000 mg via ORAL
  Filled 2023-06-26: qty 2

## 2023-06-26 NOTE — Progress Notes (Addendum)
Paged [facility]  Eudelia Bunch PA on call for patient having elevated b/p 177/109 and HR, 125. Patient states that she is not currently in pain.   PA says keep watch but will try ativan 1mg  q 6 hrs for anxiety. New order with read back

## 2023-06-26 NOTE — TOC Transition Note (Signed)
Transition of Care [facility]) - CM/SW Discharge Note   Patient Details  Name: [name] MRN: [id number] Date of Birth: 1951-06-27  [facility] CM/SW Contact:  Amada Jupiter, LCSW Phone Number: 06/26/2023, 9:41 AM   Clinical Narrative:     Met with pt who confirms she has needed DME in the home.   OPPT already set up with [facility].  No further TOC needs.  Final next level of care: OP Rehab Barriers to Discharge: No Barriers Identified   Patient Goals and CMS Choice      Discharge Placement                         Discharge Plan and Services Additional resources added to the After Visit Summary for                  DME Arranged: N/A DME Agency: NA                  Social Determinants of Health (SDOH) Interventions SDOH Screenings   Food Insecurity: No Food Insecurity (06/25/2023)  Housing: Low Risk  (06/25/2023)  Transportation Needs: No Transportation Needs (06/25/2023)  Utilities: Not At Risk (06/25/2023)  Tobacco Use: Low Risk  (06/25/2023)     Readmission Risk Interventions     No data to display

## 2023-06-26 NOTE — Progress Notes (Signed)
Physical Therapy Treatment Patient Details Name: Marie Calhoun MRN: [id number] DOB: [birth year] Today's Date: 06/26/2023   History of Present Illness Pt is a 72 yo female presenting to therapy s/p L TKA on 06/25/2023. 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  Pt was in bed.  Assisted OOb required increased time.  General bed mobility comments: demonstarted and instructed how to use a belt to self assist LE.  General transfer comment: min cues and pt able to push to stand with B UEs from an elevated bed.  General Gait Details: decreased amb distance due to increased c/o pain and fatigue.  Pt reports "poor sleep last night".  "I don't think I can walk in the hallway".  Assisted with amb to door and back a total of 14 feet.  Assisted back to bed per pt request.  Unable to tolerate any TE's due to increased pain, ICE was applied. Does NOT look favorable to D/C to home today due to fatigue level and limited amb distance.     If plan is discharge home, recommend the following: A little help with walking and/or transfers;A little help with bathing/dressing/bathroom;Assistance with cooking/housework;Help with stairs or ramp for entrance;Assist for transportation   Can travel by private vehicle        Equipment Recommendations  None recommended by PT    Recommendations for Other Services       Precautions / Restrictions Precautions Precautions: Knee;Fall Precaution Comments: no pillow under knee Restrictions Weight Bearing Restrictions: No     Mobility  Bed Mobility Overal bed mobility: Needs Assistance Bed Mobility: Supine to Sit, Sit to Supine     Supine to sit: Contact guard, Supervision Sit to supine: Contact guard assist, Min assist   General bed  mobility comments: demonstarted and instructed how to use a belt to self assist LE    Transfers Overall transfer level: Needs assistance Equipment used: Rolling walker (2 wheels) Transfers: Sit to/from Stand Sit to Stand: Contact guard assist, From elevated surface           General transfer comment: min cues and pt able to push to stand with B UEs from an elevated bed    Ambulation/Gait Ambulation/Gait assistance: Contact guard assist Gait Distance (Feet): 14 Feet Assistive device: Rolling walker (2 wheels) Gait Pattern/deviations: Step-to pattern, Antalgic, Trunk flexed Gait velocity: decreased     General Gait Details: decreased amb distance due to increased c/o pain and fatigue.  Pt reports "poor sleep last night".  "I don't think I can walk in the hallway".  Assisted with amb to door and back a total of 14 feet.   Stairs             Wheelchair Mobility     Tilt Bed    Modified Rankin (Stroke Patients Only)       Balance                                            Cognition Arousal: Alert Behavior During Therapy: WFL for tasks assessed/performed Overall Cognitive Status: Within Functional Limits for tasks assessed  General Comments: AxO x 3 pleasant Lady having had prior Total Joint Repacements but c/o increased pain today.        Exercises      General Comments        Pertinent Vitals/Pain Pain Assessment Pain Assessment: 0-10 Pain Score: 5  Pain Location: L knee Pain Descriptors / Indicators: Aching, Constant, Discomfort, Grimacing, Operative site guarding Pain Intervention(s): Monitored during session, Premedicated before session, Repositioned, Ice applied    Home Living                          Prior Function            PT Goals (current goals can now be found in the care plan section) Progress towards PT goals: Progressing toward goals     Frequency    7X/week      PT Plan      Co-evaluation              AM-PAC PT "6 Clicks" Mobility   Outcome Measure  Help needed turning from your back to your side while in a flat bed without using bedrails?: A Little Help needed moving from lying on your back to sitting on the side of a flat bed without using bedrails?: A Little Help needed moving to and from a bed to a chair (including a wheelchair)?: A Little Help needed standing up from a chair using your arms (e.g., wheelchair or bedside chair)?: A Little Help needed to walk in hospital room?: A Little Help needed climbing 3-5 steps with a railing? : A Lot 6 Click Score: 17    End of Session Equipment Utilized During Treatment: Gait belt Activity Tolerance: Patient limited by fatigue;Patient limited by pain Patient left: in bed;with call bell/phone within reach;with bed alarm set   PT Visit Diagnosis: Unsteadiness on feet (R26.81);Other abnormalities of gait and mobility (R26.89);Muscle weakness (generalized) (M62.81);Difficulty in walking, not elsewhere classified (R26.2);Pain Pain - Right/Left: Left Pain - part of body: Knee;Leg     Time: 3810-1751 PT Time Calculation (min) (ACUTE ONLY): 20 min  Charges:    $Gait Training: 8-22 mins PT General Charges $$ ACUTE PT VISIT: 1 Visit                     {   PTA Acute  Rehabilitation Services Office M-F          [phone]

## 2023-06-26 NOTE — Progress Notes (Signed)
    Subjective:  Patient reports pain as moderate to severe.  Denies N/V/CP/SOB. C/o L knee pain.  Objective:   VITALS:   Vitals:   06/25/23 2228 06/26/23 0219 06/26/23 0631 06/26/23 1027  BP: 138/87 (!) 150/91 (!) 165/95 138/87  Pulse: 83 78 90 (!) 102  Resp: 14 15 15 20   Temp: 98 F (36.7 C) 97.7 F (36.5 C) 98.3 F (36.8 C) 98.7 F (37.1 C)  TempSrc: Oral  Oral Oral  SpO2: 96% 96% 96% 95%  Weight:      Height:        NAD ABD soft Sensation intact distally Intact pulses distally Dorsiflexion/Plantar flexion intact Incision: dressing C/D/I Compartment soft   Lab Results  Component Value Date   WBC 4.2 06/17/2023   HGB 12.7 06/17/2023   HCT 37.8 06/17/2023   MCV 102.4 (H) 06/17/2023   PLT 221 06/17/2023   BMET    Component Value Date/Time   NA 140 06/17/2023 1309   K 2.8 (L) 06/17/2023 1309   CL 109 06/17/2023 1309   CO2 22 06/17/2023 1309   GLUCOSE 148 (H) 06/17/2023 1309   BUN 14 06/17/2023 1309   CREATININE 1.36 (H) 06/17/2023 1309   CALCIUM 9.0 06/17/2023 1309   GFRNONAA 42 (L) 06/17/2023 1309     Assessment/Plan: 1 Day Post-Op   Principal Problem:   Osteoarthritis of left knee   WBAT with walker DVT ppx: Aspirin, SCDs, TEDS PO pain control: d/c hydrocodone, start oxy 5-10 mg every 4 hrs PRN & tylenol 1000 mg every 8 hrs PRN PT/OT Will order labs Dispo: d/c home once pain is controlled and she clears PT, likely tomorrow    Iline Oven  06/26/2023, 10:46 AM   Samson Frederic, MD [phone] [facility] is now [facility]  [place] [address], [place], [place] [address] Phone: [phone] [email or web address] Facebook  [employer]

## 2023-06-26 NOTE — Progress Notes (Signed)
Physical Therapy Treatment Patient Details Name: Marie Calhoun MRN: [id number] DOB: [birth year] Today's Date: 06/26/2023   History of Present Illness Pt is a 72 yo female presenting to therapy s/p L TKA on 06/25/2023. 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  Assisted OOB to amb to bathroom then a short distance around room.  Pt did well using her strap to self guide LE off and back onto bed.  Positioned to comfort then applied ICE.  Pt did NOT meet her mobility goals and plans to stay one more night.    If plan is discharge home, recommend the following: A little help with walking and/or transfers;A little help with bathing/dressing/bathroom;Assistance with cooking/housework;Help with stairs or ramp for entrance;Assist for transportation   Can travel by private vehicle        Equipment Recommendations  None recommended by PT    Recommendations for Other Services       Precautions / Restrictions Precautions Precautions: Knee;Fall Precaution Comments: no pillow under knee Restrictions Weight Bearing Restrictions: No     Mobility  Bed Mobility Overal bed mobility: Needs Assistance Bed Mobility: Supine to Sit, Sit to Supine     Supine to sit: Supervision Sit to supine: Supervision   General bed mobility comments: demonstarted and instructed how to use a belt to self assist LE    Transfers Overall transfer level: Needs assistance Equipment used: Rolling walker (2 wheels) Transfers: Sit to/from Stand Sit to Stand: Supervision           General transfer comment: pt able to push to stand with B UEs from an elevated bed at Supervision.  Also assisted with a toilet transfer.    Ambulation/Gait Ambulation/Gait assistance: Supervision, Contact guard assist Gait Distance (Feet): 22  Feet Assistive device: Rolling walker (2 wheels) Gait Pattern/deviations: Step-to pattern, Antalgic, Trunk flexed Gait velocity: decreased     General Gait Details: feeling a little better after a nap.  Assisted with amb in room only to and from bathroom.  "I just can't walk in the hallway just yet".  Pt c/o poor sleep last night and increased pain.   Stairs             Wheelchair Mobility     Tilt Bed    Modified Rankin (Stroke Patients Only)       Balance                                            Cognition Arousal: Alert Behavior During Therapy: WFL for tasks assessed/performed Overall Cognitive Status: Within Functional Limits for tasks assessed                                 General Comments: AxO x 3 pleasant Lady having had prior Total Joint Repacements but c/o increased pain today.        Exercises      General Comments        Pertinent Vitals/Pain Pain Assessment Pain Assessment: 0-10 Pain Score: 5  Pain Location: L knee Pain Descriptors / Indicators: Aching, Constant, Discomfort, Grimacing, Operative site guarding Pain Intervention(s): Monitored during session,  Premedicated before session, Repositioned, Ice applied    Home Living                          Prior Function            PT Goals (current goals can now be found in the care plan section) Progress towards PT goals: Progressing toward goals    Frequency    7X/week      PT Plan      Co-evaluation              AM-PAC PT "6 Clicks" Mobility   Outcome Measure  Help needed turning from your back to your side while in a flat bed without using bedrails?: A Little Help needed moving from lying on your back to sitting on the side of a flat bed without using bedrails?: A Little Help needed moving to and from a bed to a chair (including a wheelchair)?: A Little Help needed standing up from a chair using your arms (e.g., wheelchair  or bedside chair)?: A Little Help needed to walk in hospital room?: A Little Help needed climbing 3-5 steps with a railing? : A Lot 6 Click Score: 17    End of Session Equipment Utilized During Treatment: Gait belt Activity Tolerance: Patient limited by fatigue;Patient limited by pain Patient left: in bed;with call bell/phone within reach;with bed alarm set   PT Visit Diagnosis: Unsteadiness on feet (R26.81);Other abnormalities of gait and mobility (R26.89);Muscle weakness (generalized) (M62.81);Difficulty in walking, not elsewhere classified (R26.2);Pain Pain - Right/Left: Left Pain - part of body: Knee;Leg     Time: 1610-9604 PT Time Calculation (min) (ACUTE ONLY): 25 min  Charges:    $Gait Training: 8-22 mins $Therapeutic Activity: 8-22 mins PT General Charges $$ ACUTE PT VISIT: 1 Visit                     Felecia Shelling  PTA Acute  Rehabilitation Services Office M-F          [phone]

## 2023-06-27 DIAGNOSIS — Z96651 Presence of right artificial knee joint: Secondary | ICD-10-CM | POA: Diagnosis present

## 2023-06-27 DIAGNOSIS — E876 Hypokalemia: Secondary | ICD-10-CM | POA: Diagnosis present

## 2023-06-27 DIAGNOSIS — M329 Systemic lupus erythematosus, unspecified: Secondary | ICD-10-CM | POA: Diagnosis present

## 2023-06-27 DIAGNOSIS — Z881 Allergy status to other antibiotic agents status: Secondary | ICD-10-CM | POA: Diagnosis not present

## 2023-06-27 DIAGNOSIS — Z7952 Long term (current) use of systemic steroids: Secondary | ICD-10-CM | POA: Diagnosis not present

## 2023-06-27 DIAGNOSIS — Z882 Allergy status to sulfonamides status: Secondary | ICD-10-CM | POA: Diagnosis not present

## 2023-06-27 DIAGNOSIS — M3500 Sicca syndrome, unspecified: Secondary | ICD-10-CM | POA: Diagnosis present

## 2023-06-27 DIAGNOSIS — Z96641 Presence of right artificial hip joint: Secondary | ICD-10-CM | POA: Diagnosis present

## 2023-06-27 DIAGNOSIS — Z85828 Personal history of other malignant neoplasm of skin: Secondary | ICD-10-CM | POA: Diagnosis not present

## 2023-06-27 DIAGNOSIS — Z79899 Other long term (current) drug therapy: Secondary | ICD-10-CM | POA: Diagnosis not present

## 2023-06-27 DIAGNOSIS — Z888 Allergy status to other drugs, medicaments and biological substances status: Secondary | ICD-10-CM | POA: Diagnosis not present

## 2023-06-27 DIAGNOSIS — M1712 Unilateral primary osteoarthritis, left knee: Secondary | ICD-10-CM | POA: Diagnosis present

## 2023-06-27 DIAGNOSIS — K219 Gastro-esophageal reflux disease without esophagitis: Secondary | ICD-10-CM | POA: Diagnosis present

## 2023-06-27 DIAGNOSIS — F418 Other specified anxiety disorders: Secondary | ICD-10-CM | POA: Diagnosis present

## 2023-06-27 DIAGNOSIS — Z885 Allergy status to narcotic agent status: Secondary | ICD-10-CM | POA: Diagnosis not present

## 2023-06-27 DIAGNOSIS — Z90711 Acquired absence of uterus with remaining cervical stump: Secondary | ICD-10-CM | POA: Diagnosis not present

## 2023-06-27 DIAGNOSIS — J45909 Unspecified asthma, uncomplicated: Secondary | ICD-10-CM | POA: Diagnosis present

## 2023-06-27 DIAGNOSIS — I10 Essential (primary) hypertension: Secondary | ICD-10-CM | POA: Diagnosis present

## 2023-06-27 DIAGNOSIS — E7800 Pure hypercholesterolemia, unspecified: Secondary | ICD-10-CM | POA: Diagnosis present

## 2023-06-27 MED ORDER — LOSARTAN POTASSIUM 50 MG PO TABS
50.0000 mg | ORAL_TABLET | Freq: Every day | ORAL | Status: DC
  Administered 2023-06-27: 50 mg via ORAL
  Filled 2023-06-27: qty 1

## 2023-06-27 MED ORDER — POLYETHYLENE GLYCOL 3350 17 G PO PACK: 17.0000 g | PACK | Freq: Every day | ORAL | 0 refills | Status: DC | PRN

## 2023-06-27 MED ORDER — OXYCODONE HCL 10 MG PO TABS: 10.0000 mg | ORAL_TABLET | ORAL | 0 refills | Status: AC | PRN

## 2023-06-27 MED ORDER — METHOCARBAMOL 500 MG PO TABS: 500.0000 mg | ORAL_TABLET | Freq: Four times a day (QID) | ORAL | 0 refills | Status: AC | PRN

## 2023-06-27 MED ORDER — ASPIRIN 81 MG PO CHEW: 81.0000 mg | CHEWABLE_TABLET | Freq: Two times a day (BID) | ORAL | 0 refills | Status: AC

## 2023-06-27 MED ORDER — DOCUSATE SODIUM 100 MG PO CAPS: 100.0000 mg | ORAL_CAPSULE | Freq: Two times a day (BID) | ORAL | 0 refills | Status: AC

## 2023-06-27 MED ORDER — ONDANSETRON HCL 4 MG PO TABS: 4.0000 mg | ORAL_TABLET | Freq: Four times a day (QID) | ORAL | 0 refills | Status: DC | PRN

## 2023-06-27 MED ORDER — HYDROCHLOROTHIAZIDE 12.5 MG PO TABS
12.5000 mg | ORAL_TABLET | Freq: Every day | ORAL | Status: DC
  Administered 2023-06-27: 12.5 mg via ORAL
  Filled 2023-06-27: qty 1

## 2023-06-27 MED ORDER — LISINOPRIL 20 MG PO TABS: 50.0000 mg | ORAL_TABLET | Freq: Every day | ORAL | Status: DC

## 2023-06-27 MED ORDER — SENNA 8.6 MG PO TABS: 2.0000 | ORAL_TABLET | Freq: Every day | ORAL | 0 refills | Status: AC

## 2023-06-27 MED ORDER — POTASSIUM CHLORIDE CRYS ER 20 MEQ PO TBCR
30.0000 meq | EXTENDED_RELEASE_TABLET | Freq: Two times a day (BID) | ORAL | Status: DC
  Administered 2023-06-27: 30 meq via ORAL
  Filled 2023-06-27: qty 1

## 2023-06-27 NOTE — Discharge Summary (Signed)
Physician Discharge Summary  Patient ID: Marie Calhoun MRN: [id number] DOB/AGE: 12/14/1950 72 y.o.  Admit date: 06/25/2023 Discharge date: 06/27/2023  Admission Diagnoses:  Osteoarthritis of left knee  Discharge Diagnoses:  Principal Problem:   Osteoarthritis of left knee   Past Medical History:  Diagnosis Date   Anxiety    Arthritis    Asthma 05/28/2017   Cancer (HCC)    skin cancer   Charcot-Marie-Tooth disease    Depression    Dyspnea    with exertion   Environmental allergies 05/28/2017   Esophageal stricture 05/28/2017   Essential hypertension, benign 05/28/2017   GERD (gastroesophageal reflux disease) 05/28/2017   Hypercholesteremia    Lupus (HCC)    of skin   PONV (postoperative nausea and vomiting)    Seasonal allergies    Sjogren's syndrome (HCC)     Surgeries: Procedure(s): COMPUTER ASSISTED TOTAL KNEE ARTHROPLASTY on 06/25/2023   Consultants (if any):   Discharged Condition: Improved  Hospital Course: Emmalin Wettlaufer is an 72 y.o. female who was admitted 06/25/2023 with a diagnosis of Osteoarthritis of left knee and went to the operating room on 06/25/2023 and underwent the above named procedures.    She was given perioperative antibiotics:  Anti-infectives (From admission, onward)    Start     Dose/Rate Route Frequency Ordered Stop   06/25/23 1800  ceFAZolin (ANCEF) IVPB 2g/100 mL premix        2 g 200 mL/hr over 30 Minutes Intravenous Every 6 hours 06/25/23 1534 06/26/23 0223   06/25/23 1534  acyclovir (ZOVIRAX) tablet 400 mg        400 mg Oral 3 times daily PRN 06/25/23 1534     06/25/23 1332  vancomycin (VANCOCIN) powder  Status:  Discontinued          As needed 06/25/23 1332 06/25/23 1528   06/25/23 0915  ceFAZolin (ANCEF) IVPB 2g/100 mL premix        2 g 200 mL/hr over 30 Minutes Intravenous On call to O.R. 06/25/23 0911 06/25/23 1229       She was given sequential compression devices, early ambulation, and ASA for DVT prophylaxis.  On POD#1,  her pain was poorly controlled, so her PO regimen was changed to oxy IR 10 mg and tylenol 1000 mg.  On POD#2, her home BP meds were resumed due to elevated BP. Oral K+ was given for hypokalemia.  She benefited maximally from the hospital stay and there were no complications.    Recent vital signs:  Vitals:   06/27/23 0455 06/27/23 0729  BP: (!) 142/92 (!) 161/90  Pulse: (!) 129 (!) 122  Resp: 17 (!) 24  Temp: 98.3 F (36.8 C) 98.7 F (37.1 C)  SpO2: 92% 95%    Recent laboratory studies:  Lab Results  Component Value Date   HGB 11.7 (L) 06/27/2023   HGB 12.0 06/26/2023   HGB 12.7 06/17/2023   Lab Results  Component Value Date   WBC 10.7 (H) 06/27/2023   PLT 216 06/27/2023   No results found for: "INR" Lab Results  Component Value Date   NA 133 (L) 06/27/2023   K 3.1 (L) 06/27/2023   CL 100 06/27/2023   CO2 22 06/27/2023   BUN 11 06/27/2023   CREATININE 0.77 06/27/2023   GLUCOSE 115 (H) 06/27/2023     Allergies as of 06/27/2023       Reactions   Doxycycline Itching, Rash   Demerol [meperidine] Nausea Only   Pantoprazole Other (See Comments)  Subacute Cutaneous Lupus of the Skin   Plaquenil [hydroxychloroquine]    Caused another Rash on top of Lupus Rash   Sulfa Antibiotics Swelling   *Childhood*   Sulfamethoxazole-trimethoprim Other (See Comments)   Childhood    Ranitidine Rash        Medication List     STOP taking these medications    methotrexate 2.5 MG tablet Commonly known as: RHEUMATREX       TAKE these medications    acetaminophen 500 MG tablet Commonly known as: TYLENOL Take 2 tablets (1,000 mg total) by mouth every 8 (eight) hours as needed for mild pain, moderate pain or fever.   acyclovir 400 MG tablet Commonly known as: ZOVIRAX Take 400 mg by mouth 3 (three) times daily as needed (for cold sores/fever blisters (takes for 7 days when needed)).   albuterol 108 (90 Base) MCG/ACT inhaler Commonly known as: VENTOLIN HFA Inhale 2  puffs into the lungs every 6 (six) hours as needed for wheezing or shortness of breath.   amoxicillin 500 MG capsule Commonly known as: AMOXIL Take 2,000 mg by mouth See admin instructions. Take 1 hour prior to dental work   aspirin 81 MG chewable tablet Chew 1 tablet (81 mg total) by mouth 2 (two) times daily.   B-12 2500 MCG Tabs Take 2,500 mcg by mouth daily.   CALCIUM 600 + D PO Take 1 tablet by mouth 2 (two) times daily.   cycloSPORINE 0.05 % ophthalmic emulsion Commonly known as: RESTASIS Place 1 drop into both eyes 2 (two) times daily.   docusate sodium 100 MG capsule Commonly known as: COLACE Take 1 capsule (100 mg total) by mouth 2 (two) times daily.   fluticasone 50 MCG/ACT nasal spray Commonly known as: FLONASE USE 1 TO 2 SPRAYS IN EACH NOSTRIL EVERY DAY AS NEEDED FOR ALLERGIES.   folic acid 1 MG tablet Commonly known as: FOLVITE Take 1 mg by mouth daily.   gabapentin 300 MG capsule Commonly known as: NEURONTIN Take 300 mg by mouth 3 (three) times daily.   ketoconazole 2 % cream Commonly known as: NIZORAL Apply 1 Application topically daily as needed (fungus). Apply to nail and finger around affected area   lansoprazole 30 MG capsule Commonly known as: PREVACID TAKE 1 CAPSULE BY MOUTH ONCE DAILY ON AN EMPTY STOMACH 30-45 MINUTES BEFORE BREAKFAST   linaclotide 145 MCG Caps capsule Commonly known as: LINZESS Take 1 capsule (145 mcg total) by mouth daily.   losartan-hydrochlorothiazide 50-12.5 MG tablet Commonly known as: HYZAAR Take 0.5 tablets by mouth at bedtime.   methocarbamol 500 MG tablet Commonly known as: ROBAXIN Take 1 tablet (500 mg total) by mouth every 6 (six) hours as needed for up to 7 days for muscle spasms.   mineral oil light external liquid Apply 1 Application topically daily as needed (In ears of needed).   mirabegron ER 25 MG Tb24 tablet Commonly known as: MYRBETRIQ Take 1 tablet (25 mg total) by mouth daily.   multivitamin  with minerals Tabs tablet Take 1 tablet by mouth in the morning. Woman's   ondansetron 4 MG tablet Commonly known as: ZOFRAN Take 1 tablet (4 mg total) by mouth every 6 (six) hours as needed for nausea.   Oxycodone HCl 10 MG Tabs Take 1 tablet (10 mg total) by mouth every 4 (four) hours as needed for up to 7 days for severe pain.   polyethylene glycol 17 g packet Commonly known as: MIRALAX / GLYCOLAX Take 17 g by  mouth daily as needed for mild constipation.   predniSONE 10 MG tablet Commonly known as: DELTASONE Take 10 mg by mouth daily.   senna 8.6 MG Tabs tablet Commonly known as: SENOKOT Take 2 tablets (17.2 mg total) by mouth at bedtime.   silver sulfADIAZINE 1 % cream Commonly known as: SILVADENE Apply 1 application topically daily as needed (foot wounds/sores).   triamcinolone cream 0.1 % Commonly known as: KENALOG Apply 1 Application topically 3 (three) times daily as needed for irritation or itching.          WEIGHT BEARING   {TOM Weight Bearing:[id number]}   EXERCISES  Results after joint replacement surgery are often greatly improved when you follow the exercise, range of motion and muscle strengthening exercises prescribed by your doctor. Safety measures are also important to protect the joint from further injury. Any time any of these exercises cause you to have increased pain or swelling, decrease what you are doing until you are comfortable again and then slowly increase them. If you have problems or questions, call your caregiver or physical therapist for advice.   Rehabilitation is important following a joint replacement. After just a few days of immobilization, the muscles of the leg can become weakened and shrink (atrophy).  These exercises are designed to build up the tone and strength of the thigh and leg muscles and to improve motion. Often times heat used for twenty to thirty minutes before working out will loosen up your tissues and help with  improving the range of motion but do not use heat for the first two weeks following surgery (sometimes heat can increase post-operative swelling).   These exercises can be done on a training (exercise) mat, on the floor, on a table or on a bed. Use whatever works the best and is most comfortable for you.    Use music or television while you are exercising so that the exercises are a pleasant break in your day. This will make your life better with the exercises acting as a break in your routine that you can look forward to.   Perform all exercises about fifteen times, three times per day or as directed.  You should exercise both the operative leg and the other leg as well.  Exercises include:   Quad Sets - Tighten up the muscle on the front of the thigh (Quad) and hold for 5-10 seconds.   Straight Leg Raises - With your knee straight (if you were given a brace, keep it on), lift the leg to 60 degrees, hold for 3 seconds, and slowly lower the leg.  Perform this exercise against resistance later as your leg gets stronger.  Leg Slides: Lying on your back, slowly slide your foot toward your buttocks, bending your knee up off the floor (only go as far as is comfortable). Then slowly slide your foot back down until your leg is flat on the floor again.  Angel Wings: Lying on your back spread your legs to the side as far apart as you can without causing discomfort.  Hamstring Strength:  Lying on your back, push your heel against the floor with your leg straight by tightening up the muscles of your buttocks.  Repeat, but this time bend your knee to a comfortable angle, and push your heel against the floor.  You may put a pillow under the heel to make it more comfortable if necessary.   A rehabilitation program following joint replacement surgery can speed recovery and prevent re-injury in  the future due to weakened muscles. Contact your doctor or a physical therapist for more information on knee rehabilitation.     CONSTIPATION  Constipation is defined medically as fewer than three stools per week and severe constipation as less than one stool per week.  Even if you have a regular bowel pattern at home, your normal regimen is likely to be disrupted due to multiple reasons following surgery.  Combination of anesthesia, postoperative narcotics, change in appetite and fluid intake all can affect your bowels.   YOU MUST use at least one of the following options; they are listed in order of increasing strength to get the job done.  They are all available over the counter, and you may need to use some, POSSIBLY even all of these options:    Drink plenty of fluids (prune juice may be helpful) and high fiber foods Colace 100 mg by mouth twice a day  Senokot for constipation as directed and as needed Dulcolax (bisacodyl), take with full glass of water  Miralax (polyethylene glycol) once or twice a day as needed.  If you have tried all these things and are unable to have a bowel movement in the first 3-4 days after surgery call either your surgeon or your primary doctor.    If you experience loose stools or diarrhea, hold the medications until you stool forms back up.  If your symptoms do not get better within 1 week or if they get worse, check with your doctor.  If you experience "the worst abdominal pain ever" or develop nausea or vomiting, please contact the office immediately for further recommendations for treatment.   ITCHING:  If you experience itching with your medications, try taking only a single pain pill, or even half a pain pill at a time.  You can also use Benadryl over the counter for itching or also to help with sleep.   TED HOSE STOCKINGS:  Use stockings on both legs until for at least 2 weeks or as directed by physician office. They may be removed at night for sleeping.  MEDICATIONS:  See your medication summary on the "After Visit Summary" that nursing will review with you.  You may have some  home medications which will be placed on hold until you complete the course of blood thinner medication.  It is important for you to complete the blood thinner medication as prescribed.  PRECAUTIONS:  If you experience chest pain or shortness of breath - call 911 immediately for transfer to the hospital emergency department.   If you develop a fever greater that 101 F, purulent drainage from wound, increased redness or drainage from wound, foul odor from the wound/dressing, or calf pain - CONTACT YOUR SURGEON.                                                   FOLLOW-UP APPOINTMENTS:  If you do not already have a post-op appointment, please call the office for an appointment to be seen by your surgeon.  Guidelines for how soon to be seen are listed in your "After Visit Summary", but are typically between 1-4 weeks after surgery.  OTHER INSTRUCTIONS:   Knee Replacement:  Do not place pillow under knee, focus on keeping the knee straight while resting. CPM instructions: 0-90 degrees, 2 hours in the morning, 2 hours in the afternoon,  and 2 hours in the evening. Place foam block, curve side up under heel at all times except when in CPM or when walking.  DO NOT modify, tear, cut, or change the foam block in any way.   MAKE SURE YOU:  Understand these instructions.  Get help right away if you are not doing well or get worse.    Thank you for letting us be a part of your medical care team.  It is a privilege we respect greatly.  We hope these instructions will help you stay on track for a fast and full recovery!   Diagnostic Studies: DG Knee Left Port  Result Date: 06/25/2023 CLINICAL DATA:  Postop knee replacement. EXAM: PORTABLE LEFT KNEE - 1-2 VIEW COMPARISON:  None Available. FINDINGS: Left knee arthroplasty in expected alignment. No periprosthetic lucency or fracture. Recent postsurgical change includes air and edema in the soft tissues and joint space. Anterior skin staples. IMPRESSION: Left knee  arthroplasty without immediate postoperative complication. Electronically Signed   By: Narda Rutherford M.D.   On: 06/25/2023 14:55    Disposition: Discharge disposition: 01-Home or Self Care       Discharge Instructions     Call MD / Call 911   Complete by: As directed    If you experience chest pain or shortness of breath, CALL 911 and be transported to the hospital emergency room.  If you develope a fever above 101 F, pus (white drainage) or increased drainage or redness at the wound, or calf pain, call your surgeon's office.   Constipation Prevention   Complete by: As directed    Drink plenty of fluids.  Prune juice may be helpful.  You may use a stool softener, such as Colace (over the counter) 100 mg twice a day.  Use MiraLax (over the counter) for constipation as needed.   Diet - low sodium heart healthy   Complete by: As directed    Do not put a pillow under the knee. Place it under the heel.   Complete by: As directed    Driving restrictions   Complete by: As directed    No driving for 6 weeks   Increase activity slowly as tolerated   Complete by: As directed    Lifting restrictions   Complete by: As directed    No lifting for 6 weeks   Post-operative opioid taper instructions:   Complete by: As directed    POST-OPERATIVE OPIOID TAPER INSTRUCTIONS: It is important to wean off of your opioid medication as soon as possible. If you do not need pain medication after your surgery it is ok to stop day one. Opioids include: Codeine, Hydrocodone(Norco, Vicodin), Oxycodone(Percocet, oxycontin) and hydromorphone amongst others.  Long term and even short term use of opiods can cause: Increased pain response Dependence Constipation Depression Respiratory depression And more.  Withdrawal symptoms can include Flu like symptoms Nausea, vomiting And more Techniques to manage these symptoms Hydrate well Eat regular healthy meals Stay active Use relaxation techniques(deep  breathing, meditating, yoga) Do Not substitute Alcohol to help with tapering If you have been on opioids for less than two weeks and do not have pain than it is ok to stop all together.  Plan to wean off of opioids This plan should start within one week post op of your joint replacement. Maintain the same interval or time between taking each dose and first decrease the dose.  Cut the total daily intake of opioids by one tablet each day Next start  to increase the time between doses. The last dose that should be eliminated is the evening dose.      TED hose   Complete by: As directed    Use stockings (TED hose) for 2 weeks on both leg(s).  You may remove them at night for sleeping.        Follow-up Information     Clois Dupes, [place]. Schedule an appointment as soon as possible for a visit in 2 week(s).   Specialty: Orthopedic Surgery Why: For wound re-check Contact information: [address] [place] [address] [phone]                  Signed: Iline Oven  06/27/2023, 11:45 AM

## 2023-06-27 NOTE — Progress Notes (Signed)
    Subjective:  Patient reports pain as mild to moderate.  Denies N/V/CP/SOB. Pain much better controlled with PO oxy.  Objective:   VITALS:   Vitals:   06/26/23 2153 06/26/23 2346 06/27/23 0455 06/27/23 0729  BP: (!) 177/109 (!) 172/92 (!) 142/92 (!) 161/90  Pulse: (!) 125 (!) 125 (!) 129 (!) 122  Resp: 17 17 17  (!) 24  Temp: 99.8 F (37.7 C) 98.9 F (37.2 C) 98.3 F (36.8 C) 98.7 F (37.1 C)  TempSrc: Oral Oral Oral Oral  SpO2: 93% 92% 92% 95%  Weight:      Height:        NAD ABD soft Sensation intact distally Intact pulses distally Dorsiflexion/Plantar flexion intact Incision: dressing C/D/I Compartment soft   Lab Results  Component Value Date   WBC 10.7 (H) 06/27/2023   HGB 11.7 (L) 06/27/2023   HCT 35.7 (L) 06/27/2023   MCV 101.1 (H) 06/27/2023   PLT 216 06/27/2023   BMET    Component Value Date/Time   NA 133 (L) 06/27/2023 0334   K 3.1 (L) 06/27/2023 0334   CL 100 06/27/2023 0334   CO2 22 06/27/2023 0334   GLUCOSE 115 (H) 06/27/2023 0334   BUN 11 06/27/2023 0334   CREATININE 0.77 06/27/2023 0334   CALCIUM 8.9 06/27/2023 0334   GFRNONAA >60 06/27/2023 0334     Assessment/Plan: 2 Days Post-Op   Principal Problem:   Osteoarthritis of left knee   WBAT with walker DVT ppx: Aspirin, SCDs, TEDS PO pain control: cont oxy 5-10 mg every 4 hrs PRN & tylenol 1000 mg every 8 hrs PRN PT/OT Hypokalemia: replete oral K+ HTN: will resume home losartan and HCTZ Dispo: d/c home after she clears PT    Jonette Pesa 06/27/2023, 11:43 AM   Samson Frederic, MD [phone] [facility] is now [facility]  [place] [address], [address], [place], [place] [address] Phone: [phone] [email or web address] Facebook  [employer]

## 2023-06-27 NOTE — Progress Notes (Signed)
Physical Therapy Treatment Patient Details Name: Marie Calhoun MRN: [id number] DOB: 1951-09-13 Today's Date: 06/27/2023   History of Present Illness Pt is a 72 yo female presenting to therapy s/p L TKA on 06/25/2023. Pt PMH includes but is not limited to:  Anxiety & depression, Carcot-Marie-Tooth disease, GERD, Lupus, PONV, R-THA 05/30/22 and R-TKA on 07/17/22    PT Comments  POD # 2 pm session Pt was OOB in recliner.  Assisted with amb to bathroom then back to recliner.  Reviewed TKR HEP handout and use of ICE. Pt plans to D/C to her "friends" house as "she does not have any stairs". Pt has met her goals to D/C today with assist from "friend"   If plan is discharge home, recommend the following: A little help with walking and/or transfers;A little help with bathing/dressing/bathroom;Assistance with cooking/housework;Help with stairs or ramp for entrance;Assist for transportation   Can travel by private vehicle        Equipment Recommendations  None recommended by PT    Recommendations for Other Services       Precautions / Restrictions Precautions Precautions: Knee;Fall Precaution Comments: no pillow under knee Restrictions Weight Bearing Restrictions: No Other Position/Activity Restrictions: WBAT     Mobility  Bed Mobility Overal bed mobility: Needs Assistance Bed Mobility: Supine to Sit           General bed mobility comments: pt OOB in recliner    Transfers Overall transfer level: Needs assistance Equipment used: Rolling walker (2 wheels) Transfers: Sit to/from Stand Sit to Stand: Supervision           General transfer comment: pt able to push to stand with B UEs from an elevated bed at Supervision.  Also assisted with a toilet transfer.    Ambulation/Gait Ambulation/Gait assistance: Supervision, Contact guard assist Gait Distance (Feet): 22 Feet Assistive device: Rolling walker (2 wheels) Gait Pattern/deviations: Step-to pattern, Antalgic, Trunk  flexed Gait velocity: decreased     General Gait Details: assisted with amb to bathroom only due to increased fatigue this afternoon.   Stairs             Wheelchair Mobility     Tilt Bed    Modified Rankin (Stroke Patients Only)       Balance                                            Cognition Arousal: Alert Behavior During Therapy: WFL for tasks assessed/performed Overall Cognitive Status: Within Functional Limits for tasks assessed                                 General Comments: AxO x 3 pleasant Lady having had prior Total Joint Repacements.        Exercises      General Comments        Pertinent Vitals/Pain Pain Assessment Pain Assessment: 0-10 Pain Score: 5  Pain Location: L knee Pain Descriptors / Indicators: Aching, Constant, Discomfort, Grimacing, Operative site guarding Pain Intervention(s): Monitored during session, Premedicated before session, Repositioned, Ice applied    Home Living                          Prior Function            PT Goals (  current goals can now be found in the care plan section) Progress towards PT goals: Progressing toward goals    Frequency    7X/week      PT Plan      Co-evaluation              AM-PAC PT "6 Clicks" Mobility   Outcome Measure  Help needed turning from your back to your side while in a flat bed without using bedrails?: A Little Help needed moving from lying on your back to sitting on the side of a flat bed without using bedrails?: A Little Help needed moving to and from a bed to a chair (including a wheelchair)?: A Little Help needed standing up from a chair using your arms (e.g., wheelchair or bedside chair)?: A Little Help needed to walk in hospital room?: A Little Help needed climbing 3-5 steps with a railing? : A Little 6 Click Score: 18    End of Session Equipment Utilized During Treatment: Gait belt Activity Tolerance:  Patient limited by fatigue;Patient limited by pain Patient left: in chair;with call bell/phone within reach;with chair alarm set Nurse Communication: Mobility status PT Visit Diagnosis: Unsteadiness on feet (R26.81);Other abnormalities of gait and mobility (R26.89);Muscle weakness (generalized) (M62.81);Difficulty in walking, not elsewhere classified (R26.2);Pain Pain - Right/Left: Left Pain - part of body: Knee;Leg     Time: 1610-9604 PT Time Calculation (min) (ACUTE ONLY): 26 min  Charges:    $Gait Training: 8-22 mins $Therapeutic Exercise: 8-22 mins PT General Charges $$ ACUTE PT VISIT: 1 Visit                     {   PTA Acute  Rehabilitation Services Office M-F          [phone]

## 2023-06-27 NOTE — Progress Notes (Signed)
Physical Therapy Treatment Patient Details Name: Marie Calhoun MRN: [id number] DOB: [birth year] Today's Date: 06/27/2023   History of Present Illness Pt is a 72 yo female presenting to therapy s/p L TKA on 06/25/2023. Pt PMH includes but is not limited to:  Anxiety & depression, Carcot-Marie-Tooth disease, GERD, Lupus, PONV, R-THA 05/30/22 and R-TKA on 07/17/22    PT Comments  POD # 2 am session Pt feeling "a littler better".  Assisted OOB to amb to bathroom then in hallway.  50% VC's on proper walker to self distance as pt tends to push too far to front.  Also VC's on safety with turns.  Pt fatigues easily and requires freq rest breaks.   Pt plans to D/C to home later today.     If plan is discharge home, recommend the following: A little help with walking and/or transfers;A little help with bathing/dressing/bathroom;Assistance with cooking/housework;Help with stairs or ramp for entrance;Assist for transportation   Can travel by private vehicle        Equipment Recommendations  None recommended by PT    Recommendations for Other Services       Precautions / Restrictions Precautions Precautions: Knee;Fall Precaution Comments: no pillow under knee Restrictions Weight Bearing Restrictions: No Other Position/Activity Restrictions: WBAT     Mobility  Bed Mobility Overal bed mobility: Needs Assistance Bed Mobility: Supine to Sit           General bed mobility comments: demonstarted and instructed how to use a belt to self assist LE    Transfers Overall transfer level: Needs assistance Equipment used: Rolling walker (2 wheels) Transfers: Sit to/from Stand Sit to Stand: Supervision           General transfer comment: pt able to push to stand with B UEs from an elevated bed at Supervision.  Also assisted with a toilet transfer.    Ambulation/Gait Ambulation/Gait assistance: Supervision, Contact guard assist Gait Distance (Feet): 22 Feet Assistive device: Rolling walker  (2 wheels) Gait Pattern/deviations: Step-to pattern, Antalgic, Trunk flexed Gait velocity: decreased     General Gait Details: feeling a little better.  Assisted with amb in room only to and from bathroom.  Then pt was able to amb another 35 feet in hallway.  VC's for proper walker to self distance as she tends to push walker out too far.   Stairs             Wheelchair Mobility     Tilt Bed    Modified Rankin (Stroke Patients Only)       Balance                                            Cognition Arousal: Alert Behavior During Therapy: WFL for tasks assessed/performed Overall Cognitive Status: Within Functional Limits for tasks assessed                                 General Comments: AxO x 3 pleasant Lady having had prior Total Joint Repacements.        Exercises      General Comments        Pertinent Vitals/Pain Pain Assessment Pain Assessment: 0-10 Pain Score: 5  Pain Location: L knee Pain Descriptors / Indicators: Aching, Constant, Discomfort, Grimacing, Operative site guarding Pain Intervention(s): Monitored during session, Premedicated  before session, Repositioned, Ice applied    Home Living                          Prior Function            PT Goals (current goals can now be found in the care plan section) Progress towards PT goals: Progressing toward goals    Frequency    7X/week      PT Plan      Co-evaluation              AM-PAC PT "6 Clicks" Mobility   Outcome Measure  Help needed turning from your back to your side while in a flat bed without using bedrails?: A Little Help needed moving from lying on your back to sitting on the side of a flat bed without using bedrails?: A Little Help needed moving to and from a bed to a chair (including a wheelchair)?: A Little Help needed standing up from a chair using your arms (e.g., wheelchair or bedside chair)?: A Little Help needed  to walk in hospital room?: A Little Help needed climbing 3-5 steps with a railing? : A Little 6 Click Score: 18    End of Session Equipment Utilized During Treatment: Gait belt Activity Tolerance: Patient limited by fatigue;Patient limited by pain Patient left: in chair;with call bell/phone within reach;with chair alarm set Nurse Communication: Mobility status PT Visit Diagnosis: Unsteadiness on feet (R26.81);Other abnormalities of gait and mobility (R26.89);Muscle weakness (generalized) (M62.81);Difficulty in walking, not elsewhere classified (R26.2);Pain Pain - Right/Left: Left Pain - part of body: Knee;Leg     Time: 1110-1136 PT Time Calculation (min) (ACUTE ONLY): 26 min  Charges:    $Gait Training: 8-22 mins $Therapeutic Activity: 8-22 mins PT General Charges $$ ACUTE PT VISIT: 1 Visit                     {   PTA Acute  Rehabilitation Services Office M-F          [phone]

## 2023-07-03 ENCOUNTER — Ambulatory Visit: Payer: Medicare Other | Admitting: Pulmonary Disease

## 2023-08-21 ENCOUNTER — Ambulatory Visit: Payer: Medicare Other | Admitting: Pulmonary Disease

## 2023-09-16 ENCOUNTER — Ambulatory Visit (INDEPENDENT_AMBULATORY_CARE_PROVIDER_SITE_OTHER): Payer: Medicare Other | Admitting: Nurse Practitioner

## 2023-09-16 ENCOUNTER — Encounter: Payer: Self-pay | Admitting: Nurse Practitioner

## 2023-09-16 ENCOUNTER — Ambulatory Visit: Payer: Medicare Other | Admitting: Pulmonary Disease

## 2023-09-16 VITALS — BP 156/95 | HR 82 | Resp 16 | Wt 176.0 lb

## 2023-09-16 DIAGNOSIS — M351 Other overlap syndromes: Secondary | ICD-10-CM | POA: Diagnosis not present

## 2023-09-16 DIAGNOSIS — I011 Acute rheumatic endocarditis: Secondary | ICD-10-CM | POA: Diagnosis not present

## 2023-09-16 DIAGNOSIS — J4520 Mild intermittent asthma, uncomplicated: Secondary | ICD-10-CM

## 2023-09-16 DIAGNOSIS — R0609 Other forms of dyspnea: Secondary | ICD-10-CM | POA: Diagnosis not present

## 2023-09-16 NOTE — Assessment & Plan Note (Signed)
Followed by rheumatology.  Maintained on methotrexate. No evidence of CT ILD on imaging. Will continue with surveillance monitoring.

## 2023-09-16 NOTE — Assessment & Plan Note (Signed)
Mild intermittent Saba use.  Compensated on current regimen.  No recent exacerbations requiring steroids or antibiotics.  No recent hospitalizations.  Action plan in place.  Continue trigger prevention methods.  Patient Instructions  Continue Albuterol inhaler 2 puffs every 6 hours as needed for shortness of breath or wheezing. Notify if symptoms persist despite rescue inhaler/neb use.  Continue flonase nasal spray 2 sprays each nostril daily  Continue methotrexate as prescribed Continue prednisone as prescribed   Repeat CT chest in one year   Flu shot today  Follow up in one year with Dr. Sherene Sires (new pt 30 min slot) in [place] or Dr. Vassie Loll in [place]. If symptoms worsen, please contact office for sooner follow up or seek emergency care.

## 2023-09-16 NOTE — Progress Notes (Signed)
@Patient  ID: Marie Calhoun, female    DOB: 1951-04-29, 72 y.o.   MRN: [id number]  Chief Complaint  Patient presents with   ILD (interstitial lung disease)     Referring provider: Alinda Deem, MD  HPI: 72 year old female, never smoker followed for asthma. She is a patient of Dr. Reginia Naas and last seen in office 11/05/2022. 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 Past medical history significant for HTN, GERD, Charcot-Marie-Tooth disease.   TEST/EVENTS:  10/2022 positive RA factor, positive SSA/SSB, negative CCP 04/30/2023 HRCT chest: Atherosclerosis.  No evidence of ILD.  Mild air trapping.  2.1 cm lesion of the left lobe of the liver, similar to prior and likely cyst.  Status post cholecystectomy.  11/05/2022: OV with Dr. Vassie Loll. Had hip replacement and right knee replacement 6 weeks apart. Recovered well from this. Methotrexate increased by rheumatology to 10 mg every week and prednisone 10 mg/day was added for 3 months. Breathing stable. Intermittent use of albuterol. Previous CT suggested bland scarring rather than true ILD. Plan for repeat CT in 1 year to assess for disease progression.   09/16/2023: Today - follow up Patient presents today for follow up. Doing well since she was here last. She had left knee replacement in early August. She's been doing PT but will be completing this next week.  She just started her methotrexate back about 3 weeks. Her orthopedist wanted her to be off of it for surgery/recovery. She did develop a rash but otherwise, connective tissue symptoms are stable. She's taking prednisone daily right now to help calm the rash down. Breathing feels about the same as her baseline. She notices that her breathing gets worse when she's fatigued. She's able to grocery shop and do stuff around the house without much trouble. No cough, wheezing, chest tightness. Rarely uses her rescue inhaler.    Allergies  Allergen Reactions   Doxycycline Itching and Rash   Demerol [Meperidine] Nausea Only   Pantoprazole Other (See Comments)    Subacute Cutaneous Lupus of the Skin   Plaquenil [Hydroxychloroquine]     Caused another Rash on top of Lupus Rash   Sulfa Antibiotics Swelling    *Childhood*   Sulfamethoxazole-Trimethoprim Other (See Comments)    Childhood    Ranitidine Rash    Immunization History  Administered Date(s) Administered   Fluzone Influenza virus vaccine,trivalent (IIV3), split virus 08/03/2014   H1N1 09/27/2008   Influenza Inj Mdck Quad Pf 08/05/2017   Influenza, Quadrivalent, Recombinant, Inj, Pf 09/01/2018, 08/05/2019   Influenza-Unspecified 08/16/2013, 08/08/2015, 08/10/2019, 08/12/2020, 08/27/2021, 09/24/2022   Moderna Sars-Covid-2 Vaccination 12/15/2019, 01/12/2020, 09/14/2020, 07/27/2021   PNEUMOCOCCAL CONJUGATE-20 08/27/2021   Pneumococcal Conjugate-13 12/28/2014   Pneumococcal Polysaccharide-23 08/27/2021   Td 05/25/2009, 10/02/2021   Tdap 11/18/2008, 05/25/2009, 09/22/2021   Zoster Recombinant(Shingrix) 09/25/2019, 11/19/2019, 02/13/2020    Past Medical History:  Diagnosis Date   Anxiety    Arthritis    Asthma 05/28/2017   Cancer (HCC)    skin cancer   Charcot-Marie-Tooth disease    Depression    Dyspnea    with exertion   Environmental allergies 05/28/2017   Esophageal stricture 05/28/2017   Essential hypertension, benign 05/28/2017   GERD (gastroesophageal reflux disease) 05/28/2017   Hypercholesteremia    Lupus    of skin   PONV (postoperative nausea and vomiting)    Seasonal allergies    Sjogren's syndrome (HCC)     Tobacco History: Social History  Tobacco Use  Smoking Status Never   Passive exposure: Never  Smokeless Tobacco Never   Counseling given: Not Answered   Outpatient Medications Prior to Visit  Medication Sig Dispense Refill   acetaminophen (TYLENOL) 500 MG tablet Take 2 tablets (1,000 mg total) by mouth  every 8 (eight) hours as needed for mild pain, moderate pain or fever. 30 tablet 0   acyclovir (ZOVIRAX) 400 MG tablet Take 400 mg by mouth 3 (three) times daily as needed (for cold sores/fever blisters (takes for 7 days when needed)).     albuterol (VENTOLIN HFA) 108 (90 Base) MCG/ACT inhaler Inhale 2 puffs into the lungs every 6 (six) hours as needed for wheezing or shortness of breath. 18 g 3   amoxicillin (AMOXIL) 500 MG capsule Take 2,000 mg by mouth See admin instructions. Take 1 hour prior to dental work     Calcium Carb-Cholecalciferol (CALCIUM 600 + D PO) Take 1 tablet by mouth 2 (two) times daily.     Cyanocobalamin (B-12) 2500 MCG TABS Take 2,500 mcg by mouth daily.     cycloSPORINE (RESTASIS) 0.05 % ophthalmic emulsion Place 1 drop into both eyes 2 (two) times daily.     fluticasone (FLONASE) 50 MCG/ACT nasal spray USE 1 TO 2 SPRAYS IN EACH NOSTRIL EVERY DAY AS NEEDED FOR ALLERGIES. 16 g 10   folic acid (FOLVITE) 1 MG tablet Take 1 mg by mouth daily.     gabapentin (NEURONTIN) 300 MG capsule Take 300 mg by mouth 3 (three) times daily. PER PT TAKES 900MG  BID     ketoconazole (NIZORAL) 2 % cream Apply 1 Application topically daily as needed (fungus). Apply to nail and finger around affected area     lansoprazole (PREVACID) 30 MG capsule TAKE 1 CAPSULE BY MOUTH ONCE DAILY ON AN EMPTY STOMACH 30-45 MINUTES BEFORE BREAKFAST 90 capsule 3   linaclotide (LINZESS) 145 MCG CAPS capsule Take 1 capsule (145 mcg total) by mouth daily. 90 capsule 3   losartan-hydrochlorothiazide (HYZAAR) 50-12.5 MG tablet Take 0.5 tablets by mouth at bedtime.     methotrexate (RHEUMATREX) 5 MG tablet Take 5 mg by mouth once a week.     mineral oil light external liquid Apply 1 Application topically daily as needed (In ears of needed).     mirabegron ER (MYRBETRIQ) 25 MG TB24 tablet Take 1 tablet (25 mg total) by mouth daily. 90 tablet 3   Multiple Vitamin (MULTIVITAMIN WITH MINERALS) TABS tablet Take 1 tablet by mouth  in the morning. Woman's     ondansetron (ZOFRAN) 4 MG tablet Take 1 tablet (4 mg total) by mouth every 6 (six) hours as needed for nausea. 20 tablet 0   polyethylene glycol (MIRALAX / GLYCOLAX) 17 g packet Take 17 g by mouth daily as needed for mild constipation. 14 each 0   predniSONE (DELTASONE) 5 MG tablet Take 5 mg by mouth daily.     silver sulfADIAZINE (SILVADENE) 1 % cream Apply 1 application topically daily as needed (foot wounds/sores).     triamcinolone cream (KENALOG) 0.1 % Apply 1 Application topically 3 (three) times daily as needed for irritation or itching.     predniSONE (DELTASONE) 10 MG tablet Take 10 mg by mouth daily.     No facility-administered medications prior to visit.     Review of Systems:   Constitutional: No weight loss or gain, night sweats, fevers, chills, or lassitude. +fatigue (baseline) HEENT: No headaches, difficulty swallowing, tooth/dental problems, or sore throat. No sneezing, itching,  ear ache, nasal congestion, or post nasal drip. +dry eyes (baseline) CV:  No chest pain, orthopnea, PND, swelling in lower extremities, anasarca, dizziness, palpitations, syncope Resp: +baseline shortness of breath with exertion. No excess mucus or change in color of mucus. No productive or non-productive. No hemoptysis. No wheezing.  No chest wall deformity GI:  No heartburn, indigestion, abdominal pain, nausea, vomiting, diarrhea, change in bowel habits, loss of appetite, bloody stools.  GU: No dysuria, change in color of urine, urgency or frequency.  No flank pain, no hematuria  Skin: +rash (improving) MSK:  +chronic joint pains Neuro: No dizziness or lightheadedness.  Psych: No depression or anxiety. Mood stable.     Physical Exam:  BP (!) 156/95   Pulse 82   Resp 16   Wt 176 lb (79.8 kg)   SpO2 96%   BMI 26.76 kg/m   GEN: Pleasant, interactive, well-kempt; in no acute distress HEENT:  Normocephalic and atraumatic. PERRLA. Sclera white. Nasal turbinates  pink, moist and patent bilaterally. No rhinorrhea present. Oropharynx pink and moist, without exudate or edema. No lesions, ulcerations, or postnasal drip.  NECK:  Supple w/ fair ROM. No JVD present. Normal carotid impulses w/o bruits. Thyroid symmetrical with no goiter or nodules palpated. No lymphadenopathy.   CV: RRR, no m/r/g, no peripheral edema. Pulses intact, +2 bilaterally. No cyanosis, pallor or clubbing. PULMONARY:  Unlabored, regular breathing. Clear bilaterally A&P w/o wheezes/rales/rhonchi. No accessory muscle use.  GI: BS present and normoactive. Soft, non-tender to palpation. No organomegaly or masses detected.  MSK: No erythema, warmth or tenderness. Cap refil <2 sec all extrem. Bilateral leg braces Neuro: A/Ox3. No focal deficits noted.   Skin: Warm. Scaly rash  Psych: Normal affect and behavior. Judgement and thought content appropriate.     Lab Results:  CBC    Component Value Date/Time   WBC 10.7 (H) 06/27/2023 0334   RBC 3.53 (L) 06/27/2023 0334   HGB 11.7 (L) 06/27/2023 0334   HCT 35.7 (L) 06/27/2023 0334   PLT 216 06/27/2023 0334   MCV 101.1 (H) 06/27/2023 0334   MCH 33.1 06/27/2023 0334   MCHC 32.8 06/27/2023 0334   RDW 12.6 06/27/2023 0334   LYMPHSABS 0.8 07/23/2022 0347   MONOABS 0.7 07/23/2022 0347   EOSABS 0.2 07/23/2022 0347   BASOSABS 0.0 07/23/2022 0347    BMET    Component Value Date/Time   NA 133 (L) 06/27/2023 0334   K 3.1 (L) 06/27/2023 0334   CL 100 06/27/2023 0334   CO2 22 06/27/2023 0334   GLUCOSE 115 (H) 06/27/2023 0334   BUN 11 06/27/2023 0334   CREATININE 0.77 06/27/2023 0334   CALCIUM 8.9 06/27/2023 0334   GFRNONAA >60 06/27/2023 0334    BNP    Component Value Date/Time   BNP 139.5 (H) 07/19/2022 1158     Imaging:  No results found.  Administration History     None           No data to display          No results found for: "NITRICOXIDE"      Assessment & Plan:   Asthma Mild intermittent Saba  use.  Compensated on current regimen.  No recent exacerbations requiring steroids or antibiotics.  No recent hospitalizations.  Action plan in place.  Continue trigger prevention methods.  Patient Instructions  Continue Albuterol inhaler 2 puffs every 6 hours as needed for shortness of breath or wheezing. Notify if symptoms persist despite rescue inhaler/neb use.  Continue flonase nasal spray 2 sprays each nostril daily  Continue methotrexate as prescribed Continue prednisone as prescribed   Repeat CT chest in one year   Flu shot today  Follow up in one year with Dr. Sherene Sires (new pt 30 min slot) in  or Dr. Vassie Loll in [place]. If symptoms worsen, please contact office for sooner follow up or seek emergency care.    Mixed connective tissue disease (HCC) Followed by rheumatology.  Maintained on methotrexate. No evidence of CT ILD on imaging. Will continue with surveillance monitoring.   I spent 28 minutes of dedicated to the care of this patient on the date of this encounter to include pre-visit review of records, face-to-face time with the patient discussing conditions above, post visit ordering of testing, clinical documentation with the electronic health record, making appropriate referrals as documented, and communicating necessary findings to members of the patients care team.  Noemi Chapel, NP 09/16/2023  Pt aware and understands NP's role.

## 2023-09-16 NOTE — Patient Instructions (Signed)
Continue Albuterol inhaler 2 puffs every 6 hours as needed for shortness of breath or wheezing. Notify if symptoms persist despite rescue inhaler/neb use.  Continue flonase nasal spray 2 sprays each nostril daily  Continue methotrexate as prescribed Continue prednisone as prescribed   Repeat CT chest in one year   Flu shot today  Follow up in one year with Dr. Sherene Sires (new pt 30 min slot) in [place] or Dr. Vassie Loll in [place]. If symptoms worsen, please contact office for sooner follow up or seek emergency care.

## 2023-11-09 ENCOUNTER — Other Ambulatory Visit: Payer: Self-pay | Admitting: Pulmonary Disease

## 2024-03-31 ENCOUNTER — Other Ambulatory Visit: Payer: Self-pay | Admitting: Urology

## 2024-03-31 DIAGNOSIS — R350 Frequency of micturition: Secondary | ICD-10-CM

## 2024-03-31 DIAGNOSIS — R3915 Urgency of urination: Secondary | ICD-10-CM

## 2024-03-31 DIAGNOSIS — N3941 Urge incontinence: Secondary | ICD-10-CM

## 2024-03-31 DIAGNOSIS — N3281 Overactive bladder: Secondary | ICD-10-CM

## 2024-04-11 ENCOUNTER — Other Ambulatory Visit (INDEPENDENT_AMBULATORY_CARE_PROVIDER_SITE_OTHER): Payer: Self-pay | Admitting: Gastroenterology

## 2024-04-28 ENCOUNTER — Other Ambulatory Visit (INDEPENDENT_AMBULATORY_CARE_PROVIDER_SITE_OTHER): Payer: Self-pay | Admitting: Gastroenterology

## 2024-05-03 ENCOUNTER — Ambulatory Visit (HOSPITAL_COMMUNITY)
Admission: RE | Admit: 2024-05-03 | Discharge: 2024-05-03 | Disposition: A | Discharge status: Home / Self Care | Source: Ambulatory Visit | Attending: Urology | Admitting: Urology

## 2024-05-03 DIAGNOSIS — N2889 Other specified disorders of kidney and ureter: Secondary | ICD-10-CM | POA: Insufficient documentation

## 2024-05-04 ENCOUNTER — Other Ambulatory Visit (HOSPITAL_COMMUNITY): Payer: Medicare Other

## 2024-05-10 NOTE — Progress Notes (Signed)
 Surgery orders requested via Epic inbox.

## 2024-05-11 ENCOUNTER — Ambulatory Visit: Payer: Medicare Other | Admitting: Urology

## 2024-05-12 NOTE — Progress Notes (Signed)
 Name: Marie Calhoun DOB: [birth year] MRN: [id number]  History of Present Illness: Marie Calhoun is a 73 y.o. female who presents today for follow up visit at [facility] [place].  Relevant History includes: 1. OAB with urinary frequency, nocturia (1-2x/night), urgency, and urge incontinence.  2. Right renal cyst. - 04/30/2023: Renal US  showed stable simple 6.9 cm renal cyst.    At last visit on 06/11/2023: Symptoms improved on Myrbetriq  25 mg daily. PVR = 0 ml.  Since last visit: 05/03/2024: RUS showed interval decreased in size (5.7 cm) of her right renal cyst containing single septation. No stones, masses, or hydronephrosis.  Today: She reports that her OAB symptoms remain well managed with Myrbetriq  25 mg daily, which she would like to continue.  She denies history of recent or recurrent UTI.  She denies flank pain or abdominal pain.   Medications: Current Outpatient Medications  Medication Sig Dispense Refill   acetaminophen  (TYLENOL ) 500 MG tablet Take 2 tablets (1,000 mg total) by mouth every 8 (eight) hours as needed for mild pain, moderate pain or fever. (Patient taking differently: Take 1,000 mg by mouth 3 (three) times daily.) 30 tablet 0   acyclovir  (ZOVIRAX ) 400 MG tablet Take 400 mg by mouth 3 (three) times daily as needed (for cold sores/fever blisters (takes for 7 days when needed)).     albuterol  (VENTOLIN  HFA) 108 (90 Base) MCG/ACT inhaler INHALE 2 PUFFS INTO THE LUNGS EVERY 6 (SIX) HOURS AS NEEDED FOR WHEEZING OR SHORTNESS OF BREATH. 1 each 2   amoxicillin (AMOXIL) 500 MG capsule Take 2,000 mg by mouth See admin instructions. Take 1 hour prior to dental work     busPIRone (BUSPAR) 10 MG tablet Take 10 mg by mouth 2 (two) times daily.     Calcium  Carb-Cholecalciferol  (CALCIUM  600 + D PO) Take 1 tablet by mouth 2 (two) times daily.     Cyanocobalamin  (B-12) 2500 MCG TABS Take 2,500 mcg by mouth daily.     cycloSPORINE  (RESTASIS ) 0.05 % ophthalmic emulsion  Place 1 drop into both eyes 2 (two) times daily.     fluticasone  (FLONASE ) 50 MCG/ACT nasal spray USE 1 TO 2 SPRAYS IN EACH NOSTRIL EVERY DAY AS NEEDED FOR ALLERGIES. 16 g 10   folic acid  (FOLVITE ) 1 MG tablet Take 1 mg by mouth daily.     gabapentin  (NEURONTIN ) 300 MG capsule Take 300-600 mg by mouth See admin instructions. 300  mg twice daily, 600 mg at bedtime     ketoconazole (NIZORAL) 2 % cream Apply 1 Application topically daily as needed (fungus). Apply to nail and finger around affected area     lansoprazole  (PREVACID ) 30 MG capsule TAKE 1 CAPSULE ONCE DAILY ON AN EMPTY STOMACH 30-45 MINUTES BEFORE BREAKFAST 90 capsule 3   LINZESS  145 MCG CAPS capsule TAKE 1 CAPSULE EVERY DAY 90 capsule 3   losartan -hydrochlorothiazide  (HYZAAR) 50-12.5 MG tablet Take 0.5 tablets by mouth at bedtime.     methotrexate (RHEUMATREX) 5 MG tablet Take 5 mg by mouth once a week.     metoprolol succinate (TOPROL-XL) 25 MG 24 hr tablet Take 25 mg by mouth 2 (two) times daily.     mineral oil light external liquid Apply 1 Application topically daily as needed (In ears of needed).     Multiple Vitamin (MULTIVITAMIN WITH MINERALS) TABS tablet Take 1 tablet by mouth in the morning. Woman's     MYRBETRIQ  25 MG TB24 tablet TAKE 1 TABLET EVERY DAY (Patient taking differently: Take  25 mg by mouth at bedtime.) 90 tablet 3   ondansetron  (ZOFRAN ) 4 MG tablet Take 1 tablet (4 mg total) by mouth every 6 (six) hours as needed for nausea. 20 tablet 0   polyethylene glycol (MIRALAX  / GLYCOLAX ) 17 g packet Take 17 g by mouth daily as needed for mild constipation. 14 each 0   predniSONE (DELTASONE) 5 MG tablet Take 5 mg by mouth daily.     silver  sulfADIAZINE  (SILVADENE ) 1 % cream Apply 1 application topically daily as needed (foot wounds/sores).     triamcinolone  cream (KENALOG ) 0.1 % Apply 1 Application topically daily as needed (rash).     No current facility-administered medications for this visit.    Allergies: Allergies   Allergen Reactions   Doxycycline Itching and Rash   Demerol  [Meperidine ] Nausea Only   Pantoprazole  Other (See Comments)    Subacute Cutaneous Lupus of the Skin   Plaquenil [Hydroxychloroquine]     Caused another Rash on top of Lupus Rash   Sulfa Antibiotics Swelling    *Childhood*   Sulfamethoxazole-Trimethoprim Other (See Comments)    Childhood    Ranitidine Rash    Past Medical History:  Diagnosis Date   Anxiety    Arthritis    Asthma 05/28/2017   Cancer (HCC)    skin cancer   Charcot-Marie-Tooth disease    Depression    Dyspnea    with exertion   Environmental allergies 05/28/2017   Esophageal stricture 05/28/2017   Essential hypertension, benign 05/28/2017   GERD (gastroesophageal reflux disease) 05/28/2017   Hypercholesteremia    Lupus    of skin   PONV (postoperative nausea and vomiting)    Seasonal allergies    Sjogren's syndrome (HCC)    Past Surgical History:  Procedure Laterality Date   breast tumor\     left benign   CHOLECYSTECTOMY     COLONOSCOPY WITH PROPOFOL  N/A 05/30/2021   Procedure: COLONOSCOPY WITH PROPOFOL ;  Surgeon: Golda Claudis PENNER, MD;  [facility]: [facility];  Service: Endoscopy;  Laterality: N/A;  855   ESOPHAGEAL DILATION N/A 09/11/2017   Procedure: ESOPHAGEAL DILATION;  Surgeon: Golda Claudis PENNER, MD;  [facility]: [facility];  Service: Endoscopy;  Laterality: N/A;   ESOPHAGEAL DILATION N/A 06/17/2018   Procedure: ESOPHAGEAL DILATION;  Surgeon: Golda Claudis PENNER, MD;  [facility];  Service: Endoscopy;  Laterality: N/A;   ESOPHAGOGASTRODUODENOSCOPY N/A 09/11/2017   Procedure: ESOPHAGOGASTRODUODENOSCOPY (EGD);  Surgeon: Golda Claudis PENNER, MD;  [facility];  Service: Endoscopy;  Laterality: N/A;  1:40 - Can NOT come earlier   ESOPHAGOGASTRODUODENOSCOPY N/A 06/17/2018   Procedure: ESOPHAGOGASTRODUODENOSCOPY (EGD);  Surgeon: Golda Claudis PENNER, MD;  [facility];  Service: Endoscopy;  Laterality: N/A;  10:30    hand tendon release Left    KNEE ARTHROPLASTY Right 07/17/2022   Procedure: COMPUTER ASSISTED TOTAL KNEE ARTHROPLASTY;  Surgeon: Fidel Rogue, MD;  Location: [facility];  Service: Orthopedics;  Laterality: Right;  150   KNEE ARTHROPLASTY Left 06/25/2023   Procedure: COMPUTER ASSISTED TOTAL KNEE ARTHROPLASTY;  Surgeon: Fidel Rogue, MD;  Location: [facility];  Service: Orthopedics;  Laterality: Left;   knee surgeries     1980   LUMBAR EPIDURAL INJECTION     PARTIAL HYSTERECTOMY     1995   POLYPECTOMY  05/30/2021   Procedure: POLYPECTOMY;  Surgeon: Golda Claudis PENNER, MD;  [facility];  Service: Endoscopy;;   TOTAL HIP ARTHROPLASTY Right 05/30/2022   Procedure: TOTAL HIP ARTHROPLASTY  ANTERIOR APPROACH;  Surgeon: Fidel Rogue, MD;  Location: [facility];  Service: Orthopedics;  Laterality: Right;   No family history on file. Social History   Socioeconomic History   Marital status: Single    Spouse name: Not on file   Number of children: Not on file   Years of education: Not on file   Highest education level: Not on file  Occupational History   Not on file  Tobacco Use   Smoking status: Never    Passive exposure: Never   Smokeless tobacco: Never  Vaping Use   Vaping status: Never Used  Substance and Sexual Activity   Alcohol  use: No   Drug use: No   Sexual activity: Not Currently  Other Topics Concern   Not on file  Social History Narrative   Not on file   Social Drivers of Health   Financial Resource Strain: Not on file  Food Insecurity: No Food Insecurity (06/27/2023)   Hunger Vital Sign    Worried About Running Out of Food in the Last Year: Never true    Ran Out of Food in the Last Year: Never true  Transportation Needs: No Transportation Needs (06/27/2023)   PRAPARE - Administrator, Civil Service (Medical): No    Lack of Transportation (Non-Medical): No  Physical Activity: Not on file  Stress: Not on file  Social Connections: Not on file  Intimate  Partner Violence: Not At Risk (06/27/2023)   Humiliation, Afraid, Rape, and Kick questionnaire    Fear of Current or Ex-Partner: No    Emotionally Abused: No    Physically Abused: No    Sexually Abused: No    Review of Systems Constitutional: Patient denies any unintentional weight loss or change in strength lntegumentary: Patient denies any rashes or pruritus Cardiovascular: Patient denies chest pain or syncope Respiratory: Patient denies shortness of breath Gastrointestinal: Patient reports constipation Musculoskeletal: Patient denies muscle cramps or weakness Neurologic: Patient denies convulsions or seizures Allergic/Immunologic: Patient denies recent allergic reaction(s) Hematologic/Lymphatic: Patient denies bleeding tendencies Endocrine: Patient denies heat/cold intolerance  GU: As per HPI.  OBJECTIVE Vitals:   05/14/24 1211  BP: 131/76  Pulse: 80   There is no height or weight on file to calculate BMI.  Physical Examination Constitutional: No obvious distress; patient is non-toxic appearing  Cardiovascular: No visible lower extremity edema.  Respiratory: The patient does not have audible wheezing/stridor; respirations do not appear labored  Gastrointestinal: Abdomen non-distended Musculoskeletal: Normal ROM of UEs  Skin: No obvious rashes/open sores  Neurologic: CN 2-12 grossly intact Psychiatric: Answered questions appropriately with normal affect  Hematologic/Lymphatic/Immunologic: No obvious bruises or sites of spontaneous bleeding  UA: negative  PVR: 0 ml  ASSESSMENT OAB (overactive bladder) - Plan: Urinalysis, Routine w reflex microscopic, BLADDER SCAN AMB NON-IMAGING  Urge incontinence  Renal cyst  We reviewed RUS with no suspicious findings. OK to continue Myrbetriq  for OAB, which is working well for her. Will plan for follow up in 1 year or sooner if needed. Pt verbalized understanding and agreement. All questions were answered.  PLAN Advised the  following: 1. Continue Myrbetriq  (Mirabegron ) 25 mg daily 2. Return in about 1 year (around 05/14/2025) for OAB, with UA & PVR.  Orders Placed This Encounter  Procedures   Urinalysis, Routine w reflex microscopic   BLADDER SCAN AMB NON-IMAGING    It has been explained that the patient is to follow regularly with their PCP in addition to all other providers involved in their care  and to follow instructions provided by these respective offices. Patient advised to contact urology clinic if any urologic-pertaining questions, concerns, new symptoms or problems arise in the interim period.  There are no Patient Instructions on file for this visit.  Electronically signed by:  Lauraine JAYSON Oz, FNP   05/14/24    12:53 PM

## 2024-05-13 NOTE — Progress Notes (Signed)
 Second request for pre op orders in [facility]: [facility]

## 2024-05-14 ENCOUNTER — Ambulatory Visit: Payer: Self-pay | Admitting: Student

## 2024-05-14 ENCOUNTER — Ambulatory Visit (INDEPENDENT_AMBULATORY_CARE_PROVIDER_SITE_OTHER): Admitting: Urology

## 2024-05-14 VITALS — BP 131/76 | HR 80

## 2024-05-14 DIAGNOSIS — N3281 Overactive bladder: Secondary | ICD-10-CM | POA: Diagnosis not present

## 2024-05-14 DIAGNOSIS — N3941 Urge incontinence: Secondary | ICD-10-CM

## 2024-05-14 DIAGNOSIS — N281 Cyst of kidney, acquired: Secondary | ICD-10-CM | POA: Diagnosis not present

## 2024-05-14 LAB — URINALYSIS, ROUTINE W REFLEX MICROSCOPIC
Bilirubin, UA: NEGATIVE
Glucose, UA: NEGATIVE
Ketones, UA: NEGATIVE
Leukocytes,UA: NEGATIVE
Nitrite, UA: NEGATIVE
Protein,UA: NEGATIVE
RBC, UA: NEGATIVE
Specific Gravity, UA: 1.025 (ref 1.005–1.030)
Urobilinogen, Ur: 0.2 mg/dL (ref 0.2–1.0)
pH, UA: 5.5 (ref 5.0–7.5)

## 2024-05-14 NOTE — H&P (View-Only) (Signed)
 TOTAL HIP ADMISSION H&P  Patient is admitted for left total hip arthroplasty.  Subjective:  Chief Complaint: left hip pain  HPI: Marie Calhoun, 73 y.o. female, has a history of pain and functional disability in the left hip(s) due to arthritis and patient has failed non-surgical conservative treatments for greater than 12 weeks to include NSAID's and/or analgesics, corticosteriod injections, flexibility and strengthening excercises, use of assistive devices, and activity modification.  Onset of symptoms was gradual starting >10 years ago with rapidlly worsening course since that time.The patient noted no past surgery on the left hip(s).  Patient currently rates pain in the left hip at 10 out of 10 with activity. Patient has night pain, worsening of pain with activity and weight bearing, trendelenberg gait, pain that interfers with activities of daily living, and pain with passive range of motion. Patient has evidence of subchondral cysts, subchondral sclerosis, periarticular osteophytes, and joint space narrowing by imaging studies. This condition presents safety issues increasing the risk of falls.  There is no current active infection.  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   Hepatic lesion 07/19/2022   Hypoxia 07/19/2022   Hypoglycemia 07/19/2022   Hypokalemia 07/19/2022   Osteoarthritis of right knee 07/17/2022   Osteoarthritis of right hip 05/30/2022   S/P total right hip arthroplasty 05/30/2022   Dyspnea on exertion 07/26/2021   Alkaline phosphatase elevation 10/03/2020   Chronic constipation 07/31/2020   Dupuytren's contracture of left hand 05/10/2020   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   Asthma 05/28/2017   Environmental allergies 05/28/2017   Essential hypertension, benign 05/28/2017   GERD (gastroesophageal reflux disease) 05/28/2017   Allergy to environmental factors 05/28/2017   Past Medical History:  Diagnosis Date   Anxiety    Arthritis    Asthma 05/28/2017   Cancer (HCC)    skin cancer   Charcot-Marie-Tooth disease    Depression    Dyspnea    with exertion   Environmental allergies 05/28/2017   Esophageal stricture 05/28/2017   Essential hypertension, benign 05/28/2017   GERD (gastroesophageal reflux disease) 05/28/2017   Hypercholesteremia    Lupus    of skin   PONV (postoperative nausea and vomiting)    Seasonal allergies    Sjogren's syndrome (HCC)     Past Surgical History:  Procedure Laterality Date   breast tumor\     left benign   CHOLECYSTECTOMY     COLONOSCOPY WITH PROPOFOL  N/A 05/30/2021   Procedure: COLONOSCOPY WITH PROPOFOL ;  Surgeon: Golda Claudis PENNER, MD;  [facility];  Service: Endoscopy;  Laterality: N/A;  855   ESOPHAGEAL DILATION N/A 09/11/2017   Procedure: ESOPHAGEAL DILATION;  Surgeon: Golda Claudis PENNER, MD;  [facility];  Service: Endoscopy;  Laterality: N/A;   ESOPHAGEAL DILATION N/A 06/17/2018   Procedure: ESOPHAGEAL DILATION;  Surgeon: Golda Claudis PENNER, MD;  [facility];  Service: Endoscopy;  Laterality: N/A;   ESOPHAGOGASTRODUODENOSCOPY N/A 09/11/2017   Procedure: ESOPHAGOGASTRODUODENOSCOPY (EGD);  Surgeon: Golda Claudis PENNER, MD;  [facility];  Service: Endoscopy;  Laterality: N/A;  1:40 - Can NOT come earlier   ESOPHAGOGASTRODUODENOSCOPY N/A 06/17/2018   Procedure: ESOPHAGOGASTRODUODENOSCOPY (EGD);  Surgeon: Golda Claudis PENNER, MD;  [facility];  Service: Endoscopy;  Laterality: N/A;  10:30   hand tendon release Left    KNEE ARTHROPLASTY Right 07/17/2022   Procedure: COMPUTER ASSISTED TOTAL KNEE ARTHROPLASTY;  Surgeon: Fidel Rogue, MD;  Location:  [facility];  Service: Orthopedics;  Laterality: Right;  150   KNEE ARTHROPLASTY Left 06/25/2023   Procedure: COMPUTER ASSISTED TOTAL KNEE ARTHROPLASTY;  Surgeon: Fidel Rogue, MD;  Location: [facility];  Service: Orthopedics;  Laterality: Left;   knee surgeries     1980   LUMBAR EPIDURAL INJECTION     PARTIAL HYSTERECTOMY     1995   POLYPECTOMY  05/30/2021   Procedure: POLYPECTOMY;  Surgeon: Golda Claudis PENNER, MD;  [facility];  Service: Endoscopy;;   TOTAL HIP ARTHROPLASTY Right 05/30/2022   Procedure: TOTAL HIP ARTHROPLASTY ANTERIOR APPROACH;  Surgeon: Fidel Rogue, MD;  Location: [facility];  Service: Orthopedics;  Laterality: Right;    Current Outpatient Medications  Medication Sig Dispense Refill Last Dose/Taking   acetaminophen  (TYLENOL ) 500 MG tablet Take 2 tablets (1,000 mg total) by mouth every 8 (eight) hours as needed for mild pain, moderate pain or fever. (Patient taking differently: Take 1,000 mg by mouth 3 (three) times daily.) 30 tablet 0    acyclovir  (ZOVIRAX ) 400 MG tablet Take 400 mg by mouth 3 (three) times daily as needed (for cold sores/fever blisters (takes for 7 days when needed)).      albuterol  (VENTOLIN  HFA) 108 (90 Base) MCG/ACT inhaler INHALE 2 PUFFS INTO THE LUNGS EVERY 6 (SIX) HOURS AS NEEDED FOR WHEEZING OR SHORTNESS OF BREATH. 1 each 2    amoxicillin (AMOXIL) 500 MG capsule Take 2,000 mg by mouth See admin instructions. Take 1 hour prior to dental work      busPIRone  (BUSPAR ) 10 MG tablet Take 10 mg by mouth 2 (two) times daily.      Calcium  Carb-Cholecalciferol  (CALCIUM  600 + D PO) Take 1 tablet by mouth 2 (two) times daily.      Cyanocobalamin  (B-12) 2500 MCG TABS Take 2,500 mcg by mouth daily.      cycloSPORINE  (RESTASIS ) 0.05 % ophthalmic emulsion Place 1 drop into both eyes 2 (two) times daily.      fluticasone  (FLONASE ) 50 MCG/ACT nasal spray USE 1 TO 2 SPRAYS IN EACH NOSTRIL EVERY DAY AS NEEDED FOR ALLERGIES. 16 g 10    folic acid  (FOLVITE ) 1 MG tablet  Take 1 mg by mouth daily.      gabapentin  (NEURONTIN ) 300 MG capsule Take 300-600 mg by mouth See admin instructions. 300  mg twice daily, 600 mg at bedtime      ketoconazole (NIZORAL) 2 % cream Apply 1 Application topically daily as needed (fungus). Apply to nail and finger around affected area      lansoprazole  (PREVACID ) 30 MG capsule TAKE 1 CAPSULE ONCE DAILY ON AN EMPTY STOMACH 30-45 MINUTES BEFORE BREAKFAST 90 capsule 3    LINZESS  145 MCG CAPS capsule TAKE 1 CAPSULE EVERY DAY 90 capsule 3    losartan -hydrochlorothiazide  (HYZAAR) 50-12.5 MG tablet Take 0.5 tablets by mouth at bedtime.      methotrexate (RHEUMATREX) 5 MG tablet Take 5 mg by mouth once a week.      metoprolol  succinate (TOPROL -XL) 25 MG 24 hr tablet Take 25 mg by mouth 2 (two) times daily.      mineral oil light external liquid Apply 1 Application topically daily as needed (In ears of needed).      Multiple Vitamin (MULTIVITAMIN WITH MINERALS)  TABS tablet Take 1 tablet by mouth in the morning. Woman's      MYRBETRIQ  25 MG TB24 tablet TAKE 1 TABLET EVERY DAY (Patient taking differently: Take 25 mg by mouth at bedtime.) 90 tablet 3    ondansetron  (ZOFRAN ) 4 MG tablet Take 1 tablet (4 mg total) by mouth every 6 (six) hours as needed for nausea. 20 tablet 0    polyethylene glycol (MIRALAX  / GLYCOLAX ) 17 g packet Take 17 g by mouth daily as needed for mild constipation. 14 each 0    predniSONE (DELTASONE) 5 MG tablet Take 5 mg by mouth daily.      silver  sulfADIAZINE  (SILVADENE ) 1 % cream Apply 1 application topically daily as needed (foot wounds/sores).      triamcinolone  cream (KENALOG ) 0.1 % Apply 1 Application topically daily as needed (rash).      No current facility-administered medications for this visit.   Allergies  Allergen Reactions   Doxycycline Itching and Rash   Demerol  [Meperidine ] Nausea Only   Pantoprazole  Other (See Comments)    Subacute Cutaneous Lupus of the Skin   Plaquenil [Hydroxychloroquine]     Caused  another Rash on top of Lupus Rash   Sulfa Antibiotics Swelling    *Childhood*   Sulfamethoxazole-Trimethoprim Other (See Comments)    Childhood    Ranitidine Rash    Social History   Tobacco Use   Smoking status: Never    Passive exposure: Never   Smokeless tobacco: Never  Substance Use Topics   Alcohol  use: No    No family history on file.   Review of Systems  Musculoskeletal:  Positive for arthralgias and gait problem.  All other systems reviewed and are negative.   Objective:  Physical Exam Constitutional:      Appearance: Normal appearance.  HENT:     Head: Normocephalic and atraumatic.     Nose: Nose normal.     Mouth/Throat:     Mouth: Mucous membranes are moist.     Pharynx: Oropharynx is clear.  Eyes:     Conjunctiva/sclera: Conjunctivae normal.  Cardiovascular:     Rate and Rhythm: Normal rate and regular rhythm.     Pulses: Normal pulses.     Heart sounds: Normal heart sounds.  Pulmonary:     Effort: Pulmonary effort is normal.     Breath sounds: Normal breath sounds.  Abdominal:     General: Abdomen is flat.     Palpations: Abdomen is soft.  Genitourinary:    Comments: deferred Musculoskeletal:     Cervical back: Normal range of motion and neck supple.     Comments: Examination of the left hip reveals no skin wounds or lesions. Significant trochanteric tenderness to palpation and palpation over the entire IT band. She has restricted range of motion of the hip. Pain with flexion and rotation of the hip. No significant pain in the position of impingement.  Sensory and motor function intact in LE bilaterally. Distal pedal pulses 2+ bilaterally.  No significant pedal edema. Calves soft and non-tender.  She is ambulating with a walker and AFO on LE bilaterally. She has a severely antalgic gait.   Skin:    General: Skin is warm and dry.     Capillary Refill: Capillary refill takes less than 2 seconds.  Neurological:     General: No focal deficit  present.     Mental Status: She is alert and oriented to person, place, and time.  Psychiatric:  Mood and Affect: Mood normal.        Behavior: Behavior normal.        Thought Content: Thought content normal.        Judgment: Judgment normal.     Vital signs in last 24 hours: @VSRANGES @  Labs:   Estimated body mass index is 26.3 kg/m as calculated from the following:   Height as of 05/17/24: 5' 8 (1.727 m).   Weight as of 05/17/24: 78.5 kg.   Imaging Review Plain radiographs demonstrate severe degenerative joint disease of the left hip(s). The bone quality appears to be adequate for age and reported activity level.      Assessment/Plan:  End stage arthritis, left hip(s)  The patient history, physical examination, clinical judgement of the provider and imaging studies are consistent with end stage degenerative joint disease of the left hip(s) and total hip arthroplasty is deemed medically necessary. The treatment options including medical management, injection therapy, arthroscopy and arthroplasty were discussed at length. The risks and benefits of total hip arthroplasty were presented and reviewed. The risks due to aseptic loosening, infection, stiffness, dislocation/subluxation,  thromboembolic complications and other imponderables were discussed.  The patient acknowledged the explanation, agreed to proceed with the plan and consent was signed. Patient is being admitted for inpatient treatment for surgery, pain control, PT, OT, prophylactic antibiotics, VTE prophylaxis, progressive ambulation and ADL's and discharge planning.The patient is planning to be discharged home with HEP after an overnight stay.    Therapy Plans: HEP.  Disposition: Home with [facility]. Planned DVT Prophylaxis: aspirin  81mg  BID  DME needed: None has rolling walker. Cardiology: Cleared  TXA: Yes  Allergies:  - Demerol /meperidine  - vomiting  - Doxycycline - rash  - Ranitidine - rash  - Sulfa  antibiotics - swelling/unknown childhood. - Hydrochloroquine/Plaquenil - rash  Anesthesia Concerns: Nausea with anesthesia.  BMI: 26.6 Last HgbA1c: Not diabetic. 5.1 Other:  - Asthma.  - SLE - methotrexate. Stopped 04/28/24. Hold postoperative until incision healed.  - Charcot Marie Tooth, AFOs bilaterally.  - Right lower back pain.  - Gabapentin  900mg  TID at baseline.  - S/p Rt THA, 05/30/22.  - S/p Rt TKA 07/17/22  - S/p Lt TKA 06/25/23 - Oxycodone , methocarbamol , zofran .  - [facility]** - 04/15/24: cr. 1.10, K+3.7. 03/04/24: Hgb 12.7 - 05/17/24: K+ 3.0, Cr. 0.79, Hgb. 13.0   Patient's anticipated LOS is less than 2 midnights, meeting these requirements: - Younger than 18 - Lives within 1 hour of care - Has a competent adult at home to recover with post-op recover - NO history of  - Chronic pain requiring opiods  - Diabetes  - Coronary Artery Disease  - Heart failure  - Heart attack  - Stroke  - DVT/VTE  - Cardiac arrhythmia  - Respiratory Failure/COPD  - Renal failure  - Anemia  - Advanced Liver disease

## 2024-05-14 NOTE — H&P (Signed)
 TOTAL HIP ADMISSION H&P  Patient is admitted for left total hip arthroplasty.  Subjective:  Chief Complaint: left hip pain  HPI: Marie Calhoun, 73 y.o. female, has a history of pain and functional disability in the left hip(s) due to arthritis and patient has failed non-surgical conservative treatments for greater than 12 weeks to include NSAID's and/or analgesics, corticosteriod injections, flexibility and strengthening excercises, use of assistive devices, and activity modification.  Onset of symptoms was gradual starting >10 years ago with rapidlly worsening course since that time.The patient noted no past surgery on the left hip(s).  Patient currently rates pain in the left hip at 10 out of 10 with activity. Patient has night pain, worsening of pain with activity and weight bearing, trendelenberg gait, pain that interfers with activities of daily living, and pain with passive range of motion. Patient has evidence of subchondral cysts, subchondral sclerosis, periarticular osteophytes, and joint space narrowing by imaging studies. This condition presents safety issues increasing the risk of falls.  There is no current active infection.  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   Hepatic lesion 07/19/2022   Hypoxia 07/19/2022   Hypoglycemia 07/19/2022   Hypokalemia 07/19/2022   Osteoarthritis of right knee 07/17/2022   Osteoarthritis of right hip 05/30/2022   S/P total right hip arthroplasty 05/30/2022   Dyspnea on exertion 07/26/2021   Alkaline phosphatase elevation 10/03/2020   Chronic constipation 07/31/2020   Dupuytren's contracture of left hand 05/10/2020   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   Asthma 05/28/2017   Environmental allergies 05/28/2017   Essential hypertension, benign 05/28/2017   GERD (gastroesophageal reflux disease) 05/28/2017   Allergy to environmental factors 05/28/2017   Past Medical History:  Diagnosis Date   Anxiety    Arthritis    Asthma 05/28/2017   Cancer (HCC)    skin cancer   Charcot-Marie-Tooth disease    Depression    Dyspnea    with exertion   Environmental allergies 05/28/2017   Esophageal stricture 05/28/2017   Essential hypertension, benign 05/28/2017   GERD (gastroesophageal reflux disease) 05/28/2017   Hypercholesteremia    Lupus    of skin   PONV (postoperative nausea and vomiting)    Seasonal allergies    Sjogren's syndrome (HCC)     Past Surgical History:  Procedure Laterality Date   breast tumor\     left benign   CHOLECYSTECTOMY     COLONOSCOPY WITH PROPOFOL  N/A 05/30/2021   Procedure: COLONOSCOPY WITH PROPOFOL ;  Surgeon: Golda Claudis PENNER, MD;  Location: [facility];  Service: Endoscopy;  Laterality: N/A;  855   ESOPHAGEAL DILATION N/A 09/11/2017   Procedure: ESOPHAGEAL DILATION;  Surgeon: Golda Claudis PENNER, MD;  Location: [facility];  Service: Endoscopy;  Laterality: N/A;   ESOPHAGEAL DILATION N/A 06/17/2018   Procedure: ESOPHAGEAL DILATION;  Surgeon: Golda Claudis PENNER, MD;  Location: [facility];  Service: Endoscopy;  Laterality: N/A;   ESOPHAGOGASTRODUODENOSCOPY N/A 09/11/2017   Procedure: ESOPHAGOGASTRODUODENOSCOPY (EGD);  Surgeon: Golda Claudis PENNER, MD;  Location: [facility];  Service: Endoscopy;  Laterality: N/A;  1:40 - Can NOT come earlier   ESOPHAGOGASTRODUODENOSCOPY N/A 06/17/2018   Procedure: ESOPHAGOGASTRODUODENOSCOPY (EGD);  Surgeon: Golda Claudis PENNER, MD;  Location: [facility];  Service: Endoscopy;  Laterality: N/A;  10:30   hand tendon release Left    KNEE ARTHROPLASTY Right 07/17/2022   Procedure: COMPUTER ASSISTED TOTAL KNEE ARTHROPLASTY;  Surgeon: Fidel Rogue, MD;  Location:  [facility];  Service: Orthopedics;  Laterality: Right;  150   KNEE ARTHROPLASTY Left 06/25/2023   Procedure: COMPUTER ASSISTED TOTAL KNEE ARTHROPLASTY;  Surgeon: Fidel Rogue, MD;  Location: [facility];  Service: Orthopedics;  Laterality: Left;   knee surgeries     1980   LUMBAR EPIDURAL INJECTION     PARTIAL HYSTERECTOMY     1995   POLYPECTOMY  05/30/2021   Procedure: POLYPECTOMY;  Surgeon: Golda Claudis PENNER, MD;  Location: [facility];  Service: Endoscopy;;   TOTAL HIP ARTHROPLASTY Right 05/30/2022   Procedure: TOTAL HIP ARTHROPLASTY ANTERIOR APPROACH;  Surgeon: Fidel Rogue, MD;  Location: [facility];  Service: Orthopedics;  Laterality: Right;    Current Outpatient Medications  Medication Sig Dispense Refill Last Dose/Taking   acetaminophen  (TYLENOL ) 500 MG tablet Take 2 tablets (1,000 mg total) by mouth every 8 (eight) hours as needed for mild pain, moderate pain or fever. (Patient taking differently: Take 1,000 mg by mouth 3 (three) times daily.) 30 tablet 0    acyclovir  (ZOVIRAX ) 400 MG tablet Take 400 mg by mouth 3 (three) times daily as needed (for cold sores/fever blisters (takes for 7 days when needed)).      albuterol  (VENTOLIN  HFA) 108 (90 Base) MCG/ACT inhaler INHALE 2 PUFFS INTO THE LUNGS EVERY 6 (SIX) HOURS AS NEEDED FOR WHEEZING OR SHORTNESS OF BREATH. 1 each 2    amoxicillin (AMOXIL) 500 MG capsule Take 2,000 mg by mouth See admin instructions. Take 1 hour prior to dental work      busPIRone (BUSPAR) 10 MG tablet Take 10 mg by mouth 2 (two) times daily.      Calcium  Carb-Cholecalciferol  (CALCIUM  600 + D PO) Take 1 tablet by mouth 2 (two) times daily.      Cyanocobalamin  (B-12) 2500 MCG TABS Take 2,500 mcg by mouth daily.      cycloSPORINE  (RESTASIS ) 0.05 % ophthalmic emulsion Place 1 drop into both eyes 2 (two) times daily.      fluticasone  (FLONASE ) 50 MCG/ACT nasal spray USE 1 TO 2 SPRAYS IN EACH NOSTRIL EVERY DAY AS NEEDED FOR ALLERGIES. 16 g 10    folic acid  (FOLVITE ) 1 MG tablet  Take 1 mg by mouth daily.      gabapentin  (NEURONTIN ) 300 MG capsule Take 300-600 mg by mouth See admin instructions. 300  mg twice daily, 600 mg at bedtime      ketoconazole (NIZORAL) 2 % cream Apply 1 Application topically daily as needed (fungus). Apply to nail and finger around affected area      lansoprazole  (PREVACID ) 30 MG capsule TAKE 1 CAPSULE ONCE DAILY ON AN EMPTY STOMACH 30-45 MINUTES BEFORE BREAKFAST 90 capsule 3    LINZESS  145 MCG CAPS capsule TAKE 1 CAPSULE EVERY DAY 90 capsule 3    losartan -hydrochlorothiazide  (HYZAAR) 50-12.5 MG tablet Take 0.5 tablets by mouth at bedtime.      methotrexate (RHEUMATREX) 5 MG tablet Take 5 mg by mouth once a week.      metoprolol succinate (TOPROL-XL) 25 MG 24 hr tablet Take 25 mg by mouth 2 (two) times daily.      mineral oil light external liquid Apply 1 Application topically daily as needed (In ears of needed).      Multiple Vitamin (MULTIVITAMIN WITH MINERALS)  TABS tablet Take 1 tablet by mouth in the morning. Woman's      MYRBETRIQ  25 MG TB24 tablet TAKE 1 TABLET EVERY DAY (Patient taking differently: Take 25 mg by mouth at bedtime.) 90 tablet 3    ondansetron  (ZOFRAN ) 4 MG tablet Take 1 tablet (4 mg total) by mouth every 6 (six) hours as needed for nausea. 20 tablet 0    polyethylene glycol (MIRALAX  / GLYCOLAX ) 17 g packet Take 17 g by mouth daily as needed for mild constipation. 14 each 0    predniSONE (DELTASONE) 5 MG tablet Take 5 mg by mouth daily.      silver  sulfADIAZINE  (SILVADENE ) 1 % cream Apply 1 application topically daily as needed (foot wounds/sores).      triamcinolone  cream (KENALOG ) 0.1 % Apply 1 Application topically daily as needed (rash).      No current facility-administered medications for this visit.   Allergies  Allergen Reactions   Doxycycline Itching and Rash   Demerol  [Meperidine ] Nausea Only   Pantoprazole  Other (See Comments)    Subacute Cutaneous Lupus of the Skin   Plaquenil [Hydroxychloroquine]     Caused  another Rash on top of Lupus Rash   Sulfa Antibiotics Swelling    *Childhood*   Sulfamethoxazole-Trimethoprim Other (See Comments)    Childhood    Ranitidine Rash    Social History   Tobacco Use   Smoking status: Never    Passive exposure: Never   Smokeless tobacco: Never  Substance Use Topics   Alcohol  use: No    No family history on file.   Review of Systems  Musculoskeletal:  Positive for arthralgias and gait problem.  All other systems reviewed and are negative.   Objective:  Physical Exam Constitutional:      Appearance: Normal appearance.  HENT:     Head: Normocephalic and atraumatic.     Nose: Nose normal.     Mouth/Throat:     Mouth: Mucous membranes are moist.     Pharynx: Oropharynx is clear.  Eyes:     Conjunctiva/sclera: Conjunctivae normal.  Cardiovascular:     Rate and Rhythm: Normal rate and regular rhythm.     Pulses: Normal pulses.     Heart sounds: Normal heart sounds.  Pulmonary:     Effort: Pulmonary effort is normal.     Breath sounds: Normal breath sounds.  Abdominal:     General: Abdomen is flat.     Palpations: Abdomen is soft.  Genitourinary:    Comments: deferred Musculoskeletal:     Cervical back: Normal range of motion and neck supple.     Comments: Examination of the left hip reveals no skin wounds or lesions. Significant trochanteric tenderness to palpation and palpation over the entire IT band. She has restricted range of motion of the hip. Pain with flexion and rotation of the hip. No significant pain in the position of impingement.  Sensory and motor function intact in LE bilaterally. Distal pedal pulses 2+ bilaterally.  No significant pedal edema. Calves soft and non-tender.  She is ambulating with a walker and AFO on LE bilaterally. She has a severely antalgic gait.   Skin:    General: Skin is warm and dry.     Capillary Refill: Capillary refill takes less than 2 seconds.  Neurological:     General: No focal deficit  present.     Mental Status: She is alert and oriented to person, place, and time.  Psychiatric:  Mood and Affect: Mood normal.        Behavior: Behavior normal.        Thought Content: Thought content normal.        Judgment: Judgment normal.     Vital signs in last 24 hours: @VSRANGES @  Labs:   Estimated body mass index is 26.3 kg/m as calculated from the following:   Height as of 05/17/24: 5' 8 (1.727 m).   Weight as of 05/17/24: 78.5 kg.   Imaging Review Plain radiographs demonstrate severe degenerative joint disease of the left hip(s). The bone quality appears to be adequate for age and reported activity level.      Assessment/Plan:  End stage arthritis, left hip(s)  The patient history, physical examination, clinical judgement of the provider and imaging studies are consistent with end stage degenerative joint disease of the left hip(s) and total hip arthroplasty is deemed medically necessary. The treatment options including medical management, injection therapy, arthroscopy and arthroplasty were discussed at length. The risks and benefits of total hip arthroplasty were presented and reviewed. The risks due to aseptic loosening, infection, stiffness, dislocation/subluxation,  thromboembolic complications and other imponderables were discussed.  The patient acknowledged the explanation, agreed to proceed with the plan and consent was signed. Patient is being admitted for inpatient treatment for surgery, pain control, PT, OT, prophylactic antibiotics, VTE prophylaxis, progressive ambulation and ADL's and discharge planning.The patient is planning to be discharged home with HEP after an overnight stay.    Therapy Plans: HEP.  Disposition: Home with [facility]. Planned DVT Prophylaxis: aspirin  81mg  BID  DME needed: None has rolling walker. Cardiology: Cleared  TXA: Yes  Allergies:  - Demerol /meperidine  - vomiting  - Doxycycline - rash  - Ranitidine - rash  - Sulfa  antibiotics - swelling/unknown childhood. - Hydrochloroquine/Plaquenil - rash  Anesthesia Concerns: Nausea with anesthesia.  BMI: 26.6 Last HgbA1c: Not diabetic. 5.1 Other:  - Asthma.  - SLE - methotrexate. Stopped 04/28/24. Hold postoperative until incision healed.  - Charcot Marie Tooth, AFOs bilaterally.  - Right lower back pain.  - Gabapentin  900mg  TID at baseline.  - S/p Rt THA, 05/30/22.  - S/p Rt TKA 07/17/22  - S/p Lt TKA 06/25/23 - Oxycodone , methocarbamol , zofran .  - Staples** - 04/15/24: cr. 1.10, K+3.7. 03/04/24: Hgb 12.7 - 05/17/24: K+ 3.0, Cr. 0.79, Hgb. 13.0   Patient's anticipated LOS is less than 2 midnights, meeting these requirements: - Younger than 64 - Lives within 1 hour of care - Has a competent adult at home to recover with post-op recover - NO history of  - Chronic pain requiring opiods  - Diabetes  - Coronary Artery Disease  - Heart failure  - Heart attack  - Stroke  - DVT/VTE  - Cardiac arrhythmia  - Respiratory Failure/COPD  - Renal failure  - Anemia  - Advanced Liver disease

## 2024-05-14 NOTE — Progress Notes (Signed)
 Bladder Scan completed today.  Patient can void prior to the bladder scan. Bladder scan result: 0  Performed By: Gwendolyn Grant T. CMA  Additional notes-

## 2024-05-15 NOTE — Patient Instructions (Signed)
 SURGICAL WAITING ROOM VISITATION Patients having surgery or a procedure may have no more than 2 support people in the waiting area - these visitors may rotate in the visitor waiting room.   If the patient needs to stay at the hospital during part of their recovery, the visitor guidelines for inpatient rooms apply.  PRE-OP VISITATION  Pre-op nurse will coordinate an appropriate time for 1 support person to accompany the patient in pre-op.  This support person may not rotate.  This visitor will be contacted when the time is appropriate for the visitor to come back in the pre-op area.  Please refer to the [facility] website for the visitor guidelines for Inpatients (after your surgery is over and you are in a regular room).  You are not required to quarantine at this time prior to your surgery. However, you must do this: Hand Hygiene often Do NOT share personal items Notify your provider if you are in close contact with someone who has COVID or you develop fever 100.4 or greater, new onset of sneezing, cough, sore throat, shortness of breath or body aches.  If you test positive for Covid or have been in contact with anyone that has tested positive in the last 10 days please notify you surgeon.    Your procedure is scheduled on:  THURSDAY  May 27, 2024  Report to [facility] Main Entrance: [address] entrance where the [employer] is available.   Report to admitting at: 07:30  AM  Call this number if you have any questions or problems the morning of surgery [phone]  Do not eat food after Midnight the night prior to your surgery/procedure.  After Midnight you may have the following liquids until   07:00 AM DAY OF SURGERY  Clear Liquid Diet Water  Black Coffee (sugar ok, NO MILK/CREAM OR CREAMERS)  Tea (sugar ok, NO MILK/CREAM OR CREAMERS) regular and decaf                             Plain Jell-O  with no fruit (NO RED)                                           Fruit ices  (not with fruit pulp, NO RED)                                     Popsicles (NO RED)                                                                  Juice: NO CITRUS JUICES: only apple, WHITE grape, WHITE cranberry Sports drinks like Gatorade or Powerade (NO RED)                   The day of surgery:  Drink ONE (1) Pre-Surgery Clear Ensure at   07:00 AM the morning of surgery. Drink in one sitting. Do not sip.  This drink was given to you during your hospital pre-op appointment visit. Nothing else to drink after completing  the Pre-Surgery Clear Ensure : No candy, chewing gum or throat lozenges.    FOLLOW ANY ADDITIONAL PRE OP INSTRUCTIONS YOU RECEIVED FROM YOUR SURGEON'S OFFICE!!!   Oral Hygiene is also important to reduce your risk of infection.        Remember - BRUSH YOUR TEETH THE MORNING OF SURGERY WITH YOUR REGULAR TOOTHPASTE  Do NOT smoke after Midnight the night before surgery.  STOP TAKING all Vitamins, Herbs and supplements 1 week before your surgery.   Take ONLY these medicines the morning of surgery with A SIP OF WATER : Lansoprazole , metoprolol, buspirone, gabapentin , and Tylenol  if needed. You may use your Eye Drops, Flonase  nasal spray and Albuterol  if needed. Please bring your Albuterol  inhaler with you on the day of surgery.  DO NOT TAKE Losartan  / HCTZ the morning of surgery.   You may not have any metal on your body including hair pins, jewelry, and body piercing  Do not wear make-up, lotions, powders, perfumes or deodorant  Do not wear nail polish including gel and S&S, artificial / acrylic nails, or any other type of covering on natural nails including finger and toenails. If you have artificial nails, gel coating, etc., that needs to be removed by a nail salon, Please have this removed prior to surgery. Not doing so may mean that your surgery could be cancelled or delayed if the Surgeon or anesthesia staff feels like they are unable to monitor you safely.   Do  not shave 48 hours prior to surgery to avoid nicks in your skin which may contribute to postoperative infections.   Contacts, Hearing Aids, dentures or bridgework may not be worn into surgery. DENTURES WILL BE REMOVED PRIOR TO SURGERY PLEASE DO NOT APPLY Poly grip OR ADHESIVES!!!  You may bring a small overnight bag with you on the day of surgery, only pack items that are not valuable. [facility] IS NOT RESPONSIBLE   FOR VALUABLES THAT ARE LOST OR STOLEN.   Do not bring your home medications to the hospital. The Pharmacy will dispense medications listed on your medication list to you during your admission in the Hospital.  Special Instructions: Bring a copy of your healthcare power of attorney and living will documents the day of surgery, if you wish to have them scanned into your [facility] Records- EPIC  Please read over the following fact sheets you were given: IF YOU HAVE QUESTIONS ABOUT YOUR PRE-OP INSTRUCTIONS, PLEASE CALL [phone].     Pre-operative 5 CHG Bath Instructions   You can play a key role in reducing the risk of infection after surgery. Your skin needs to be as free of germs as possible. You can reduce the number of germs on your skin by washing with CHG (chlorhexidine  gluconate) soap before surgery. CHG is an antiseptic soap that kills germs and continues to kill germs even after washing.   DO NOT use if you have an allergy to chlorhexidine /CHG or antibacterial soaps. If your skin becomes reddened or irritated, stop using the CHG and notify one of our RNs at [phone]  Please shower with the CHG soap starting 4 days before surgery using the following schedule: START SHOWERS ON   SUNDAY  May 23, 2024  Please keep in mind the following:  DO NOT shave, including legs and underarms,  starting the day of your first shower.   You may shave your face at any point before/day of surgery.   Place clean sheets on your bed the day you start using CHG soap. Use a clean washcloth (not used since being washed) for each shower. DO NOT sleep with pets once you start using the CHG.   CHG Shower Instructions:  If you choose to wash your hair and private area, wash first with your normal shampoo/soap.  After you use shampoo/soap, rinse your hair and body thoroughly to remove shampoo/soap residue.  Turn the water  OFF and apply about 3 tablespoons (45 ml) of CHG soap to a CLEAN washcloth.  Apply CHG soap ONLY FROM YOUR NECK DOWN TO YOUR TOES (washing for 3-5 minutes)  DO NOT use CHG soap on face, private areas, open wounds, or sores.  Pay special attention to the area where your surgery is being performed.  If you are having back surgery, having someone wash your back for you may be helpful.  Wait 2 minutes after CHG soap is applied, then you may rinse off the CHG soap.  Pat dry with a clean towel  Put on clean clothes/pajamas   If you choose to wear lotion, please use ONLY the CHG-compatible lotions on the back of this paper.     Additional instructions for the day of surgery: DO NOT APPLY any lotions, deodorants, cologne, or perfumes.   Put on clean/comfortable clothes.  Brush your teeth.  Ask your nurse before applying any prescription medications to the skin.      CHG Compatible Lotions   Aveeno Moisturizing lotion  Cetaphil Moisturizing Cream  Cetaphil Moisturizing Lotion  Clairol Herbal Essence Moisturizing Lotion, Dry Skin  Clairol Herbal Essence Moisturizing Lotion, Extra Dry Skin  Clairol Herbal Essence Moisturizing Lotion, Normal Skin  Curel Age Defying Therapeutic Moisturizing Lotion with Alpha Hydroxy  Curel Extreme Care Body Lotion  Curel Soothing Hands Moisturizing Hand Lotion  Curel Therapeutic Moisturizing Cream, Fragrance-Free  Curel Therapeutic  Moisturizing Lotion, Fragrance-Free  Curel Therapeutic Moisturizing Lotion, Original Formula  Eucerin Daily Replenishing Lotion  Eucerin Dry Skin Therapy Plus Alpha Hydroxy Crme  Eucerin Dry Skin Therapy Plus Alpha Hydroxy Lotion  Eucerin Original Crme  Eucerin Original Lotion  Eucerin Plus Crme Eucerin Plus Lotion  Eucerin TriLipid Replenishing Lotion  Keri Anti-Bacterial Hand Lotion  Keri Deep Conditioning Original Lotion Dry Skin Formula Softly Scented  Keri Deep Conditioning Original Lotion, Fragrance Free Sensitive Skin Formula  Keri Lotion Fast Absorbing Fragrance Free Sensitive Skin Formula  Keri Lotion Fast Absorbing Softly Scented Dry Skin Formula  Keri Original Lotion  Keri Skin Renewal Lotion Keri Silky Smooth Lotion  Keri Silky Smooth Sensitive Skin Lotion  Nivea Body Creamy Conditioning Oil  Nivea Body Extra Enriched Lotion  Nivea Body Original Lotion  Nivea Body Sheer Moisturizing Lotion Nivea Crme  Nivea Skin Firming Lotion  NutraDerm 30 Skin Lotion  NutraDerm Skin Lotion  NutraDerm Therapeutic Skin Cream  NutraDerm Therapeutic Skin Lotion  ProShield Protective Hand Cream  Provon moisturizing lotion   FAILURE TO FOLLOW THESE INSTRUCTIONS MAY RESULT IN THE CANCELLATION OF YOUR SURGERY  PATIENT SIGNATURE_________________________________  NURSE SIGNATURE__________________________________  ________________________________________________________________________       Nasario Exon    An incentive spirometer is a tool that can help keep your lungs clear and active. This tool measures how well you are filling your lungs with each breath. Taking  long deep breaths may help reverse or decrease the chance of developing breathing (pulmonary) problems (especially infection) following: A long period of time when you are unable to move or be active. BEFORE THE PROCEDURE  If the spirometer includes an indicator to show your best effort, your nurse or  respiratory therapist will set it to a desired goal. If possible, sit up straight or lean slightly forward. Try not to slouch. Hold the incentive spirometer in an upright position. INSTRUCTIONS FOR USE  Sit on the edge of your bed if possible, or sit up as far as you can in bed or on a chair. Hold the incentive spirometer in an upright position. Breathe out normally. Place the mouthpiece in your mouth and seal your lips tightly around it. Breathe in slowly and as deeply as possible, raising the piston or the ball toward the top of the column. Hold your breath for 3-5 seconds or for as long as possible. Allow the piston or ball to fall to the bottom of the column. Remove the mouthpiece from your mouth and breathe out normally. Rest for a few seconds and repeat Steps 1 through 7 at least 10 times every 1-2 hours when you are awake. Take your time and take a few normal breaths between deep breaths. The spirometer may include an indicator to show your best effort. Use the indicator as a goal to work toward during each repetition. After each set of 10 deep breaths, practice coughing to be sure your lungs are clear. If you have an incision (the cut made at the time of surgery), support your incision when coughing by placing a pillow or rolled up towels firmly against it. Once you are able to get out of bed, walk around indoors and cough well. You may stop using the incentive spirometer when instructed by your caregiver.  RISKS AND COMPLICATIONS Take your time so you do not get dizzy or light-headed. If you are in pain, you may need to take or ask for pain medication before doing incentive spirometry. It is harder to take a deep breath if you are having pain. AFTER USE Rest and breathe slowly and easily. It can be helpful to keep track of a log of your progress. Your caregiver can provide you with a simple table to help with this. If you are using the spirometer at home, follow these  instructions: SEEK MEDICAL CARE IF:  You are having difficultly using the spirometer. You have trouble using the spirometer as often as instructed. Your pain medication is not giving enough relief while using the spirometer. You develop fever of 100.5 F (38.1 C) or higher.                                                                                                    SEEK IMMEDIATE MEDICAL CARE IF:  You cough up bloody sputum that had not been present before. You develop fever of 102 F (38.9 C) or greater. You develop worsening pain at or near the incision site. MAKE SURE YOU:  Understand these instructions. Will watch your condition.  Will get help right away if you are not doing well or get worse. Document Released: 03/17/2007 Document Revised: 01/27/2012 Document Reviewed: 05/18/2007 [facility] Patient Information 2014 [place], [place].       WHAT IS A BLOOD TRANSFUSION? Blood Transfusion Information  A transfusion is the replacement of blood or some of its parts. Blood is made up of multiple cells which provide different functions. Red blood cells carry oxygen and are used for blood loss replacement. White blood cells fight against infection. Platelets control bleeding. Plasma helps clot blood. Other blood products are available for specialized needs, such as hemophilia or other clotting disorders. BEFORE THE TRANSFUSION  Who gives blood for transfusions?  Healthy volunteers who are fully evaluated to make sure their blood is safe. This is blood bank blood. Transfusion therapy is the safest it has ever been in the practice of medicine. Before blood is taken from a donor, a complete history is taken to make sure that person has no history of diseases nor engages in risky social behavior (examples are intravenous drug use or sexual activity with multiple partners). The donor's travel history is screened to minimize risk of transmitting infections, such as malaria. The donated  blood is tested for signs of infectious diseases, such as HIV and hepatitis. The blood is then tested to be sure it is compatible with you in order to minimize the chance of a transfusion reaction. If you or a relative donates blood, this is often done in anticipation of surgery and is not appropriate for emergency situations. It takes many days to process the donated blood. RISKS AND COMPLICATIONS Although transfusion therapy is very safe and saves many lives, the main dangers of transfusion include:  Getting an infectious disease. Developing a transfusion reaction. This is an allergic reaction to something in the blood you were given. Every precaution is taken to prevent this. The decision to have a blood transfusion has been considered carefully by your caregiver before blood is given. Blood is not given unless the benefits outweigh the risks. AFTER THE TRANSFUSION Right after receiving a blood transfusion, you will usually feel much better and more energetic. This is especially true if your red blood cells have gotten low (anemic). The transfusion raises the level of the red blood cells which carry oxygen, and this usually causes an energy increase. The nurse administering the transfusion will monitor you carefully for complications. HOME CARE INSTRUCTIONS  No special instructions are needed after a transfusion. You may find your energy is better. Speak with your caregiver about any limitations on activity for underlying diseases you may have. SEEK MEDICAL CARE IF:  Your condition is not improving after your transfusion. You develop redness or irritation at the intravenous (IV) site. SEEK IMMEDIATE MEDICAL CARE IF:  Any of the following symptoms occur over the next 12 hours: Shaking chills. You have a temperature by mouth above 102 F (38.9 C), not controlled by medicine. Chest, back, or muscle pain. People around you feel you are not acting correctly or are confused. Shortness of breath or  difficulty breathing. Dizziness and fainting. You get a rash or develop hives. You have a decrease in urine output. Your urine turns a dark color or changes to pink, red, or brown. Any of the following symptoms occur over the next 10 days: You have a temperature by mouth above 102 F (38.9 C), not controlled by medicine. Shortness of breath. Weakness after normal activity. The white part of the eye turns yellow (jaundice). You have  a decrease in the amount of urine or are urinating less often. Your urine turns a dark color or changes to pink, red, or brown. Document Released: 11/01/2000 Document Revised: 01/27/2012 Document Reviewed: 06/20/2008 [facility] Patient Information 2014 [place], [place].  _______________________________________________________________________          If you would like to see a video about joint replacement:   [email or web address]

## 2024-05-15 NOTE — Progress Notes (Signed)
 COVID Vaccine received:  []  No [x]  Yes Date of any COVID positive Test in last 54 days:none  PCP - Garnette Bo, MD [phone] (Work)  Cardiologist - Mearl Gene, MD at [facility] [phone] (Work)  Pulmonology- Harden Staff, MD  Chest x-ray - 07-18-2022  1v  Epic EKG -07-19-2022  will repeat   Stress Test -  ECHO -  Cardiac Cath -  CT Coronary Calcium  score:   Pacemaker / ICD device [x]  No []  Yes   Spinal Cord Stimulator:[x]  No []  Yes       History of Sleep Apnea? [x]  No []  Yes   CPAP used?- [x]  No []  Yes    Does the patient monitor blood sugar?   [x]  N/A   []  No []  Yes  Patient has: [x]  NO Hx DM   []  Pre-DM   []  DM1  []   DM2 Last A1c was:  5.1 normal on  04-22-24     Blood Thinner / Instructions:  None Aspirin  Instructions:  None  ERAS Protocol Ordered: []  No  [x]  Yes PRE-SURGERY [x]  ENSURE  []  G2  Patient is to be NPO after: 0700  Dental hx: []  Dentures:  []  N/A      [x]  Bridge or Partial: Has top and bottom partials which she will leave at home.                   []  Loose or Damaged teeth:   Comments: Patient was given the 5 CHG shower / bath instructions for THA surgery along with 2 bottles of the CHG soap. Patient will start this on:  05-23-24  Sunday            Activity level: Able to walk up 2 flights of stairs without becoming significantly short of breath or having chest pain?  [x]  No  []    Yes   Anesthesia review: Charcot-Marie-Tooth, Lupus(CLE), RA, Sjogrens, Asthma, HTN, PONV, GERD  Patient denies shortness of breath, fever, cough and chest pain at PAT appointment.  Patient verbalized understanding and agreement to the Pre-Surgical Instructions that were given to them at this PAT appointment. Patient was also educated of the need to review these PAT instructions again prior to her surgery.I reviewed the appropriate phone numbers to call if they have any and questions or concerns.

## 2024-05-17 ENCOUNTER — Encounter (HOSPITAL_COMMUNITY): Payer: Self-pay

## 2024-05-17 ENCOUNTER — Encounter (HOSPITAL_COMMUNITY)
Admission: RE | Admit: 2024-05-17 | Discharge: 2024-05-17 | Disposition: A | Discharge status: Home / Self Care | Source: Ambulatory Visit | Attending: Orthopedic Surgery | Admitting: Orthopedic Surgery

## 2024-05-17 ENCOUNTER — Other Ambulatory Visit: Payer: Self-pay

## 2024-05-17 VITALS — BP 120/78 | HR 69 | Temp 98.4°F | Resp 16 | Ht 68.0 in | Wt 173.0 lb

## 2024-05-17 DIAGNOSIS — Z79899 Other long term (current) drug therapy: Secondary | ICD-10-CM | POA: Diagnosis not present

## 2024-05-17 DIAGNOSIS — M1990 Unspecified osteoarthritis, unspecified site: Secondary | ICD-10-CM | POA: Diagnosis not present

## 2024-05-17 DIAGNOSIS — M1612 Unilateral primary osteoarthritis, left hip: Secondary | ICD-10-CM | POA: Insufficient documentation

## 2024-05-17 DIAGNOSIS — L931 Subacute cutaneous lupus erythematosus: Secondary | ICD-10-CM | POA: Insufficient documentation

## 2024-05-17 DIAGNOSIS — Z79631 Long term (current) use of antimetabolite agent: Secondary | ICD-10-CM | POA: Diagnosis not present

## 2024-05-17 DIAGNOSIS — J45909 Unspecified asthma, uncomplicated: Secondary | ICD-10-CM | POA: Insufficient documentation

## 2024-05-17 DIAGNOSIS — Z01818 Encounter for other preprocedural examination: Secondary | ICD-10-CM | POA: Diagnosis not present

## 2024-05-17 DIAGNOSIS — G600 Hereditary motor and sensory neuropathy: Secondary | ICD-10-CM | POA: Diagnosis not present

## 2024-05-17 DIAGNOSIS — F419 Anxiety disorder, unspecified: Secondary | ICD-10-CM | POA: Diagnosis not present

## 2024-05-17 DIAGNOSIS — I471 Supraventricular tachycardia, unspecified: Secondary | ICD-10-CM | POA: Insufficient documentation

## 2024-05-17 DIAGNOSIS — Z01812 Encounter for preprocedural laboratory examination: Secondary | ICD-10-CM | POA: Diagnosis present

## 2024-05-17 DIAGNOSIS — I10 Essential (primary) hypertension: Secondary | ICD-10-CM | POA: Insufficient documentation

## 2024-05-17 DIAGNOSIS — M3500 Sicca syndrome, unspecified: Secondary | ICD-10-CM | POA: Diagnosis not present

## 2024-05-17 DIAGNOSIS — I2510 Atherosclerotic heart disease of native coronary artery without angina pectoris: Secondary | ICD-10-CM | POA: Insufficient documentation

## 2024-05-17 DIAGNOSIS — F32A Depression, unspecified: Secondary | ICD-10-CM | POA: Diagnosis not present

## 2024-05-17 DIAGNOSIS — K219 Gastro-esophageal reflux disease without esophagitis: Secondary | ICD-10-CM | POA: Insufficient documentation

## 2024-05-17 DIAGNOSIS — Z01810 Encounter for preprocedural cardiovascular examination: Secondary | ICD-10-CM | POA: Diagnosis present

## 2024-05-17 LAB — COMPREHENSIVE METABOLIC PANEL WITH GFR
ALT: 14 U/L (ref 0–44)
AST: 18 U/L (ref 15–41)
Albumin: 3.7 g/dL (ref 3.5–5.0)
Alkaline Phosphatase: 95 U/L (ref 38–126)
Anion gap: 10 (ref 5–15)
BUN: 11 mg/dL (ref 8–23)
CO2: 24 mmol/L (ref 22–32)
Calcium: 9.1 mg/dL (ref 8.9–10.3)
Chloride: 103 mmol/L (ref 98–111)
Creatinine, Ser: 0.79 mg/dL (ref 0.44–1.00)
GFR, Estimated: >60 mL/min (ref >60)
Glucose, Bld: 92 mg/dL (ref 70–99)
Potassium: 3 mmol/L — ABNORMAL LOW (ref 3.5–5.1)
Sodium: 137 mmol/L (ref 135–145)
Total Bilirubin: 0.7 mg/dL (ref 0.0–1.2)
Total Protein: 6.8 g/dL (ref 6.5–8.1)

## 2024-05-17 LAB — CBC
HCT: 39.2 % (ref 36.0–46.0)
Hemoglobin: 13 g/dL (ref 12.0–15.0)
MCH: 34.4 pg — ABNORMAL HIGH (ref 26.0–34.0)
MCHC: 33.2 g/dL (ref 30.0–36.0)
MCV: 103.7 fL — ABNORMAL HIGH (ref 80.0–100.0)
Platelets: 226 10*3/uL (ref 150–400)
RBC: 3.78 MIL/uL — ABNORMAL LOW (ref 3.87–5.11)
RDW: 13.6 % (ref 11.5–15.5)
WBC: 4.9 10*3/uL (ref 4.0–10.5)
nRBC: 0 % (ref 0.0–0.2)

## 2024-05-17 LAB — SURGICAL PCR SCREEN
MRSA, PCR: NEGATIVE
Staphylococcus aureus: NEGATIVE

## 2024-05-18 NOTE — Progress Notes (Signed)
 Case: [id number] Date/Time: 05/27/24 0945   Procedure: ARTHROPLASTY, HIP, TOTAL, ANTERIOR APPROACH (Left: Hip)   Anesthesia type: Spinal   Diagnosis: Primary osteoarthritis of left hip [M16.12]   Pre-op diagnosis: Left hip osteoarthritis   Location: [facility] ROOM 09 / [facility]   Surgeons: Fidel Rogue, MD       DISCUSSION: Marie Calhoun is a 72 yo female who presents to PAT prior to surgery above. PMH of HTN, SVT, CAD (by CT), asthma, GERD, Lupus, Sjogren's syndrome, Charcot-Marie-Tooth disease, arthritis, anxiety, depression.  Prior anesthesia complications include PONV  Patient follows with Cardiology in [place], [place] for palpitations. She has done cardiac monitoring in the past which showed one 5 beat run of VT and multiple runs of SVT. She was advised to start a beta blocker and takes Toprol. Cardiac clearance letter, scanned in media on 6/12, states patient is cleared for surgery.  Patient follows with Pulmonology for possible ILD. Last CT scan on 04/30/23 did not show evidence of ILD. She uses inhalers as needed for asthma.  Patient follows with Rheumatology for lupus which is documented that it is just cutaneous. She is on methotrexate.   Medical clearance signed (scanned in media on 6/12) that patient is cleared and low risk.  VS: BP 120/78 Comment: right arm sitting  Pulse 69   Temp 36.9 C (Oral)   Resp 16   Ht 5' 8 (1.727 m)   Wt 78.5 kg   SpO2 98%   BMI 26.30 kg/m   PROVIDERS: Diedra Senior, MD Cardiologist - Mearl Gene, MD at [facility] [phone]) [phone] (Work)  Pulmonology- Harden Staff, MD  LABS: Labs reviewed: Acceptable for surgery. (all labs ordered are listed, but only abnormal results are displayed)  Labs Reviewed  COMPREHENSIVE METABOLIC PANEL WITH GFR - Abnormal; Notable for the following components:      Result Value   Potassium 3.0 (*)    All other components within normal limits  CBC - Abnormal; Notable for the following components:   RBC  3.78 (*)    MCV 103.7 (*)    MCH 34.4 (*)    All other components within normal limits  SURGICAL PCR SCREEN  TYPE AND SCREEN     IMAGES: CT Chest 04/30/23:  IMPRESSION: 1. No findings to suggest interstitial lung disease. 2. Mild air trapping indicative of mild small airways disease. 3. Aortic atherosclerosis, in addition to left main and three-vessel coronary artery disease. Please note that although the presence of coronary artery calcium  documents the presence of coronary artery disease, the severity of this disease and any potential stenosis cannot be assessed on this non-gated CT examination. Assessment for potential risk factor modification, dietary therapy or pharmacologic therapy may be warranted, if clinically indicated. 4. There are calcifications of the mitral annulus. Echocardiographic correlation for evaluation of potential valvular dysfunction may be warranted if clinically indicated.    EKG 05/17/24:  Normal sinus rhythm, rate 70 Normal ECG  CV:  Past Medical History:  Diagnosis Date   Anxiety    Arthritis    Asthma 05/28/2017   Cancer (HCC)    skin cancer   Charcot-Marie-Tooth disease    Depression    Dyspnea    with exertion   Environmental allergies 05/28/2017   Esophageal stricture 05/28/2017   Essential hypertension, benign 05/28/2017   GERD (gastroesophageal reflux disease) 05/28/2017   Hypercholesteremia    Lupus    of skin   PONV (postoperative nausea and vomiting)    Seasonal allergies  Sjogren's syndrome (HCC)     Past Surgical History:  Procedure Laterality Date   breast tumor\     left benign   CHOLECYSTECTOMY     COLONOSCOPY WITH PROPOFOL  N/A 05/30/2021   Procedure: COLONOSCOPY WITH PROPOFOL ;  Surgeon: Golda Claudis PENNER, MD;  [facility];  Service: Endoscopy;  Laterality: N/A;  855   ESOPHAGEAL DILATION N/A 09/11/2017   Procedure: ESOPHAGEAL DILATION;  Surgeon: Golda Claudis PENNER, MD;  [facility];   Service: Endoscopy;  Laterality: N/A;   ESOPHAGEAL DILATION N/A 06/17/2018   Procedure: ESOPHAGEAL DILATION;  Surgeon: Golda Claudis PENNER, MD;  [facility];  Service: Endoscopy;  Laterality: N/A;   ESOPHAGOGASTRODUODENOSCOPY N/A 09/11/2017   Procedure: ESOPHAGOGASTRODUODENOSCOPY (EGD);  Surgeon: Golda Claudis PENNER, MD;  [facility];  Service: Endoscopy;  Laterality: N/A;  1:40 - Can NOT come earlier   ESOPHAGOGASTRODUODENOSCOPY N/A 06/17/2018   Procedure: ESOPHAGOGASTRODUODENOSCOPY (EGD);  Surgeon: Golda Claudis PENNER, MD;  [facility];  Service: Endoscopy;  Laterality: N/A;  10:30   hand tendon release Left    KNEE ARTHROPLASTY Right 07/17/2022   Procedure: COMPUTER ASSISTED TOTAL KNEE ARTHROPLASTY;  Surgeon: Fidel Rogue, MD;  [facility];  Service: Orthopedics;  Laterality: Right;  150   KNEE ARTHROPLASTY Left 06/25/2023   Procedure: COMPUTER ASSISTED TOTAL KNEE ARTHROPLASTY;  Surgeon: Fidel Rogue, MD;  [facility];  Service: Orthopedics;  Laterality: Left;   knee surgeries     1980   LUMBAR EPIDURAL INJECTION     PARTIAL HYSTERECTOMY     1995   POLYPECTOMY  05/30/2021   Procedure: POLYPECTOMY;  Surgeon: Golda Claudis PENNER, MD;  [facility];  Service: Endoscopy;;   TOTAL HIP ARTHROPLASTY Right 05/30/2022   Procedure: TOTAL HIP ARTHROPLASTY ANTERIOR APPROACH;  Surgeon: Fidel Rogue, MD;  [facility];  Service: Orthopedics;  Laterality: Right;    MEDICATIONS:  acetaminophen  (TYLENOL ) 500 MG tablet   acyclovir  (ZOVIRAX ) 400 MG tablet   albuterol  (VENTOLIN  HFA) 108 (90 Base) MCG/ACT inhaler   amoxicillin (AMOXIL) 500 MG capsule   busPIRone (BUSPAR) 10 MG tablet   Calcium  Carb-Cholecalciferol  (CALCIUM  600 + D PO)   Cyanocobalamin  (B-12) 2500 MCG TABS   cycloSPORINE  (RESTASIS ) 0.05 % ophthalmic emulsion   fluticasone  (FLONASE ) 50 MCG/ACT nasal spray   folic acid  (FOLVITE ) 1 MG tablet   gabapentin  (NEURONTIN ) 300 MG capsule    ketoconazole (NIZORAL) 2 % cream   lansoprazole  (PREVACID ) 30 MG capsule   LINZESS  145 MCG CAPS capsule   losartan -hydrochlorothiazide  (HYZAAR) 50-12.5 MG tablet   methotrexate (RHEUMATREX) 5 MG tablet   metoprolol succinate (TOPROL-XL) 25 MG 24 hr tablet   mineral oil light external liquid   Multiple Vitamin (MULTIVITAMIN WITH MINERALS) TABS tablet   MYRBETRIQ  25 MG TB24 tablet   ondansetron  (ZOFRAN ) 4 MG tablet   polyethylene glycol (MIRALAX  / GLYCOLAX ) 17 g packet   predniSONE (DELTASONE) 5 MG tablet   silver  sulfADIAZINE  (SILVADENE ) 1 % cream   triamcinolone  cream (KENALOG ) 0.1 %   No current facility-administered medications for this encounter.   Burnard CHRISTELLA Odis DEVONNA [facility] Short Stay/Anesthesiology [facility] Phone [phone] 05/18/2024 1:58 PM

## 2024-05-18 NOTE — Anesthesia Preprocedure Evaluation (Addendum)
 Anesthesia Evaluation  Patient identified by MRN, date of birth, ID band Patient awake    Reviewed: Allergy & Precautions, NPO status , Patient's Chart, lab work & pertinent test results  History of Anesthesia Complications (+) PONV and history of anesthetic complications  Airway Mallampati: II  TM Distance: >3 FB Neck ROM: Full    Dental no notable dental hx. (+) Partial Upper, Partial Lower, Poor Dentition   Pulmonary shortness of breath and with exertion, asthma    Pulmonary exam normal breath sounds clear to auscultation       Cardiovascular hypertension, (-) Past MI Normal cardiovascular exam Rhythm:Regular Rate:Normal     Neuro/Psych  PSYCHIATRIC DISORDERS Anxiety Depression    Charcot Marie Tooth disease  Neuromuscular disease    GI/Hepatic Neg liver ROS,GERD  Medicated,,  Endo/Other  negative endocrine ROS    Renal/GU   negative genitourinary   Musculoskeletal  (+) Arthritis , Osteoarthritis,    Abdominal   Peds  Hematology Sjogren's disease    Anesthesia Other Findings Lupus- Cutaneous  Reproductive/Obstetrics                              Anesthesia Physical Anesthesia Plan  ASA: 3  Anesthesia Plan: Spinal   Post-op Pain Management: Tylenol  PO (pre-op)*   Induction:   PONV Risk Score and Plan: 4 or greater and Treatment may vary due to age or medical condition and Propofol  infusion  Airway Management Planned: Natural Airway and Simple Face Mask  Additional Equipment: None  Intra-op Plan:   Post-operative Plan:   Informed Consent: I have reviewed the patients History and Physical, chart, labs and discussed the procedure including the risks, benefits and alternatives for the proposed anesthesia with the patient or authorized representative who has indicated his/her understanding and acceptance.     Dental advisory given  Plan Discussed with: CRNA and  Anesthesiologist  Anesthesia Plan Comments: (See PAT note from 6/30  Marie Calhoun is a 73 yo female who presents to PAT prior to surgery above. PMH of HTN, SVT, CAD (by CT), asthma, GERD, Lupus, Sjogren's syndrome, Charcot-Marie-Tooth disease, arthritis, anxiety, depression.   Prior anesthesia complications include PONV   Patient follows with Cardiology in [place], [place] for palpitations. She has done cardiac monitoring in the past which showed one 5 beat run of VT and multiple runs of SVT. She was advised to start a beta blocker and takes Toprol . Cardiac clearance letter, scanned in media on 6/12, states patient is cleared for surgery.   Patient follows with Pulmonology for possible ILD. Last CT scan on 04/30/23 did not show evidence of ILD. She uses inhalers as needed for asthma.   Patient follows with Rheumatology for lupus which is documented that it is just cutaneous. She is on methotrexate.  )         Anesthesia Quick Evaluation

## 2024-05-27 ENCOUNTER — Encounter (HOSPITAL_COMMUNITY)
Admission: RE | Disposition: A | Discharge status: Home / Self Care | Payer: Self-pay | Source: Home / Self Care | Attending: Orthopedic Surgery

## 2024-05-27 ENCOUNTER — Encounter (HOSPITAL_COMMUNITY): Payer: Self-pay | Admitting: Orthopedic Surgery

## 2024-05-27 ENCOUNTER — Ambulatory Visit (HOSPITAL_COMMUNITY)

## 2024-05-27 ENCOUNTER — Other Ambulatory Visit: Payer: Self-pay

## 2024-05-27 ENCOUNTER — Ambulatory Visit (HOSPITAL_COMMUNITY): Payer: Self-pay | Admitting: Medical

## 2024-05-27 ENCOUNTER — Observation Stay (HOSPITAL_COMMUNITY)
Admission: RE | Admit: 2024-05-27 | Discharge: 2024-05-30 | Disposition: A | Discharge status: Home / Self Care | Attending: Orthopedic Surgery | Admitting: Orthopedic Surgery

## 2024-05-27 DIAGNOSIS — M1612 Unilateral primary osteoarthritis, left hip: Secondary | ICD-10-CM

## 2024-05-27 DIAGNOSIS — F418 Other specified anxiety disorders: Secondary | ICD-10-CM | POA: Diagnosis not present

## 2024-05-27 DIAGNOSIS — Z96641 Presence of right artificial hip joint: Secondary | ICD-10-CM | POA: Insufficient documentation

## 2024-05-27 DIAGNOSIS — I10 Essential (primary) hypertension: Secondary | ICD-10-CM | POA: Diagnosis not present

## 2024-05-27 DIAGNOSIS — Z96642 Presence of left artificial hip joint: Principal | ICD-10-CM | POA: Diagnosis present

## 2024-05-27 DIAGNOSIS — Z96653 Presence of artificial knee joint, bilateral: Secondary | ICD-10-CM | POA: Diagnosis not present

## 2024-05-27 DIAGNOSIS — Z79899 Other long term (current) drug therapy: Secondary | ICD-10-CM | POA: Insufficient documentation

## 2024-05-27 DIAGNOSIS — Z85828 Personal history of other malignant neoplasm of skin: Secondary | ICD-10-CM | POA: Diagnosis not present

## 2024-05-27 DIAGNOSIS — J45909 Unspecified asthma, uncomplicated: Secondary | ICD-10-CM | POA: Diagnosis not present

## 2024-05-27 HISTORY — PX: TOTAL HIP ARTHROPLASTY: SHX124

## 2024-05-27 LAB — TYPE AND SCREEN
ABO/RH(D): O NEG
Antibody Screen: NEGATIVE

## 2024-05-27 SURGERY — ARTHROPLASTY, HIP, TOTAL, ANTERIOR APPROACH
Anesthesia: Spinal | Site: Hip | Laterality: Left

## 2024-05-27 MED ORDER — OXYCODONE HCL 5 MG PO TABS: 5.0000 mg | ORAL_TABLET | Freq: Once | ORAL | Status: DC | PRN

## 2024-05-27 MED ORDER — SENNA 8.6 MG PO TABS
1.0000 | ORAL_TABLET | Freq: Two times a day (BID) | ORAL | Status: DC
  Administered 2024-05-27 – 2024-05-30 (×6): 8.6 mg via ORAL
  Filled 2024-05-27 (×6): qty 1

## 2024-05-27 MED ORDER — ALBUTEROL SULFATE HFA 108 (90 BASE) MCG/ACT IN AERS: 2.0000 | INHALATION_SPRAY | Freq: Four times a day (QID) | RESPIRATORY_TRACT | Status: DC | PRN

## 2024-05-27 MED ORDER — PROPOFOL 1000 MG/100ML IV EMUL
INTRAVENOUS | Status: AC
  Filled 2024-05-27: qty 100

## 2024-05-27 MED ORDER — BISACODYL 10 MG RE SUPP: 10.0000 mg | Freq: Every day | RECTAL | Status: DC | PRN

## 2024-05-27 MED ORDER — LACTATED RINGERS IV SOLN: INTRAVENOUS | Status: DC

## 2024-05-27 MED ORDER — POLYETHYLENE GLYCOL 3350 17 G PO PACK: 17.0000 g | PACK | Freq: Every day | ORAL | Status: DC | PRN

## 2024-05-27 MED ORDER — WATER FOR IRRIGATION, STERILE IR SOLN
Status: DC | PRN
  Administered 2024-05-27: 2000 mL

## 2024-05-27 MED ORDER — PROPOFOL 10 MG/ML IV BOLUS
INTRAVENOUS | Status: DC | PRN
  Administered 2024-05-27: 150 mg via INTRAVENOUS
  Administered 2024-05-27: 20 mg via INTRAVENOUS

## 2024-05-27 MED ORDER — DIPHENHYDRAMINE HCL 12.5 MG/5ML PO ELIX
12.5000 mg | ORAL_SOLUTION | ORAL | Status: DC | PRN
  Administered 2024-05-27 – 2024-05-29 (×3): 25 mg via ORAL
  Filled 2024-05-27 (×3): qty 10

## 2024-05-27 MED ORDER — HYDROMORPHONE HCL 1 MG/ML IJ SOLN
0.5000 mg | INTRAMUSCULAR | Status: DC | PRN
  Filled 2024-05-27 (×2): qty 1

## 2024-05-27 MED ORDER — GABAPENTIN 300 MG PO CAPS
300.0000 mg | ORAL_CAPSULE | Freq: Two times a day (BID) | ORAL | Status: DC
  Administered 2024-05-27 – 2024-05-30 (×7): 300 mg via ORAL
  Filled 2024-05-27 (×7): qty 1

## 2024-05-27 MED ORDER — TRANEXAMIC ACID-NACL 1000-0.7 MG/100ML-% IV SOLN
1000.0000 mg | INTRAVENOUS | Status: AC
  Administered 2024-05-27: 1000 mg via INTRAVENOUS
  Filled 2024-05-27: qty 100

## 2024-05-27 MED ORDER — HYDROMORPHONE HCL 2 MG/ML IJ SOLN
INTRAMUSCULAR | Status: AC
  Filled 2024-05-27: qty 1

## 2024-05-27 MED ORDER — DOCUSATE SODIUM 100 MG PO CAPS
100.0000 mg | ORAL_CAPSULE | Freq: Two times a day (BID) | ORAL | Status: DC
  Administered 2024-05-27 – 2024-05-30 (×6): 100 mg via ORAL
  Filled 2024-05-27 (×6): qty 1

## 2024-05-27 MED ORDER — ALUM & MAG HYDROXIDE-SIMETH 200-200-20 MG/5ML PO SUSP: 30.0000 mL | ORAL | Status: DC | PRN

## 2024-05-27 MED ORDER — MIDAZOLAM HCL 5 MG/5ML IJ SOLN
INTRAMUSCULAR | Status: DC | PRN
  Administered 2024-05-27: 2 mg via INTRAVENOUS

## 2024-05-27 MED ORDER — BUSPIRONE HCL 10 MG PO TABS
10.0000 mg | ORAL_TABLET | Freq: Two times a day (BID) | ORAL | Status: DC
  Administered 2024-05-27 – 2024-05-30 (×6): 10 mg via ORAL
  Filled 2024-05-27 (×6): qty 1

## 2024-05-27 MED ORDER — GABAPENTIN 300 MG PO CAPS: 300.0000 mg | ORAL_CAPSULE | ORAL | Status: DC

## 2024-05-27 MED ORDER — ONDANSETRON HCL 4 MG/2ML IJ SOLN: 4.0000 mg | Freq: Four times a day (QID) | INTRAMUSCULAR | Status: DC | PRN

## 2024-05-27 MED ORDER — ACETAMINOPHEN 325 MG PO TABS: 325.0000 mg | ORAL_TABLET | Freq: Four times a day (QID) | ORAL | Status: DC | PRN

## 2024-05-27 MED ORDER — PHENOL 1.4 % MT LIQD: 1.0000 | OROMUCOSAL | Status: DC | PRN

## 2024-05-27 MED ORDER — FLUTICASONE PROPIONATE 50 MCG/ACT NA SUSP: 1.0000 | Freq: Every day | NASAL | Status: DC | PRN

## 2024-05-27 MED ORDER — SODIUM CHLORIDE 0.9 % IR SOLN
Status: DC | PRN
  Administered 2024-05-27: 3000 mL

## 2024-05-27 MED ORDER — PROPOFOL 500 MG/50ML IV EMUL
INTRAVENOUS | Status: DC | PRN
  Administered 2024-05-27: 100 ug/kg/min via INTRAVENOUS

## 2024-05-27 MED ORDER — MENTHOL 3 MG MT LOZG: 1.0000 | LOZENGE | OROMUCOSAL | Status: DC | PRN

## 2024-05-27 MED ORDER — 0.9 % SODIUM CHLORIDE (POUR BTL) OPTIME
TOPICAL | Status: DC | PRN
  Administered 2024-05-27: 1000 mL

## 2024-05-27 MED ORDER — ADULT MULTIVITAMIN W/MINERALS CH
1.0000 | ORAL_TABLET | Freq: Every day | ORAL | Status: DC
  Administered 2024-05-28 – 2024-05-30 (×3): 1 via ORAL
  Filled 2024-05-27 (×3): qty 1

## 2024-05-27 MED ORDER — BUPIVACAINE IN DEXTROSE 0.75-8.25 % IT SOLN
INTRATHECAL | Status: DC | PRN
  Administered 2024-05-27: 1.6 mL via INTRATHECAL

## 2024-05-27 MED ORDER — LINACLOTIDE 145 MCG PO CAPS
145.0000 ug | ORAL_CAPSULE | Freq: Every day | ORAL | Status: DC
  Administered 2024-05-28 – 2024-05-30 (×3): 145 ug via ORAL
  Filled 2024-05-27 (×3): qty 1

## 2024-05-27 MED ORDER — ACETAMINOPHEN 500 MG PO TABS
1000.0000 mg | ORAL_TABLET | Freq: Once | ORAL | Status: DC
  Filled 2024-05-27: qty 2

## 2024-05-27 MED ORDER — ONDANSETRON HCL 4 MG/2ML IJ SOLN
INTRAMUSCULAR | Status: DC | PRN
Start: 2024-05-27 — End: 2024-05-27
  Administered 2024-05-27: 4 mg via INTRAVENOUS

## 2024-05-27 MED ORDER — ORAL CARE MOUTH RINSE
15.0000 mL | Freq: Once | OROMUCOSAL | Status: AC
Start: 2024-05-27 — End: 2024-05-27

## 2024-05-27 MED ORDER — ONDANSETRON HCL 4 MG PO TABS
4.0000 mg | ORAL_TABLET | Freq: Four times a day (QID) | ORAL | Status: DC | PRN
  Administered 2024-05-29: 4 mg via ORAL
  Filled 2024-05-27: qty 1

## 2024-05-27 MED ORDER — POVIDONE-IODINE 10 % EX SWAB: 2.0000 | Freq: Once | CUTANEOUS | Status: DC

## 2024-05-27 MED ORDER — CEFAZOLIN SODIUM-DEXTROSE 2-4 GM/100ML-% IV SOLN
2.0000 g | INTRAVENOUS | Status: AC
  Administered 2024-05-27: 2 g via INTRAVENOUS
  Filled 2024-05-27: qty 100

## 2024-05-27 MED ORDER — MIDAZOLAM HCL 2 MG/2ML IJ SOLN
INTRAMUSCULAR | Status: AC
  Filled 2024-05-27: qty 2

## 2024-05-27 MED ORDER — FENTANYL CITRATE PF 50 MCG/ML IJ SOSY
25.0000 ug | PREFILLED_SYRINGE | INTRAMUSCULAR | Status: DC | PRN
  Administered 2024-05-27 (×2): 50 ug via INTRAVENOUS

## 2024-05-27 MED ORDER — CHLORHEXIDINE GLUCONATE 0.12 % MT SOLN
15.0000 mL | Freq: Once | OROMUCOSAL | Status: AC
Start: 2024-05-27 — End: 2024-05-27
  Administered 2024-05-27: 15 mL via OROMUCOSAL

## 2024-05-27 MED ORDER — CEFAZOLIN SODIUM-DEXTROSE 2-4 GM/100ML-% IV SOLN
2.0000 g | Freq: Four times a day (QID) | INTRAVENOUS | Status: AC
  Administered 2024-05-27 (×2): 2 g via INTRAVENOUS
  Filled 2024-05-27 (×2): qty 100

## 2024-05-27 MED ORDER — KETOROLAC TROMETHAMINE 15 MG/ML IJ SOLN
7.5000 mg | Freq: Four times a day (QID) | INTRAMUSCULAR | Status: DC
Start: 2024-05-27 — End: 2024-05-28
  Administered 2024-05-27 – 2024-05-28 (×3): 7.5 mg via INTRAVENOUS
  Filled 2024-05-27 (×3): qty 1

## 2024-05-27 MED ORDER — ONDANSETRON HCL 4 MG/2ML IJ SOLN
INTRAMUSCULAR | Status: AC
Start: 2024-05-27 — End: 2024-05-27
  Filled 2024-05-27: qty 2

## 2024-05-27 MED ORDER — METOCLOPRAMIDE HCL 5 MG/ML IJ SOLN: 5.0000 mg | Freq: Three times a day (TID) | INTRAMUSCULAR | Status: DC | PRN

## 2024-05-27 MED ORDER — PHENYLEPHRINE HCL-NACL 20-0.9 MG/250ML-% IV SOLN
INTRAVENOUS | Status: AC
  Filled 2024-05-27: qty 250

## 2024-05-27 MED ORDER — DEXAMETHASONE SODIUM PHOSPHATE 10 MG/ML IJ SOLN
INTRAMUSCULAR | Status: AC
  Filled 2024-05-27: qty 1

## 2024-05-27 MED ORDER — BUPIVACAINE-EPINEPHRINE (PF) 0.25% -1:200000 IJ SOLN
INTRAMUSCULAR | Status: AC
  Filled 2024-05-27: qty 30

## 2024-05-27 MED ORDER — DEXAMETHASONE SODIUM PHOSPHATE 10 MG/ML IJ SOLN
INTRAMUSCULAR | Status: DC | PRN
  Administered 2024-05-27: 10 mg via INTRAVENOUS

## 2024-05-27 MED ORDER — ALBUTEROL SULFATE (2.5 MG/3ML) 0.083% IN NEBU: 2.5000 mg | INHALATION_SOLUTION | Freq: Four times a day (QID) | RESPIRATORY_TRACT | Status: DC | PRN

## 2024-05-27 MED ORDER — SODIUM CHLORIDE (PF) 0.9 % IJ SOLN
INTRAMUSCULAR | Status: DC | PRN
  Administered 2024-05-27: 61 mL

## 2024-05-27 MED ORDER — METOPROLOL SUCCINATE ER 25 MG PO TB24
25.0000 mg | ORAL_TABLET | Freq: Two times a day (BID) | ORAL | Status: DC
  Administered 2024-05-27 – 2024-05-30 (×6): 25 mg via ORAL
  Filled 2024-05-27 (×6): qty 1

## 2024-05-27 MED ORDER — ASPIRIN 81 MG PO CHEW
81.0000 mg | CHEWABLE_TABLET | Freq: Two times a day (BID) | ORAL | Status: DC
  Administered 2024-05-27 – 2024-05-30 (×6): 81 mg via ORAL
  Filled 2024-05-27 (×6): qty 1

## 2024-05-27 MED ORDER — FENTANYL CITRATE (PF) 100 MCG/2ML IJ SOLN
INTRAMUSCULAR | Status: AC
  Filled 2024-05-27: qty 2

## 2024-05-27 MED ORDER — SODIUM CHLORIDE 0.9 % IV SOLN: INTRAVENOUS | Status: DC

## 2024-05-27 MED ORDER — OXYCODONE HCL 5 MG/5ML PO SOLN: 5.0000 mg | Freq: Once | ORAL | Status: DC | PRN

## 2024-05-27 MED ORDER — ISOPROPYL ALCOHOL 70 % SOLN
Status: DC | PRN
  Administered 2024-05-27: 1 via TOPICAL

## 2024-05-27 MED ORDER — OXYCODONE HCL 5 MG PO TABS
10.0000 mg | ORAL_TABLET | ORAL | Status: DC | PRN
  Administered 2024-05-28 (×2): 10 mg via ORAL
  Administered 2024-05-29 (×3): 15 mg via ORAL
  Administered 2024-05-30 (×2): 10 mg via ORAL
  Filled 2024-05-27 (×2): qty 3
  Filled 2024-05-27: qty 2
  Filled 2024-05-27: qty 3
  Filled 2024-05-27: qty 2

## 2024-05-27 MED ORDER — ISOPROPYL ALCOHOL 70 % SOLN
Status: AC
Start: 2024-05-27 — End: 2024-05-27
  Filled 2024-05-27: qty 480

## 2024-05-27 MED ORDER — METHOCARBAMOL 500 MG PO TABS
500.0000 mg | ORAL_TABLET | Freq: Four times a day (QID) | ORAL | Status: DC | PRN
  Administered 2024-05-28 – 2024-05-30 (×7): 500 mg via ORAL
  Filled 2024-05-27 (×8): qty 1

## 2024-05-27 MED ORDER — ACYCLOVIR 400 MG PO TABS: 400.0000 mg | ORAL_TABLET | Freq: Three times a day (TID) | ORAL | Status: DC | PRN

## 2024-05-27 MED ORDER — SODIUM CHLORIDE (PF) 0.9 % IJ SOLN
INTRAMUSCULAR | Status: AC
  Filled 2024-05-27: qty 30

## 2024-05-27 MED ORDER — ACETAMINOPHEN 500 MG PO TABS
1000.0000 mg | ORAL_TABLET | Freq: Four times a day (QID) | ORAL | Status: AC
  Administered 2024-05-27 – 2024-05-28 (×4): 1000 mg via ORAL
  Filled 2024-05-27 (×4): qty 2

## 2024-05-27 MED ORDER — HYDROMORPHONE HCL 1 MG/ML IJ SOLN
INTRAMUSCULAR | Status: DC | PRN
  Administered 2024-05-27: .5 mg via INTRAVENOUS
  Administered 2024-05-27: 1 mg via INTRAVENOUS
  Administered 2024-05-27: .5 mg via INTRAVENOUS

## 2024-05-27 MED ORDER — GABAPENTIN 300 MG PO CAPS
600.0000 mg | ORAL_CAPSULE | Freq: Every day | ORAL | Status: DC
  Administered 2024-05-27 – 2024-05-28 (×2): 600 mg via ORAL
  Filled 2024-05-27 (×3): qty 2

## 2024-05-27 MED ORDER — OXYCODONE HCL 5 MG PO TABS
5.0000 mg | ORAL_TABLET | ORAL | Status: DC | PRN
  Administered 2024-05-28: 5 mg via ORAL
  Administered 2024-05-30: 10 mg via ORAL
  Filled 2024-05-27: qty 2
  Filled 2024-05-27: qty 1
  Filled 2024-05-27 (×2): qty 2

## 2024-05-27 MED ORDER — DROPERIDOL 2.5 MG/ML IJ SOLN: 0.6250 mg | Freq: Once | INTRAMUSCULAR | Status: DC | PRN

## 2024-05-27 MED ORDER — METHOCARBAMOL 1000 MG/10ML IJ SOLN: 500.0000 mg | Freq: Four times a day (QID) | INTRAMUSCULAR | Status: DC | PRN

## 2024-05-27 MED ORDER — FENTANYL CITRATE (PF) 100 MCG/2ML IJ SOLN
INTRAMUSCULAR | Status: DC | PRN
  Administered 2024-05-27 (×2): 50 ug via INTRAVENOUS

## 2024-05-27 MED ORDER — MIRABEGRON ER 25 MG PO TB24
25.0000 mg | ORAL_TABLET | Freq: Every day | ORAL | Status: DC
Start: 2024-05-27 — End: 2024-05-30
  Administered 2024-05-27 – 2024-05-29 (×3): 25 mg via ORAL
  Filled 2024-05-27 (×4): qty 1

## 2024-05-27 MED ORDER — METOCLOPRAMIDE HCL 5 MG PO TABS: 5.0000 mg | ORAL_TABLET | Freq: Three times a day (TID) | ORAL | Status: DC | PRN

## 2024-05-27 MED ORDER — SILVER SULFADIAZINE 1 % EX CREA: 1.0000 | TOPICAL_CREAM | Freq: Every day | CUTANEOUS | Status: DC | PRN

## 2024-05-27 MED ORDER — CYCLOSPORINE 0.05 % OP EMUL
1.0000 [drp] | Freq: Two times a day (BID) | OPHTHALMIC | Status: DC
  Administered 2024-05-27 – 2024-05-30 (×6): 1 [drp] via OPHTHALMIC
  Filled 2024-05-27 (×7): qty 30

## 2024-05-27 MED ORDER — ACETAMINOPHEN 10 MG/ML IV SOLN: 1000.0000 mg | Freq: Once | INTRAVENOUS | Status: DC | PRN

## 2024-05-27 MED ORDER — FENTANYL CITRATE PF 50 MCG/ML IJ SOSY
PREFILLED_SYRINGE | INTRAMUSCULAR | Status: AC
  Filled 2024-05-27: qty 3

## 2024-05-27 MED ORDER — KETOROLAC TROMETHAMINE 30 MG/ML IJ SOLN
INTRAMUSCULAR | Status: AC
  Filled 2024-05-27: qty 1

## 2024-05-27 SURGICAL SUPPLY — 49 items
BAG COUNTER SPONGE SURGICOUNT (BAG) IMPLANT
BAG ZIPLOCK 12X15 (MISCELLANEOUS) IMPLANT
BLADE SAW RECIPROCATING 77.5 (BLADE) ×1 IMPLANT
CHLORAPREP W/TINT 26 (MISCELLANEOUS) ×1 IMPLANT
COVER PERINEAL POST (MISCELLANEOUS) ×1 IMPLANT
COVER SURGICAL LIGHT HANDLE (MISCELLANEOUS) ×1 IMPLANT
DERMABOND ADVANCED .7 DNX12 (GAUZE/BANDAGES/DRESSINGS) ×2 IMPLANT
DRAPE IMP U-DRAPE 54X76 (DRAPES) ×1 IMPLANT
DRAPE SHEET LG 3/4 BI-LAMINATE (DRAPES) ×3 IMPLANT
DRAPE STERI IOBAN 125X83 (DRAPES) ×1 IMPLANT
DRAPE U-SHAPE 47X51 STRL (DRAPES) ×2 IMPLANT
DRSG AQUACEL AG ADV 3.5X10 (GAUZE/BANDAGES/DRESSINGS) ×1 IMPLANT
ELECT REM PT RETURN 15FT ADLT (MISCELLANEOUS) ×1 IMPLANT
GAUZE SPONGE 4X4 12PLY STRL (GAUZE/BANDAGES/DRESSINGS) ×1 IMPLANT
GLOVE BIO SURGEON STRL SZ7 (GLOVE) ×1 IMPLANT
GLOVE BIO SURGEON STRL SZ8.5 (GLOVE) ×2 IMPLANT
GLOVE BIOGEL PI IND STRL 7.5 (GLOVE) ×1 IMPLANT
GLOVE BIOGEL PI IND STRL 8.5 (GLOVE) ×1 IMPLANT
GOWN SPEC L3 XXLG W/TWL (GOWN DISPOSABLE) ×1 IMPLANT
GOWN STRL REUS W/ TWL XL LVL3 (GOWN DISPOSABLE) ×1 IMPLANT
HEAD CERAMIC BIOLOX 36MM (Head) IMPLANT
HOLDER FOLEY CATH W/STRAP (MISCELLANEOUS) ×1 IMPLANT
HOOD PEEL AWAY T7 (MISCELLANEOUS) ×3 IMPLANT
KIT TURNOVER KIT A (KITS) ×1 IMPLANT
LINER ACETAB NN G7 F 36 (Liner) IMPLANT
MANIFOLD NEPTUNE II (INSTRUMENTS) ×1 IMPLANT
MARKER SKIN DUAL TIP RULER LAB (MISCELLANEOUS) ×1 IMPLANT
NDL SAFETY ECLIPSE 18X1.5 (NEEDLE) ×1 IMPLANT
NDL SPNL 18GX3.5 QUINCKE PK (NEEDLE) ×1 IMPLANT
NEEDLE SPNL 18GX3.5 QUINCKE PK (NEEDLE) ×1 IMPLANT
PACK ANTERIOR HIP CUSTOM (KITS) ×1 IMPLANT
PENCIL SMOKE EVACUATOR (MISCELLANEOUS) ×1 IMPLANT
SEALER BIPOLAR AQUA 6.0 (INSTRUMENTS) ×1 IMPLANT
SET HNDPC FAN SPRY TIP SCT (DISPOSABLE) ×1 IMPLANT
SHELL ACETAB 54 4H HIP (Shell) IMPLANT
SLEEVE HIP BIOLOX -6MM OFFSET (Sleeve) IMPLANT
SOLUTION PRONTOSAN WOUND 350ML (IRRIGATION / IRRIGATOR) ×1 IMPLANT
SPIKE FLUID TRANSFER (MISCELLANEOUS) ×1 IMPLANT
STEM FEM SZ18 121X42.6 133D (Stem) IMPLANT
SUT MNCRL AB 3-0 PS2 18 (SUTURE) ×1 IMPLANT
SUT MON AB 2-0 CT1 36 (SUTURE) ×1 IMPLANT
SUT STRATAFIX 14 PDO 48 VLT (SUTURE) ×1 IMPLANT
SUT VIC AB 2-0 CT1 TAPERPNT 27 (SUTURE) IMPLANT
SYR 3ML LL SCALE MARK (SYRINGE) ×1 IMPLANT
TOWEL GREEN STERILE FF (TOWEL DISPOSABLE) ×1 IMPLANT
TRAY FOLEY MTR SLVR 14FR STAT (SET/KITS/TRAYS/PACK) IMPLANT
TRAY FOLEY MTR SLVR 16FR STAT (SET/KITS/TRAYS/PACK) IMPLANT
TUBE SUCTION HIGH CAP CLEAR NV (SUCTIONS) ×1 IMPLANT
WATER STERILE IRR 1000ML POUR (IV SOLUTION) ×1 IMPLANT

## 2024-05-27 NOTE — Anesthesia Procedure Notes (Signed)
 Procedure Name: LMA Insertion Date/Time: 05/27/2024 11:09 AM  Performed by: Elim Economou D, CRNAPre-anesthesia Checklist: Patient identified, Emergency Drugs available, Suction available and Patient being monitored Patient Re-evaluated:Patient Re-evaluated prior to induction Oxygen Delivery Method: Circle system utilized Preoxygenation: Pre-oxygenation with 100% oxygen Induction Type: IV induction Ventilation: Mask ventilation without difficulty LMA: LMA inserted LMA Size: 4.0 Tube type: Oral Number of attempts: 1 Placement Confirmation: positive ETCO2 and breath sounds checked- equal and bilateral Tube secured with: Tape Dental Injury: Teeth and Oropharynx as per pre-operative assessment

## 2024-05-27 NOTE — Plan of Care (Signed)
   Problem: Activity: Goal: Risk for activity intolerance will decrease Outcome: Progressing   Problem: Pain Managment: Goal: General experience of comfort will improve and/or be controlled Outcome: Progressing   Problem: Safety: Goal: Ability to remain free from injury will improve Outcome: Progressing

## 2024-05-27 NOTE — Discharge Instructions (Signed)

## 2024-05-27 NOTE — Interval H&P Note (Signed)
 History and Physical Interval Note:  05/27/2024 8:07 AM  Perline Chopp  has presented today for surgery, with the diagnosis of Left hip osteoarthritis.  The various methods of treatment have been discussed with the patient and family. After consideration of risks, benefits and other options for treatment, the patient has consented to  Procedure(s): ARTHROPLASTY, HIP, TOTAL, ANTERIOR APPROACH (Left) as a surgical intervention.  The patient's history has been reviewed, patient examined, no change in status, stable for surgery.  I have reviewed the patient's chart and labs.  Questions were answered to the patient's satisfaction.     Redell PARAS Nechuma Boven

## 2024-05-27 NOTE — Plan of Care (Signed)
  Problem: Education: Goal: Knowledge of General Education information will improve Description: Including pain rating scale, medication(s)/side effects and non-pharmacologic comfort measures Outcome: Progressing   Problem: Health Behavior/Discharge Planning: Goal: Ability to manage health-related needs will improve Outcome: Progressing   Problem: Clinical Measurements: Goal: Ability to maintain clinical measurements within normal limits will improve Outcome: Progressing Goal: Will remain free from infection Outcome: Progressing Goal: Diagnostic test results will improve Outcome: Progressing Goal: Respiratory complications will improve Outcome: Progressing Goal: Cardiovascular complication will be avoided Outcome: Progressing   Problem: Activity: Goal: Risk for activity intolerance will decrease Outcome: Progressing   Problem: Nutrition: Goal: Adequate nutrition will be maintained Outcome: Completed/Met   Problem: Coping: Goal: Level of anxiety will decrease Outcome: Progressing   Problem: Elimination: Goal: Will not experience complications related to bowel motility Outcome: Progressing Goal: Will not experience complications related to urinary retention Outcome: Progressing   Problem: Pain Managment: Goal: General experience of comfort will improve and/or be controlled Outcome: Progressing   Problem: Safety: Goal: Ability to remain free from injury will improve Outcome: Progressing   Problem: Skin Integrity: Goal: Risk for impaired skin integrity will decrease Outcome: Progressing   Problem: Education: Goal: Knowledge of the prescribed therapeutic regimen will improve Outcome: Adequate for Discharge Goal: Understanding of discharge needs will improve Outcome: Progressing Goal: Individualized Educational Video(s) Outcome: Completed/Met   Problem: Activity: Goal: Ability to avoid complications of mobility impairment will improve Outcome: Progressing Goal:  Ability to tolerate increased activity will improve Outcome: Progressing   Problem: Clinical Measurements: Goal: Postoperative complications will be avoided or minimized Outcome: Progressing   Problem: Pain Management: Goal: Pain level will decrease with appropriate interventions Outcome: Progressing   Problem: Skin Integrity: Goal: Will show signs of wound healing Outcome: Progressing

## 2024-05-27 NOTE — Transfer of Care (Signed)
 Immediate Anesthesia Transfer of Care Note  Patient: Marie Calhoun  Procedure(s) Performed: ARTHROPLASTY, HIP, TOTAL, ANTERIOR APPROACH (Left: Hip)  Patient Location: PACU  Anesthesia Type:General and Spinal  Level of Consciousness: awake, alert , and oriented  Airway & Oxygen Therapy: Patient Spontanous Breathing and Patient connected to face mask oxygen  Post-op Assessment: Report given to RN and Post -op Vital signs reviewed and stable  Post vital signs: Reviewed and stable  Last Vitals:  Vitals Value Taken Time  BP 157/106 05/27/24 12:45  Temp    Pulse 86 05/27/24 12:46  Resp 12 05/27/24 12:46  SpO2 95 % 05/27/24 12:46  Vitals shown include unfiled device data.  Last Pain:  Vitals:   05/27/24 0759  TempSrc: Oral  PainSc:          Complications: No notable events documented.

## 2024-05-27 NOTE — Anesthesia Postprocedure Evaluation (Signed)
 Anesthesia Post Note  Patient: Marie Calhoun  Procedure(s) Performed: ARTHROPLASTY, HIP, TOTAL, ANTERIOR APPROACH (Left: Hip)     Patient location during evaluation: PACU Anesthesia Type: Spinal and General Level of consciousness: awake and alert Pain management: pain level controlled Vital Signs Assessment: post-procedure vital signs reviewed and stable Respiratory status: spontaneous breathing, nonlabored ventilation, respiratory function stable and patient connected to nasal cannula oxygen Cardiovascular status: blood pressure returned to baseline and stable Postop Assessment: no apparent nausea or vomiting Anesthetic complications: no   No notable events documented.  Last Vitals:  Vitals:   05/27/24 1345 05/27/24 1400  BP: (!) 154/99 (!) 166/98  Pulse: 76 74  Resp: 19 18  Temp:    SpO2: 95% 97%    Last Pain:  Vitals:   05/27/24 1400  TempSrc:   PainSc: Asleep                 Thom JONELLE Peoples

## 2024-05-27 NOTE — Anesthesia Procedure Notes (Addendum)
 Spinal  Patient location during procedure: OR End time: 05/27/2024 10:28 AM Reason for block: surgical anesthesia Staffing Performed: resident/CRNA  Resident/CRNA: Dwana Gong D, CRNA Performed by: Samyiah Halvorsen, Clinical cytogeneticist D, CRNA Authorized by: Erma Thom SAUNDERS, MD   Preanesthetic Checklist Completed: patient identified, IV checked, site marked, risks and benefits discussed, surgical consent, monitors and equipment checked, pre-op evaluation and timeout performed Spinal Block Patient position: sitting Prep: DuraPrep Patient monitoring: heart rate, continuous pulse ox and blood pressure Approach: midline Location: L3-4 Injection technique: single-shot Needle Needle type: Pencan  Needle gauge: 24 G Needle length: 9 cm Assessment Sensory level: T6 Events: CSF return

## 2024-05-27 NOTE — Evaluation (Signed)
 Physical Therapy Evaluation Patient Details Name: Marie Calhoun MRN: [id number] DOB: 1951/04/09 Today's Date: 05/27/2024  History of Present Illness  Pt s/p L THR and with hx of R THR, bil TKR, asthma, Charcot-Marie-Tooth dz,and Sjogrens syndrome.  Clinical Impression  Pt s/p L THR and presents with decreased L LE strength/ROM and post op pain limiting functional mobility.   Pt should progress to dc home with 24/7 assist of friends.        If plan is discharge home, recommend the following: A little help with walking and/or transfers;A little help with bathing/dressing/bathroom;Assistance with cooking/housework;Assist for transportation;Help with stairs or ramp for entrance   Can travel by private vehicle        Equipment Recommendations None recommended by PT  Recommendations for Other Services       Functional Status Assessment Patient has had a recent decline in their functional status and demonstrates the ability to make significant improvements in function in a reasonable and predictable amount of time.     Precautions / Restrictions Precautions Precautions: Fall Recall of Precautions/Restrictions: Intact Restrictions Weight Bearing Restrictions Per Provider Order: No Other Position/Activity Restrictions: WBAT      Mobility  Bed Mobility Overal bed mobility: Needs Assistance Bed Mobility: Supine to Sit     Supine to sit: Min assist     General bed mobility comments: cues for sequence and use of R LE to self assist    Transfers Overall transfer level: Needs assistance Equipment used: Rolling walker (2 wheels) Transfers: Sit to/from Stand Sit to Stand: Min assist           General transfer comment: cues for LE management and use of UEs to self assist    Ambulation/Gait Ambulation/Gait assistance: Min assist Gait Distance (Feet): 13 Feet Assistive device: Rolling walker (2 wheels) Gait Pattern/deviations: Step-to pattern, Decreased step length - right,  Decreased step length - left, Shuffle, Trunk flexed Gait velocity: decr     General Gait Details: cues for sequence, posture and position from RW; distanct ltd by pain  Stairs            Wheelchair Mobility     Tilt Bed    Modified Rankin (Stroke Patients Only)       Balance Overall balance assessment: Needs assistance Sitting-balance support: No upper extremity supported, Feet supported Sitting balance-Leahy Scale: Fair     Standing balance support: Bilateral upper extremity supported Standing balance-Leahy Scale: Poor                               Pertinent Vitals/Pain Pain Assessment Pain Assessment: 0-10 Pain Score: 7  Pain Descriptors / Indicators: Aching, Burning Pain Intervention(s): Limited activity within patient's tolerance, Monitored during session, Premedicated before session, Ice applied    Home Living Family/patient expects to be discharged to:: Private residence Living Arrangements: Alone Available Help at Discharge: Friend(s);Available 24 hours/day;Personal care attendant;Neighbor Type of Home: House Home Access: Stairs to enter   [employer] of Steps: 1   Home Layout: One level Home Equipment: Cane - single point;Other (comment);Rollator (4 wheels);BSC/3in1;Shower seat;Hand held shower head;Shower seat - built Cytogeneticist (2 wheels)      Prior Function Prior Level of Function : Independent/Modified Independent             Mobility Comments: RW for mobility tasks in home, community B LE braces due to Carcot-Marie-Tooth disease and RW ADLs Comments: ind     Extremity/Trunk  Assessment   Upper Extremity Assessment Upper Extremity Assessment: Overall WFL for tasks assessed    Lower Extremity Assessment Lower Extremity Assessment: LLE deficits/detail    Cervical / Trunk Assessment Cervical / Trunk Assessment: Normal  Communication   Communication Communication: No apparent  difficulties Factors Affecting Communication: Hearing impaired    Cognition Arousal: Alert Behavior During Therapy: WFL for tasks assessed/performed   PT - Cognitive impairments: No apparent impairments                         Following commands: Intact       Cueing Cueing Techniques: Verbal cues     General Comments      Exercises Total Joint Exercises Ankle Circles/Pumps: AROM, Both, 15 reps, Supine   Assessment/Plan    PT Assessment Patient needs continued PT services  PT Problem List Decreased strength;Decreased range of motion;Decreased activity tolerance;Decreased balance;Decreased mobility;Decreased knowledge of use of DME;Pain       PT Treatment Interventions DME instruction;Gait training;Stair training;Functional mobility training;Therapeutic activities;Therapeutic exercise;Patient/family education    PT Goals (Current goals can be found in the Care Plan section)  Acute Rehab PT Goals Patient Stated Goal: Regain IND PT Goal Formulation: With patient Time For Goal Achievement: 06/03/24 Potential to Achieve Goals: Good    Frequency 7X/week     Co-evaluation               AM-PAC PT 6 Clicks Mobility  Outcome Measure Help needed turning from your back to your side while in a flat bed without using bedrails?: A Little Help needed moving from lying on your back to sitting on the side of a flat bed without using bedrails?: A Little Help needed moving to and from a bed to a chair (including a wheelchair)?: A Little Help needed standing up from a chair using your arms (e.g., wheelchair or bedside chair)?: A Little Help needed to walk in hospital room?: A Lot Help needed climbing 3-5 steps with a railing? : A Lot 6 Click Score: 16    End of Session Equipment Utilized During Treatment: Gait belt Activity Tolerance: Patient tolerated treatment well Patient left: in chair;with call bell/phone within reach;with chair alarm set Nurse  Communication: Mobility status PT Visit Diagnosis: Difficulty in walking, not elsewhere classified (R26.2)    Time: 8394-8371 PT Time Calculation (min) (ACUTE ONLY): 23 min   Charges:   PT Evaluation $PT Eval Low Complexity: 1 Low PT Treatments $Gait Training: 8-22 mins PT General Charges $$ ACUTE PT VISIT: 1 Visit         [facility] PT Acute Rehabilitation Services Office [phone]   Ms [facility] 05/27/2024, 4:46 PM

## 2024-05-27 NOTE — Op Note (Signed)
 OPERATIVE REPORT  SURGEON: Redell Shoals, MD   ASSISTANT: Valery Potters, PA-C.  PREOPERATIVE DIAGNOSIS: Left hip arthritis.   POSTOPERATIVE DIAGNOSIS: Left hip arthritis.   PROCEDURE: Left total hip arthroplasty, anterior approach.   IMPLANTS: Biomet Taperloc Complete Microplasty stem, size 18 x 121 mm, high offset. Biomet G7 OsseoTi Cup, size 54 mm. Biomet Vivacit-E liner, size 36 mm, F, neutral. Biomet Biolox ceramic head ball, size 36 - 6 mm.  ANESTHESIA:  MAC and Spinal  ESTIMATED BLOOD LOSS:-400 mL    ANTIBIOTICS: 2g Ancef .  DRAINS: None.  COMPLICATIONS: None.   CONDITION: PACU - hemodynamically stable.   BRIEF CLINICAL NOTE: Marie Calhoun is a 73 y.o. female with a long-standing history of Left hip arthritis. After failing conservative management, the patient was indicated for total hip arthroplasty. The risks, benefits, and alternatives to the procedure were explained, and the patient elected to proceed.  PROCEDURE IN DETAIL: Surgical site was marked by myself in the pre-op holding area. Once inside the operating room, spinal anesthesia was obtained, and a foley catheter was inserted. The patient was then positioned on the Hana table.  All bony prominences were well padded.  The hip was prepped and draped in the normal sterile surgical fashion.  A time-out was called verifying side and site of surgery. The patient received IV antibiotics within 60 minutes of beginning the procedure.   Bikini incision was made, and superficial dissection was performed lateral to the ASIS. The direct anterior approach to the hip was performed through the Hueter interval.  Lateral femoral circumflex vessels were treated with the Auqumantys. The anterior capsule was exposed and an inverted T capsulotomy was made. The femoral neck cut was made to the level of the templated cut.  A corkscrew was placed into the head and the head was removed.  The femoral head was found to have eburnated bone.  The head was passed to the back table and was measured. Pubofemoral ligament was released off of the calcar, taking care to stay on bone. Superior capsule was released from the greater trochanter, taking care to stay lateral to the posterior border of the femoral neck in order to preserve the short external rotators.   Acetabular exposure was achieved, and the pulvinar and labrum were excised. Sequential reaming of the acetabulum was then performed up to a size 53 mm reamer. A 54 mm cup was then opened and impacted into place at approximately 40 degrees of abduction and 20 degrees of anteversion. The final polyethylene liner was impacted into place and acetabular osteophytes were removed.    I then gained femoral exposure taking care to protect the abductors and greater trochanter.  This was performed using standard external rotation, extension, and adduction.  A cookie cutter was used to enter the femoral canal, and then the femoral canal finder was placed.  Sequential broaching was performed up to a size 18.  Calcar planer was used on the femoral neck remnant.  I placed a high offset neck and a trial head ball.  The hip was reduced.  Leg lengths and offset were checked fluoroscopically.  The hip was dislocated and trial components were removed.  The final implants were placed, and the hip was reduced.  Fluoroscopy was used to confirm component position and leg lengths.  At 90 degrees of external rotation and full extension, the hip was stable to an anterior directed force.   The wound was copiously irrigated with Prontosan solution and normal saline using pulse lavage.  Marcaine   solution was injected into the periarticular soft tissue.  The wound was closed in layers using #1 Vicryl and V-Loc for the fascia, 2-0 Vicryl for the subcutaneous fat, 2-0 Monocryl for the deep dermal layer, 3-0 running Monocryl subcuticular stitch, and Dermabond for the skin.  Once the glue was fully dried, an Aquacell Ag dressing  was applied.  The patient was transported to the recovery room in stable condition.  Sponge, needle, and instrument counts were correct at the end of the case x2.  The patient tolerated the procedure well and there were no known complications.  Please note that a surgical assistant was a medical necessity for this procedure to perform it in a safe and expeditious manner. Assistant was necessary to provide appropriate retraction of vital neurovascular structures, to prevent femoral fracture, and to allow for anatomic placement of the prosthesis.

## 2024-05-28 ENCOUNTER — Other Ambulatory Visit (HOSPITAL_COMMUNITY): Payer: Self-pay

## 2024-05-28 ENCOUNTER — Encounter (HOSPITAL_COMMUNITY): Payer: Self-pay | Admitting: Orthopedic Surgery

## 2024-05-28 DIAGNOSIS — M1612 Unilateral primary osteoarthritis, left hip: Secondary | ICD-10-CM | POA: Diagnosis not present

## 2024-05-28 LAB — BASIC METABOLIC PANEL WITH GFR
Anion gap: 10 (ref 5–15)
BUN: 13 mg/dL (ref 8–23)
CO2: 24 mmol/L (ref 22–32)
Calcium: 8.1 mg/dL — ABNORMAL LOW (ref 8.9–10.3)
Chloride: 99 mmol/L (ref 98–111)
Creatinine, Ser: 1.12 mg/dL — ABNORMAL HIGH (ref 0.44–1.00)
GFR, Estimated: 53 mL/min — ABNORMAL LOW (ref >60)
Glucose, Bld: 131 mg/dL — ABNORMAL HIGH (ref 70–99)
Potassium: 3.7 mmol/L (ref 3.5–5.1)
Sodium: 133 mmol/L — ABNORMAL LOW (ref 135–145)

## 2024-05-28 LAB — CBC
HCT: 31.2 % — ABNORMAL LOW (ref 36.0–46.0)
Hemoglobin: 10.2 g/dL — ABNORMAL LOW (ref 12.0–15.0)
MCH: 33.7 pg (ref 26.0–34.0)
MCHC: 32.7 g/dL (ref 30.0–36.0)
MCV: 103 fL — ABNORMAL HIGH (ref 80.0–100.0)
Platelets: 219 K/uL (ref 150–400)
RBC: 3.03 MIL/uL — ABNORMAL LOW (ref 3.87–5.11)
RDW: 12.8 % (ref 11.5–15.5)
WBC: 10.4 K/uL (ref 4.0–10.5)
nRBC: 0 % (ref 0.0–0.2)

## 2024-05-28 MED ORDER — ASPIRIN 81 MG PO CHEW
81.0000 mg | CHEWABLE_TABLET | Freq: Two times a day (BID) | ORAL | 0 refills | Status: AC
  Filled 2024-05-28: qty 90, 45d supply, fill #0

## 2024-05-28 MED ORDER — DOCUSATE SODIUM 100 MG PO CAPS
100.0000 mg | ORAL_CAPSULE | Freq: Two times a day (BID) | ORAL | 0 refills | Status: DC
  Filled 2024-05-28: qty 60, 30d supply, fill #0

## 2024-05-28 MED ORDER — SENNA 8.6 MG PO TABS
2.0000 | ORAL_TABLET | Freq: Every day | ORAL | 0 refills | Status: AC
  Filled 2024-05-28: qty 30, 15d supply, fill #0

## 2024-05-28 MED ORDER — OXYCODONE HCL 5 MG PO TABS
5.0000 mg | ORAL_TABLET | ORAL | 0 refills | Status: DC | PRN
  Filled 2024-05-28: qty 40, 7d supply, fill #0

## 2024-05-28 MED ORDER — ONDANSETRON HCL 4 MG PO TABS
4.0000 mg | ORAL_TABLET | Freq: Three times a day (TID) | ORAL | 0 refills | Status: AC | PRN
  Filled 2024-05-28: qty 30, 10d supply, fill #0

## 2024-05-28 MED ORDER — METHOCARBAMOL 500 MG PO TABS
500.0000 mg | ORAL_TABLET | Freq: Four times a day (QID) | ORAL | 0 refills | Status: DC | PRN
  Filled 2024-05-28: qty 20, 5d supply, fill #0

## 2024-05-28 NOTE — Progress Notes (Signed)
 Physical Therapy Treatment Patient Details Name: Marie Calhoun MRN: [id number] DOB: December 09, 1950 Today's Date: 05/28/2024   History of Present Illness Pt s/p L THR and with hx of R THR, bil TKR, asthma, Charcot-Marie-Tooth dz,and Sjogrens syndrome.    PT Comments  Pt very cooperative and progressing with mobility but continues limited by elevated pain level and fatigues easily with increased anxiety level.  HEP initiated, pt with noted improvement in bed mobility at sup level, and up to ambulate limited distance into hall with noted distance ltd by pain and with noted intermittent mild buckling at L LE.    If plan is discharge home, recommend the following: A little help with walking and/or transfers;A little help with bathing/dressing/bathroom;Assistance with cooking/housework;Assist for transportation;Help with stairs or ramp for entrance   Can travel by private vehicle        Equipment Recommendations  None recommended by PT    Recommendations for Other Services       Precautions / Restrictions Precautions Precautions: Fall Recall of Precautions/Restrictions: Intact Restrictions Weight Bearing Restrictions Per Provider Order: No Other Position/Activity Restrictions: WBAT     Mobility  Bed Mobility Overal bed mobility: Needs Assistance Bed Mobility: Supine to Sit     Supine to sit: Supervision     General bed mobility comments: Increased time with self cues    Transfers Overall transfer level: Needs assistance Equipment used: Rolling walker (2 wheels) Transfers: Sit to/from Stand Sit to Stand: Min assist           General transfer comment: cues for LE management and use of UEs to self assist    Ambulation/Gait Ambulation/Gait assistance: Min assist Gait Distance (Feet): 28 Feet Assistive device: Rolling walker (2 wheels) Gait Pattern/deviations: Step-to pattern, Decreased step length - right, Decreased step length - left, Shuffle, Trunk flexed Gait  velocity: decr     General Gait Details: cues for sequence, posture and position from RW; distance ltd by pain/fatigue.  Pt with noted intermittent buckling L LE   Stairs             Wheelchair Mobility     Tilt Bed    Modified Rankin (Stroke Patients Only)       Balance Overall balance assessment: Needs assistance Sitting-balance support: No upper extremity supported, Feet supported Sitting balance-Leahy Scale: Good     Standing balance support: Bilateral upper extremity supported Standing balance-Leahy Scale: Poor                              Communication Communication Communication: No apparent difficulties Factors Affecting Communication: Hearing impaired  Cognition Arousal: Alert Behavior During Therapy: WFL for tasks assessed/performed   PT - Cognitive impairments: No apparent impairments                         Following commands: Intact      Cueing Cueing Techniques: Verbal cues  Exercises Total Joint Exercises Ankle Circles/Pumps: AROM, Both, 15 reps, Supine Quad Sets: AROM, Both, 10 reps, Supine Heel Slides: AAROM, Left, 20 reps, Supine Hip ABduction/ADduction: AAROM, Left, 15 reps, Supine    General Comments        Pertinent Vitals/Pain Pain Assessment Pain Assessment: 0-10 Pain Score: 8  Pain Descriptors / Indicators: Aching, Burning Pain Intervention(s): Limited activity within patient's tolerance, Monitored during session, Premedicated before session, Ice applied    Home Living  Prior Function            PT Goals (current goals can now be found in the care plan section) Acute Rehab PT Goals Patient Stated Goal: Regain IND PT Goal Formulation: With patient Time For Goal Achievement: 06/03/24 Potential to Achieve Goals: Good Progress towards PT goals: Progressing toward goals    Frequency    7X/week      PT Plan      Co-evaluation              AM-PAC  PT 6 Clicks Mobility   Outcome Measure  Help needed turning from your back to your side while in a flat bed without using bedrails?: A Little Help needed moving from lying on your back to sitting on the side of a flat bed without using bedrails?: A Little Help needed moving to and from a bed to a chair (including a wheelchair)?: A Little Help needed standing up from a chair using your arms (e.g., wheelchair or bedside chair)?: A Little Help needed to walk in hospital room?: A Little Help needed climbing 3-5 steps with a railing? : A Lot 6 Click Score: 17    End of Session Equipment Utilized During Treatment: Gait belt Activity Tolerance: Patient limited by fatigue;Patient limited by pain Patient left: in chair;with call bell/phone within reach;with chair alarm set Nurse Communication: Mobility status PT Visit Diagnosis: Difficulty in walking, not elsewhere classified (R26.2)     Time: 8882-8844 PT Time Calculation (min) (ACUTE ONLY): 38 min  Charges:    $Gait Training: 8-22 mins $Therapeutic Exercise: 8-22 mins PT General Charges $$ ACUTE PT VISIT: 1 Visit                     [facility] PT Acute Rehabilitation Services Office [phone]    Yue Glasheen 05/28/2024, 1:22 PM

## 2024-05-28 NOTE — Plan of Care (Signed)
  Problem: Education: Goal: Knowledge of General Education information will improve Description: Including pain rating scale, medication(s)/side effects and non-pharmacologic comfort measures Outcome: Progressing   Problem: Health Behavior/Discharge Planning: Goal: Ability to manage health-related needs will improve Outcome: Progressing   Problem: Clinical Measurements: Goal: Ability to maintain clinical measurements within normal limits will improve Outcome: Progressing   Problem: Activity: Goal: Risk for activity intolerance will decrease Outcome: Progressing   Problem: Coping: Goal: Level of anxiety will decrease Outcome: Progressing   Problem: Pain Managment: Goal: General experience of comfort will improve and/or be controlled Outcome: Progressing

## 2024-05-28 NOTE — Progress Notes (Signed)
 Physical Therapy Treatment Patient Details Name: Marie Calhoun MRN: [id number] DOB: 22-Dec-1950 Today's Date: 05/28/2024   History of Present Illness Pt s/p L THR and with hx of R THR, bil TKR, asthma, Charcot-Marie-Tooth dz,and Sjogrens syndrome.    PT Comments  Pt continues very cooperative and progressing with mobility including increased activity tolerance, improving stability with gait and decreasing assist level for most tasks.  Pt hopeful for dc home tomorrow.    If plan is discharge home, recommend the following: A little help with walking and/or transfers;A little help with bathing/dressing/bathroom;Assistance with cooking/housework;Assist for transportation;Help with stairs or ramp for entrance   Can travel by private vehicle        Equipment Recommendations  None recommended by PT    Recommendations for Other Services       Precautions / Restrictions Precautions Precautions: Fall Recall of Precautions/Restrictions: Intact Restrictions Weight Bearing Restrictions Per Provider Order: No Other Position/Activity Restrictions: WBAT     Mobility  Bed Mobility Overal bed mobility: Needs Assistance Bed Mobility: Sit to Supine     Supine to sit: Supervision Sit to supine: Supervision   General bed mobility comments: Increased time with self cues and use of gait belt to self assist    Transfers Overall transfer level: Needs assistance Equipment used: Rolling walker (2 wheels) Transfers: Sit to/from Stand Sit to Stand: Contact guard assist, Supervision           General transfer comment: cues for LE management and use of UEs to self assist    Ambulation/Gait Ambulation/Gait assistance: Contact guard assist Gait Distance (Feet): 75 Feet Assistive device: Rolling walker (2 wheels) Gait Pattern/deviations: Step-to pattern, Decreased step length - right, Decreased step length - left, Shuffle, Trunk flexed Gait velocity: decr     General Gait Details: cues for  sequence, posture and position from RW; distance ltd by pain/fatigue.  Pt with noted intermittent buckling L LE   Stairs             Wheelchair Mobility     Tilt Bed    Modified Rankin (Stroke Patients Only)       Balance Overall balance assessment: Needs assistance Sitting-balance support: No upper extremity supported, Feet supported Sitting balance-Leahy Scale: Good     Standing balance support: Single extremity supported Standing balance-Leahy Scale: Poor                              Communication Communication Communication: No apparent difficulties Factors Affecting Communication: Hearing impaired  Cognition Arousal: Alert Behavior During Therapy: WFL for tasks assessed/performed   PT - Cognitive impairments: No apparent impairments                         Following commands: Intact      Cueing Cueing Techniques: Verbal cues  Exercises Total Joint Exercises Ankle Circles/Pumps: AROM, Both, 15 reps, Supine Quad Sets: AROM, Both, 10 reps, Supine Heel Slides: AAROM, Left, 20 reps, Supine Hip ABduction/ADduction: AAROM, Left, 15 reps, Supine    General Comments        Pertinent Vitals/Pain Pain Assessment Pain Assessment: 0-10 Pain Score: 6  Pain Descriptors / Indicators: Aching, Burning Pain Intervention(s): Limited activity within patient's tolerance, Monitored during session, Premedicated before session, Ice applied    Home Living  Prior Function            PT Goals (current goals can now be found in the care plan section) Acute Rehab PT Goals Patient Stated Goal: Regain IND PT Goal Formulation: With patient Time For Goal Achievement: 06/03/24 Potential to Achieve Goals: Good Progress towards PT goals: Progressing toward goals    Frequency    7X/week      PT Plan      Co-evaluation              AM-PAC PT 6 Clicks Mobility   Outcome Measure  Help needed  turning from your back to your side while in a flat bed without using bedrails?: A Little Help needed moving from lying on your back to sitting on the side of a flat bed without using bedrails?: A Little Help needed moving to and from a bed to a chair (including a wheelchair)?: A Little Help needed standing up from a chair using your arms (e.g., wheelchair or bedside chair)?: A Little Help needed to walk in hospital room?: A Little Help needed climbing 3-5 steps with a railing? : A Lot 6 Click Score: 17    End of Session Equipment Utilized During Treatment: Gait belt Activity Tolerance: Patient tolerated treatment well;Patient limited by fatigue;Patient limited by pain Patient left: in bed;with call bell/phone within reach;with bed alarm set Nurse Communication: Mobility status PT Visit Diagnosis: Difficulty in walking, not elsewhere classified (R26.2)     Time: 8579-8548 PT Time Calculation (min) (ACUTE ONLY): 31 min  Charges:    $Gait Training: 23-37 mins $Therapeutic Exercise: 8-22 mins PT General Charges $$ ACUTE PT VISIT: 1 Visit                     [facility] PT Acute Rehabilitation Services Office [phone]    [facility] 05/28/2024, 4:43 PM

## 2024-05-28 NOTE — TOC Transition Note (Addendum)
 Transition of Care [facility]: [facility]) - Discharge Note   Patient Details  Name: Marie Calhoun MRN: [id number] Date of Birth: [birth year]  Transition of [facility]) CM/SW Contact:  Alfonse JONELLE Rex, RN Phone Number: 05/28/2024, 10:50 AM   Clinical Narrative:   Met with patient at bedside to review dc therapy and home equipment needs, pt confirmed HEP , reports has RW, toilet seat and shower chair. No TOC needs.   -4:08pm Changed from Outpatient in Bed to OBS. MOON completed.     Final next level of care: Home/Self Care     Patient Goals and CMS Choice Patient states their goals for this hospitalization and ongoing recovery are:: return home          Discharge Placement                       Discharge Plan and Services Additional resources added to the After Visit Summary for                                       Social Drivers of Health (SDOH) Interventions SDOH Screenings   Food Insecurity: No Food Insecurity (05/27/2024)  Housing: Low Risk  (05/27/2024)  Transportation Needs: No Transportation Needs (05/27/2024)  Utilities: Not At Risk (05/27/2024)  Social Connections: Moderately Integrated (05/27/2024)  Tobacco Use: Low Risk  (05/27/2024)     Readmission Risk Interventions     No data to display

## 2024-05-28 NOTE — Care Management Obs Status (Signed)
 MEDICARE OBSERVATION STATUS NOTIFICATION   Patient Details  Name: Marie Calhoun MRN: [id number] Date of Birth: 1951/02/19   Medicare Observation Status Notification Given:       Alfonse JONELLE Rex, RN 05/28/2024, 4:08 PM

## 2024-05-28 NOTE — Progress Notes (Signed)
    Subjective:  Patient reports pain as mild to moderate.  Denies N/V/CP/SOB/Abd pain. She reports she is doing well this morning. She reports she has been mobilizing slowly with PT.  Catheter removed this am.    Objective:   VITALS:   Vitals:   05/27/24 2041 05/28/24 0117 05/28/24 0600 05/28/24 0827  BP: 127/80 116/80 133/86 129/80  Pulse: 66 63 63 66  Resp: 14 16 17 17   Temp: 97.8 F (36.6 C) 98 F (36.7 C) 98.6 F (37 C) 98.3 F (36.8 C)  TempSrc: Oral Oral Oral Oral  SpO2: 100% 95% 98% 99%  Weight:      Height:        NAD Neurologically intact ABD soft Neurovascular intact Sensation intact distally Intact pulses distally Dorsiflexion/Plantar flexion intact Incision: dressing C/D/I No cellulitis present Compartment soft   Lab Results  Component Value Date   WBC 10.4 05/28/2024   HGB 10.2 (L) 05/28/2024   HCT 31.2 (L) 05/28/2024   MCV 103.0 (H) 05/28/2024   PLT 219 05/28/2024   BMET    Component Value Date/Time   NA 133 (L) 05/28/2024 0332   K 3.7 05/28/2024 0332   CL 99 05/28/2024 0332   CO2 24 05/28/2024 0332   GLUCOSE 131 (H) 05/28/2024 0332   BUN 13 05/28/2024 0332   CREATININE 1.12 (H) 05/28/2024 0332   CALCIUM  8.1 (L) 05/28/2024 0332   GFRNONAA 53 (L) 05/28/2024 0332     Assessment/Plan: 1 Day Post-Op   Principal Problem:   S/P total left hip arthroplasty  ABLA. Hemoglobin 10.2, asymptomatic. Continue to monitor.  Creatinine 1.12 (0.79 prior to admission). Continue to monitor. Continue PO fluid intake.   WBAT with walker DVT ppx: Aspirin , SCDs, TEDS PO pain control PT/OT: She ambulated 13 feet with PT yesterday. Continue PT today.  Dispo:  - D/c home with HEP today vs tomorrow pending progress with PT.    Valery GORMAN Potters 05/28/2024, 9:20 AM   [facility]  [place] [address], [address], [place], [place] [address] Phone: [phone] [email or web address] Facebook  [employer]

## 2024-05-28 NOTE — Plan of Care (Signed)
  Problem: Education: Goal: Knowledge of General Education information will improve Description: Including pain rating scale, medication(s)/side effects and non-pharmacologic comfort measures Outcome: Progressing   Problem: Health Behavior/Discharge Planning: Goal: Ability to manage health-related needs will improve Outcome: Progressing   Problem: Clinical Measurements: Goal: Ability to maintain clinical measurements within normal limits will improve Outcome: Progressing Goal: Will remain free from infection Outcome: Progressing Goal: Diagnostic test results will improve Outcome: Progressing Goal: Respiratory complications will improve Outcome: Progressing Goal: Cardiovascular complication will be avoided Outcome: Progressing   Problem: Activity: Goal: Risk for activity intolerance will decrease Outcome: Progressing   Problem: Coping: Goal: Level of anxiety will decrease Outcome: Progressing   Problem: Elimination: Goal: Will not experience complications related to bowel motility Outcome: Progressing Goal: Will not experience complications related to urinary retention Outcome: Progressing   Problem: Pain Managment: Goal: General experience of comfort will improve and/or be controlled Outcome: Progressing   Problem: Safety: Goal: Ability to remain free from injury will improve Outcome: Progressing   Problem: Skin Integrity: Goal: Risk for impaired skin integrity will decrease Outcome: Progressing   Problem: Education: Goal: Knowledge of the prescribed therapeutic regimen will improve Outcome: Progressing Goal: Understanding of discharge needs will improve Outcome: Progressing   Problem: Activity: Goal: Ability to avoid complications of mobility impairment will improve Outcome: Progressing Goal: Ability to tolerate increased activity will improve Outcome: Progressing   Problem: Clinical Measurements: Goal: Postoperative complications will be avoided or  minimized Outcome: Progressing   Problem: Pain Management: Goal: Pain level will decrease with appropriate interventions Outcome: Progressing   Problem: Skin Integrity: Goal: Will show signs of wound healing Outcome: Progressing

## 2024-05-29 DIAGNOSIS — M1612 Unilateral primary osteoarthritis, left hip: Secondary | ICD-10-CM | POA: Diagnosis not present

## 2024-05-29 LAB — BASIC METABOLIC PANEL WITH GFR
Anion gap: 9 (ref 5–15)
BUN: 16 mg/dL (ref 8–23)
CO2: 24 mmol/L (ref 22–32)
Calcium: 8.4 mg/dL — ABNORMAL LOW (ref 8.9–10.3)
Chloride: 100 mmol/L (ref 98–111)
Creatinine, Ser: 1.11 mg/dL — ABNORMAL HIGH (ref 0.44–1.00)
GFR, Estimated: 53 mL/min — ABNORMAL LOW (ref >60)
Glucose, Bld: 92 mg/dL (ref 70–99)
Potassium: 3.4 mmol/L — ABNORMAL LOW (ref 3.5–5.1)
Sodium: 133 mmol/L — ABNORMAL LOW (ref 135–145)

## 2024-05-29 LAB — CBC
HCT: 31.1 % — ABNORMAL LOW (ref 36.0–46.0)
Hemoglobin: 10.2 g/dL — ABNORMAL LOW (ref 12.0–15.0)
MCH: 34 pg (ref 26.0–34.0)
MCHC: 32.8 g/dL (ref 30.0–36.0)
MCV: 103.7 fL — ABNORMAL HIGH (ref 80.0–100.0)
Platelets: 203 K/uL (ref 150–400)
RBC: 3 MIL/uL — ABNORMAL LOW (ref 3.87–5.11)
RDW: 12.9 % (ref 11.5–15.5)
WBC: 7.6 K/uL (ref 4.0–10.5)
nRBC: 0 % (ref 0.0–0.2)

## 2024-05-29 LAB — GLUCOSE, CAPILLARY
Glucose-Capillary: 74 mg/dL (ref 70–99)
Glucose-Capillary: 93 mg/dL (ref 70–99)
Glucose-Capillary: 82 mg/dL (ref 70–99)
Glucose-Capillary: 282 mg/dL — ABNORMAL HIGH (ref 70–99)

## 2024-05-29 NOTE — Progress Notes (Signed)
 Physical Therapy Treatment Patient Details Name: Marie Calhoun MRN: [id number] DOB: 13-Dec-1950 Today's Date: 05/29/2024   History of Present Illness Pt s/p L THR and with hx of R THR, bil TKR, asthma, Charcot-Marie-Tooth dz,and Sjogrens syndrome.    PT Comments  Pt with increased anxiety after hypoglycemic incident this am but states wants to try to move.  Pt assisted from bed but tolerated limited distance 2* fatigue/pain (had received pain meds prior to) and returned to bedside and assisted to supine.  Pt expressing frustration and hopeful for improvement tomorrow.   If plan is discharge home, recommend the following: A little help with walking and/or transfers;A little help with bathing/dressing/bathroom;Assistance with cooking/housework;Assist for transportation;Help with stairs or ramp for entrance   Can travel by private vehicle        Equipment Recommendations  None recommended by PT    Recommendations for Other Services       Precautions / Restrictions Precautions Precautions: Fall Recall of Precautions/Restrictions: Intact Restrictions Weight Bearing Restrictions Per Provider Order: No Other Position/Activity Restrictions: WBAT     Mobility  Bed Mobility Overal bed mobility: Needs Assistance Bed Mobility: Supine to Sit     Supine to sit: Supervision Sit to supine: Supervision   General bed mobility comments: cues for sequence with min assist to manage L LE    Transfers Overall transfer level: Needs assistance Equipment used: Rolling walker (2 wheels) Transfers: Sit to/from Stand Sit to Stand: Contact guard assist           General transfer comment: min cues for LE management and use of UEs to self assist    Ambulation/Gait Ambulation/Gait assistance: Min assist Gait Distance (Feet): 28 Feet Assistive device: Rolling walker (2 wheels) Gait Pattern/deviations: Step-to pattern, Decreased step length - right, Decreased step length - left, Shuffle, Trunk  flexed Gait velocity: decr     General Gait Details: cues for sequence, posture and position from RW; distance ltd by pain/fatigue.  Pt with noted intermittent buckling L LE   Stairs Stairs:  (attempted but not completed)           Wheelchair Mobility     Tilt Bed    Modified Rankin (Stroke Patients Only)       Balance Overall balance assessment: Needs assistance Sitting-balance support: No upper extremity supported, Feet supported Sitting balance-Leahy Scale: Good     Standing balance support: Single extremity supported Standing balance-Leahy Scale: Poor                              Communication Communication Communication: No apparent difficulties Factors Affecting Communication: Hearing impaired  Cognition Arousal: Alert Behavior During Therapy: WFL for tasks assessed/performed, Anxious   PT - Cognitive impairments: No apparent impairments                         Following commands: Intact      Cueing Cueing Techniques: Verbal cues  Exercises Total Joint Exercises Ankle Circles/Pumps: AROM, Both, 15 reps, Supine Quad Sets: AROM, Both, 10 reps, Supine Heel Slides: AAROM, Left, 20 reps, Supine Hip ABduction/ADduction: AAROM, Left, 15 reps, Supine Long Arc Quad: AAROM, Left, 15 reps, Seated    General Comments        Pertinent Vitals/Pain Pain Assessment Pain Assessment: 0-10 Pain Score: 8  Pain Location: L hip and R UE Pain Descriptors / Indicators: Aching, Burning Pain Intervention(s): Limited activity within patient's tolerance, Monitored  during session, Premedicated before session, Ice applied    Home Living                          Prior Function            PT Goals (current goals can now be found in the care plan section) Acute Rehab PT Goals Patient Stated Goal: Regain IND PT Goal Formulation: With patient Time For Goal Achievement: 06/03/24 Potential to Achieve Goals: Good Progress towards PT  goals: Progressing toward goals    Frequency    7X/week      PT Plan      Co-evaluation              AM-PAC PT 6 Clicks Mobility   Outcome Measure  Help needed turning from your back to your side while in a flat bed without using bedrails?: A Little Help needed moving from lying on your back to sitting on the side of a flat bed without using bedrails?: A Little Help needed moving to and from a bed to a chair (including a wheelchair)?: A Little Help needed standing up from a chair using your arms (e.g., wheelchair or bedside chair)?: A Little Help needed to walk in hospital room?: A Little Help needed climbing 3-5 steps with a railing? : A Lot 6 Click Score: 17    End of Session Equipment Utilized During Treatment: Gait belt Activity Tolerance: Patient limited by fatigue;Patient limited by pain Patient left: with call bell/phone within reach;in bed;with bed alarm set Nurse Communication: Mobility status PT Visit Diagnosis: Difficulty in walking, not elsewhere classified (R26.2)     Time: 8651-8588 PT Time Calculation (min) (ACUTE ONLY): 23 min  Charges:    $Gait Training: 8-22 mins $Therapeutic Exercise: 8-22 mins PT General Charges $$ ACUTE PT VISIT: 1 Visit                     [facility] PT Acute Rehabilitation Services Office [phone]    Marie Calhoun 05/29/2024, 2:22 PM

## 2024-05-29 NOTE — Progress Notes (Signed)
 TOC meds in a secure bag picked up by this RN and delivered to inpatient pharmacy.

## 2024-05-29 NOTE — Progress Notes (Signed)
    Subjective:  Patient reports pain as improved.  Denies N/V/CP/SOB/Abd pain. She reports she is doing well this morning. She reports she has been mobilizing slowly with PT.    Objective:   VITALS:   Vitals:   05/28/24 0600 05/28/24 0827 05/28/24 2109 05/29/24 0543  BP: 133/86 129/80 (!) 143/71 134/68  Pulse: 63 66 75 86  Resp: 17 17 15 15   Temp: 98.6 F (37 C) 98.3 F (36.8 C) 98.1 F (36.7 C) 99.6 F (37.6 C)  TempSrc: Oral Oral Oral Oral  SpO2: 98% 99% 96% 95%  Weight:      Height:        NAD Neurologically intact ABD soft Neurovascular intact Sensation intact distally Intact pulses distally Dorsiflexion/Plantar flexion intact Incision: dressing C/D/I No cellulitis present Compartment soft   Lab Results  Component Value Date   WBC 7.6 05/29/2024   HGB 10.2 (L) 05/29/2024   HCT 31.1 (L) 05/29/2024   MCV 103.7 (H) 05/29/2024   PLT 203 05/29/2024   BMET    Component Value Date/Time   NA 133 (L) 05/29/2024 0350   K 3.4 (L) 05/29/2024 0350   CL 100 05/29/2024 0350   CO2 24 05/29/2024 0350   GLUCOSE 92 05/29/2024 0350   BUN 16 05/29/2024 0350   CREATININE 1.11 (H) 05/29/2024 0350   CALCIUM  8.4 (L) 05/29/2024 0350   GFRNONAA 53 (L) 05/29/2024 0350     Assessment/Plan: 2 Days Post-Op   Principal Problem:   S/P total left hip arthroplasty  ABLA. Hemoglobin stable at 10.2, asymptomatic. Continue to monitor.  Creatinine 1.11 (0.79 prior to admission). Continue to monitor. Continue PO fluid intake.   WBAT with walker DVT ppx: Aspirin , SCDs, TEDS PO pain control PT/OT: She ambulated with PT yesterday. Continue PT today.  Dispo:  - D/c home with HEP pending progress with PT.    Lillia Mountain 05/29/2024, 8:02 AM   [facility]  [place] [address], [place], [place] [address] Phone: [phone] [email or web address] Facebook  [employer]

## 2024-05-29 NOTE — Plan of Care (Signed)
 Alert and oriented.  OOB to chair with staff assistance.  Ambulatory in room with PT.  Hypoglycemic event noted, page sent to covering attending provider and order received for POCT CBG AC+HS.  Medicated for pain and muscle spasms, see MAR for details.   Problem: Education: Goal: Knowledge of General Education information will improve Description: Including pain rating scale, medication(s)/side effects and non-pharmacologic comfort measures Outcome: Progressing   Problem: Health Behavior/Discharge Planning: Goal: Ability to manage health-related needs will improve Outcome: Progressing   Problem: Clinical Measurements: Goal: Ability to maintain clinical measurements within normal limits will improve Outcome: Progressing Goal: Will remain free from infection Outcome: Progressing Goal: Diagnostic test results will improve Outcome: Progressing Goal: Respiratory complications will improve Outcome: Progressing Goal: Cardiovascular complication will be avoided Outcome: Progressing   Problem: Activity: Goal: Risk for activity intolerance will decrease Outcome: Progressing   Problem: Coping: Goal: Level of anxiety will decrease Outcome: Progressing   Problem: Elimination: Goal: Will not experience complications related to bowel motility Outcome: Progressing

## 2024-05-29 NOTE — Progress Notes (Signed)
 Physical Therapy Treatment Patient Details Name: Marie Calhoun MRN: [id number] DOB: May 13, 1951 Today's Date: 05/29/2024   History of Present Illness Pt s/p L THR and with hx of R THR, bil TKR, asthma, Charcot-Marie-Tooth dz,and Sjogrens syndrome.    PT Comments  Pt continues very cooperative but limited this am by ongoing nausea, increased pain and noted intermittent buckling at L LE with ambulation.  Pt performed HEP with written program provided and reviewed.  Pt up to ambulate but distance ltd by nausea and pain increase.  Stair attempted but not completed for safety.  Will follow up in pm.    If plan is discharge home, recommend the following: A little help with walking and/or transfers;A little help with bathing/dressing/bathroom;Assistance with cooking/housework;Assist for transportation;Help with stairs or ramp for entrance   Can travel by private vehicle        Equipment Recommendations  None recommended by PT    Recommendations for Other Services       Precautions / Restrictions Precautions Precautions: Fall Recall of Precautions/Restrictions: Intact Restrictions Weight Bearing Restrictions Per Provider Order: No Other Position/Activity Restrictions: WBAT     Mobility  Bed Mobility Overal bed mobility: Needs Assistance Bed Mobility: Supine to Sit     Supine to sit: Supervision     General bed mobility comments: Increased time with self cues and use of gait belt to self assist    Transfers Overall transfer level: Needs assistance Equipment used: Rolling walker (2 wheels) Transfers: Sit to/from Stand Sit to Stand: Contact guard assist, Supervision           General transfer comment: min cues for LE management and use of UEs to self assist    Ambulation/Gait Ambulation/Gait assistance: Min assist, Contact guard assist Gait Distance (Feet): 18 Feet Assistive device: Rolling walker (2 wheels) Gait Pattern/deviations: Step-to pattern, Decreased step  length - right, Decreased step length - left, Shuffle, Trunk flexed Gait velocity: decr     General Gait Details: cues for sequence, posture and position from RW; distance ltd by pain/fatigue.  Pt with noted intermittent buckling L LE   Stairs Stairs:  (attempted but not completed)           Wheelchair Mobility     Tilt Bed    Modified Rankin (Stroke Patients Only)       Balance Overall balance assessment: Needs assistance Sitting-balance support: No upper extremity supported, Feet supported Sitting balance-Leahy Scale: Good     Standing balance support: Single extremity supported Standing balance-Leahy Scale: Poor                              Communication Communication Communication: No apparent difficulties Factors Affecting Communication: Hearing impaired  Cognition Arousal: Alert Behavior During Therapy: WFL for tasks assessed/performed   PT - Cognitive impairments: No apparent impairments                         Following commands: Intact      Cueing Cueing Techniques: Verbal cues  Exercises Total Joint Exercises Ankle Circles/Pumps: AROM, Both, 15 reps, Supine Quad Sets: AROM, Both, 10 reps, Supine Heel Slides: AAROM, Left, 20 reps, Supine Hip ABduction/ADduction: AAROM, Left, 15 reps, Supine Long Arc Quad: AAROM, Left, 15 reps, Seated    General Comments        Pertinent Vitals/Pain Pain Assessment Pain Assessment: 0-10 Pain Score: 6  Pain Location: L hip Pain Descriptors /  Indicators: Aching, Burning Pain Intervention(s): Limited activity within patient's tolerance, Monitored during session, Premedicated before session, Ice applied    Home Living                          Prior Function            PT Goals (current goals can now be found in the care plan section) Acute Rehab PT Goals Patient Stated Goal: Regain IND PT Goal Formulation: With patient Time For Goal Achievement: 06/03/24 Potential to  Achieve Goals: Good Progress towards PT goals: Progressing toward goals    Frequency    7X/week      PT Plan      Co-evaluation              AM-PAC PT 6 Clicks Mobility   Outcome Measure  Help needed turning from your back to your side while in a flat bed without using bedrails?: A Little Help needed moving from lying on your back to sitting on the side of a flat bed without using bedrails?: A Little Help needed moving to and from a bed to a chair (including a wheelchair)?: A Little Help needed standing up from a chair using your arms (e.g., wheelchair or bedside chair)?: A Little Help needed to walk in hospital room?: A Little Help needed climbing 3-5 steps with a railing? : A Lot 6 Click Score: 17    End of Session Equipment Utilized During Treatment: Gait belt Activity Tolerance: Patient limited by fatigue;Patient limited by pain Patient left: in chair;with call bell/phone within reach Nurse Communication: Mobility status PT Visit Diagnosis: Difficulty in walking, not elsewhere classified (R26.2)     Time: 9153-9088 PT Time Calculation (min) (ACUTE ONLY): 25 min  Charges:    $Gait Training: 8-22 mins $Therapeutic Exercise: 8-22 mins PT General Charges $$ ACUTE PT VISIT: 1 Visit                     [facility] PT Acute Rehabilitation Services Office [phone]    [facility] 05/29/2024, 11:47 AM

## 2024-05-29 NOTE — Progress Notes (Signed)
 Care turned over for remainder of 7A shift to [facility].  Bedside handoff completed.     05/29/24 1620  Hand-off documentation  Hand-off Given Given to shift RN/LPN  Report given to (Full Name) Aleck KIDD. LPN

## 2024-05-29 NOTE — Progress Notes (Signed)
 Page sent to Dr. Barton with [facility] regarding patient episode of jitters and concerns for low blood glucose.  CBG checked, CBG 74 given apple juice and graham crackers.  CBG rechecked after intervention, CBG 93.   Spoke with Dr. Barton, orders received to check CBGs AC+HS.  No additional interventions/orders at this time.     05/29/24 1157  Provider Notification  Provider Name/Title Dr. Brien Jalene Beers MD Covering  Date Provider Notified 05/29/24  Time Provider Notified 1155  Method of Notification Page  Notification Reason Change in status (Hypoglycemia, rechecked)  Provider response Evaluate remotely;See new orders  Date of Provider Response 05/29/24  Time of Provider Response 1157

## 2024-05-30 DIAGNOSIS — M1612 Unilateral primary osteoarthritis, left hip: Secondary | ICD-10-CM | POA: Diagnosis not present

## 2024-05-30 LAB — GLUCOSE, CAPILLARY
Glucose-Capillary: 104 mg/dL — ABNORMAL HIGH (ref 70–99)
Glucose-Capillary: 117 mg/dL — ABNORMAL HIGH (ref 70–99)

## 2024-05-30 MED ORDER — KETOROLAC TROMETHAMINE 15 MG/ML IJ SOLN
7.5000 mg | Freq: Four times a day (QID) | INTRAMUSCULAR | Status: DC
  Administered 2024-05-30 (×2): 7.5 mg via INTRAVENOUS
  Filled 2024-05-30 (×2): qty 1

## 2024-05-30 NOTE — Progress Notes (Signed)
 Physical Therapy Treatment Patient Details Name: Marie Calhoun MRN: [id number] DOB: [birth year] Today's Date: 05/30/2024   History of Present Illness Pt s/p L THR and with hx of R THR, bil TKR, asthma, Charcot-Marie-Tooth dz,and Sjogrens syndrome.    PT Comments  Pt with some improvement in pain control and up to ambulate increased (but still limited) distance into hallway and able to negotiate 2 single steps with min assist.  Pt requests one additional PT visit this date but still hopeful for dc this pm.    If plan is discharge home, recommend the following: A little help with walking and/or transfers;A little help with bathing/dressing/bathroom;Assistance with cooking/housework;Assist for transportation;Help with stairs or ramp for entrance   Can travel by private vehicle        Equipment Recommendations  None recommended by PT    Recommendations for Other Services       Precautions / Restrictions Precautions Precautions: Fall Recall of Precautions/Restrictions: Intact Restrictions Weight Bearing Restrictions Per Provider Order: No Other Position/Activity Restrictions: WBAT     Mobility  Bed Mobility               General bed mobility comments: Pt up in recliner and returns to same    Transfers Overall transfer level: Needs assistance Equipment used: Rolling walker (2 wheels) Transfers: Sit to/from Stand Sit to Stand: Contact guard assist, Supervision           General transfer comment: min cues for LE management and use of UEs to self assist    Ambulation/Gait Ambulation/Gait assistance: Contact guard assist Gait Distance (Feet): 31 Feet Assistive device: Rolling walker (2 wheels) Gait Pattern/deviations: Step-to pattern, Decreased step length - right, Decreased step length - left, Shuffle, Trunk flexed Gait velocity: decr     General Gait Details: cues for sequence, posture and position from RW; distance ltd by pain/fatigue.  NO noted buckling L LE;  distance ltd by pain/fatigue   Stairs Stairs: Yes Stairs assistance: Min assist Stair Management: No rails, Step to pattern, Backwards, Forwards, With walker Number of Stairs: 2 General stair comments: single step twice - once bkwd and once fwd - cues for sequence   Wheelchair Mobility     Tilt Bed    Modified Rankin (Stroke Patients Only)       Balance Overall balance assessment: Needs assistance Sitting-balance support: No upper extremity supported, Feet supported Sitting balance-Leahy Scale: Good     Standing balance support: Single extremity supported Standing balance-Leahy Scale: Poor                              Communication Communication Communication: No apparent difficulties Factors Affecting Communication: Hearing impaired  Cognition Arousal: Alert Behavior During Therapy: WFL for tasks assessed/performed, Anxious   PT - Cognitive impairments: No apparent impairments                         Following commands: Intact      Cueing Cueing Techniques: Verbal cues  Exercises Total Joint Exercises Ankle Circles/Pumps: AROM, Both, 15 reps, Supine Quad Sets: AROM, Both, 10 reps, Supine Heel Slides: AAROM, Left, 20 reps, Supine Hip ABduction/ADduction: AAROM, Left, 15 reps, Supine Long Arc Quad: AAROM, Left, 15 reps, Seated    General Comments        Pertinent Vitals/Pain Pain Assessment Pain Assessment: 0-10 Pain Score: 6  Pain Location: L hip Pain Descriptors / Indicators: Aching, Burning Pain  Intervention(s): Limited activity within patient's tolerance, Monitored during session, Premedicated before session, Ice applied    Home Living                          Prior Function            PT Goals (current goals can now be found in the care plan section) Acute Rehab PT Goals Patient Stated Goal: Regain IND PT Goal Formulation: With patient Time For Goal Achievement: 06/03/24 Potential to Achieve Goals:  Good Progress towards PT goals: Progressing toward goals    Frequency    7X/week      PT Plan      Co-evaluation              AM-PAC PT 6 Clicks Mobility   Outcome Measure  Help needed turning from your back to your side while in a flat bed without using bedrails?: A Little Help needed moving from lying on your back to sitting on the side of a flat bed without using bedrails?: A Little Help needed moving to and from a bed to a chair (including a wheelchair)?: A Little Help needed standing up from a chair using your arms (e.g., wheelchair or bedside chair)?: A Little Help needed to walk in hospital room?: A Little Help needed climbing 3-5 steps with a railing? : A Little 6 Click Score: 18    End of Session Equipment Utilized During Treatment: Gait belt Activity Tolerance: Patient limited by fatigue;Patient limited by pain Patient left: with call bell/phone within reach;with bed alarm set;in chair Nurse Communication: Mobility status PT Visit Diagnosis: Difficulty in walking, not elsewhere classified (R26.2)     Time: 8956-8897 PT Time Calculation (min) (ACUTE ONLY): 19 min  Charges:    $Gait Training: 8-22 mins $Therapeutic Exercise: 8-22 mins PT General Charges $$ ACUTE PT VISIT: 1 Visit                     [facility] PT Acute Rehabilitation Services Office [phone]    Leanora Murin 05/30/2024, 11:12 AM

## 2024-05-30 NOTE — Progress Notes (Signed)
 Patient ID: Marie Calhoun, female   DOB: February 22, 1951, 73 y.o.   MRN: [id number] Subjective: 3 Days Post-Op Procedure(s) (LRB): ARTHROPLASTY, HIP, TOTAL, ANTERIOR APPROACH (Left)    Patient reports pain as moderate. Having a difficult time with mobility as compared to what she can remember from her right THR Having some issues with glucose management as well  Objective:   VITALS:   Vitals:   05/29/24 2114 05/30/24 0600  BP: 133/76 123/75  Pulse: (!) 103 89  Resp: 18 18  Temp: 98.9 F (37.2 C) 98.2 F (36.8 C)  SpO2: 92% 93%    Neurovascular intact Incision: dressing C/D/I Pain with active hip flexion in the bed  LABS Recent Labs    05/28/24 0332 05/29/24 0350  HGB 10.2* 10.2*  HCT 31.2* 31.1*  WBC 10.4 7.6  PLT 219 203    Recent Labs    05/28/24 0332 05/29/24 0350  NA 133* 133*  K 3.7 3.4*  BUN 13 16  CREATININE 1.12* 1.11*  GLUCOSE 131* 92    No results for input(s): LABPT, INR in the last 72 hours.  Imaging: Review of post op Pelvis X-ray reveals stable prosthesis without obvious complication or concern   Assessment/Plan: 3 Days Post-Op Procedure(s) (LRB): ARTHROPLASTY, HIP, TOTAL, ANTERIOR APPROACH (Left)   Up with therapy Her creatinine has been stable I have prescribed low dose toradol  to see if this will help with her current pain level, only 3 doses If she feels better then she can go home if not she will spend another night with plans for safe home discharge Also will work to stabilize her glucose management while in the hospital  Contact Marie Calin, PA-C if discharge orders needed

## 2024-05-30 NOTE — Progress Notes (Signed)
 Physical Therapy Treatment Patient Details Name: Marie Calhoun MRN: [id number] DOB: Mar 22, 1951 Today's Date: 05/30/2024   History of Present Illness Pt s/p L THR and with hx of R THR, bil TKR, asthma, Charcot-Marie-Tooth dz,and Sjogrens syndrome.    PT Comments  Pt continues cooperative and with noted improvement in stability with gait but distance ltd by pain.  Will follow up following next pain meds.  Pt hopeful for dc home this date.    If plan is discharge home, recommend the following: A little help with walking and/or transfers;A little help with bathing/dressing/bathroom;Assistance with cooking/housework;Assist for transportation;Help with stairs or ramp for entrance   Can travel by private vehicle        Equipment Recommendations  None recommended by PT    Recommendations for Other Services       Precautions / Restrictions Precautions Precautions: Fall Recall of Precautions/Restrictions: Intact Restrictions Weight Bearing Restrictions Per Provider Order: No Other Position/Activity Restrictions: WBAT     Mobility  Bed Mobility               General bed mobility comments: Pt up in recliner and returns to same    Transfers Overall transfer level: Needs assistance Equipment used: Rolling walker (2 wheels) Transfers: Sit to/from Stand Sit to Stand: Contact guard assist           General transfer comment: min cues for LE management and use of UEs to self assist    Ambulation/Gait Ambulation/Gait assistance: Min assist, Contact guard assist Gait Distance (Feet): 18 Feet Assistive device: Rolling walker (2 wheels) Gait Pattern/deviations: Step-to pattern, Decreased step length - right, Decreased step length - left, Shuffle, Trunk flexed Gait velocity: decr     General Gait Details: cues for sequence, posture and position from RW; distance ltd by pain/fatigue.  NO noted buckling L LE; distance ltd by pain/fatigue   Stairs             Wheelchair  Mobility     Tilt Bed    Modified Rankin (Stroke Patients Only)       Balance Overall balance assessment: Needs assistance Sitting-balance support: No upper extremity supported, Feet supported Sitting balance-Leahy Scale: Good     Standing balance support: Single extremity supported Standing balance-Leahy Scale: Poor                              Communication Communication Communication: No apparent difficulties Factors Affecting Communication: Hearing impaired  Cognition Arousal: Alert Behavior During Therapy: WFL for tasks assessed/performed, Anxious   PT - Cognitive impairments: No apparent impairments                         Following commands: Intact      Cueing Cueing Techniques: Verbal cues  Exercises Total Joint Exercises Ankle Circles/Pumps: AROM, Both, 15 reps, Supine Quad Sets: AROM, Both, 10 reps, Supine Heel Slides: AAROM, Left, 20 reps, Supine Hip ABduction/ADduction: AAROM, Left, 15 reps, Supine Long Arc Quad: AAROM, Left, 15 reps, Seated    General Comments        Pertinent Vitals/Pain Pain Assessment Pain Assessment: 0-10 Pain Score: 7  Pain Location: L hip Pain Descriptors / Indicators: Aching, Burning Pain Intervention(s): Limited activity within patient's tolerance, Monitored during session, Ice applied, Premedicated before session (Tor[place] only premed)    Home Living  Prior Function            PT Goals (current goals can now be found in the care plan section) Acute Rehab PT Goals Patient Stated Goal: Regain IND PT Goal Formulation: With patient Time For Goal Achievement: 06/03/24 Potential to Achieve Goals: Good Progress towards PT goals: Progressing toward goals    Frequency    7X/week      PT Plan      Co-evaluation              AM-PAC PT 6 Clicks Mobility   Outcome Measure  Help needed turning from your back to your side while in a flat bed  without using bedrails?: A Little Help needed moving from lying on your back to sitting on the side of a flat bed without using bedrails?: A Little Help needed moving to and from a bed to a chair (including a wheelchair)?: A Little Help needed standing up from a chair using your arms (e.g., wheelchair or bedside chair)?: A Little Help needed to walk in hospital room?: A Little Help needed climbing 3-5 steps with a railing? : A Lot 6 Click Score: 17    End of Session Equipment Utilized During Treatment: Gait belt Activity Tolerance: Patient limited by fatigue;Patient limited by pain Patient left: with call bell/phone within reach;with bed alarm set;in chair Nurse Communication: Mobility status PT Visit Diagnosis: Difficulty in walking, not elsewhere classified (R26.2)     Time: 0912-0940 PT Time Calculation (min) (ACUTE ONLY): 28 min  Charges:    $Gait Training: 8-22 mins $Therapeutic Exercise: 8-22 mins PT General Charges $$ ACUTE PT VISIT: 1 Visit                     [facility] PT Acute Rehabilitation Services Office [phone]    [facility] 05/30/2024, 10:27 AM

## 2024-05-30 NOTE — Progress Notes (Signed)
 Pt went to bathroom with one assist. Pt denies any pain at this time. Pt is sitting up in chair eating breakfast

## 2024-05-30 NOTE — Progress Notes (Signed)
 Physical Therapy Treatment Patient Details Name: Marie Calhoun MRN: [id number] DOB: [birth year] Today's Date: 05/30/2024   History of Present Illness Pt s/p L THR and with hx of R THR, bil TKR, asthma, Charcot-Marie-Tooth dz,and Sjogrens syndrome.    PT Comments  Pt continues very cooperative with steady progress with mobility.  Pt reviewed car transfers, up to ambulate increased distance in hall with RW demonstrating improved stability and with questions asked and answered.  Pt states feeling more comfortable with dc home this date.    If plan is discharge home, recommend the following: A little help with walking and/or transfers;A little help with bathing/dressing/bathroom;Assistance with cooking/housework;Assist for transportation;Help with stairs or ramp for entrance   Can travel by private vehicle        Equipment Recommendations  None recommended by PT    Recommendations for Other Services       Precautions / Restrictions Precautions Precautions: Fall Recall of Precautions/Restrictions: Intact Restrictions Weight Bearing Restrictions Per Provider Order: No Other Position/Activity Restrictions: WBAT     Mobility  Bed Mobility               General bed mobility comments: Pt up in recliner and returns to same    Transfers Overall transfer level: Needs assistance Equipment used: Rolling walker (2 wheels) Transfers: Sit to/from Stand Sit to Stand: Supervision           General transfer comment: min cues for LE management and use of UEs to self assist    Ambulation/Gait Ambulation/Gait assistance: Contact guard assist, Supervision Gait Distance (Feet): 65 Feet Assistive device: Rolling walker (2 wheels) Gait Pattern/deviations: Step-to pattern, Decreased step length - right, Decreased step length - left, Shuffle, Trunk flexed Gait velocity: decr     General Gait Details: Min cues for posture and position from RW; distance ltd by fatigue.  NO noted  buckling L LE;   Stairs Stairs: Yes Stairs assistance: Min assist Stair Management: No rails, Step to pattern, Backwards, Forwards, With walker Number of Stairs: 2 General stair comments: Pt states comfortable with stairs and declines to reattempt   Wheelchair Mobility     Tilt Bed    Modified Rankin (Stroke Patients Only)       Balance Overall balance assessment: Needs assistance Sitting-balance support: No upper extremity supported, Feet supported Sitting balance-Leahy Scale: Good     Standing balance support: No upper extremity supported Standing balance-Leahy Scale: Fair                              Hotel manager: No apparent difficulties Factors Affecting Communication: Hearing impaired  Cognition Arousal: Alert Behavior During Therapy: WFL for tasks assessed/performed, Anxious   PT - Cognitive impairments: No apparent impairments                         Following commands: Intact      Cueing Cueing Techniques: Verbal cues  Exercises Total Joint Exercises Ankle Circles/Pumps: AROM, Both, 15 reps, Supine Quad Sets: AROM, Both, 10 reps, Supine Heel Slides: AAROM, Left, 20 reps, Supine Hip ABduction/ADduction: AAROM, Left, 15 reps, Supine Long Arc Quad: AAROM, Left, 15 reps, Seated    General Comments        Pertinent Vitals/Pain Pain Assessment Pain Assessment: 0-10 Pain Score: 6  Pain Location: L hip Pain Descriptors / Indicators: Aching, Burning Pain Intervention(s): Limited activity within patient's tolerance, Monitored during session, Premedicated  before session, Ice applied    Home Living                          Prior Function            PT Goals (current goals can now be found in the care plan section) Acute Rehab PT Goals Patient Stated Goal: Regain IND PT Goal Formulation: With patient Time For Goal Achievement: 06/03/24 Potential to Achieve Goals: Good Progress  towards PT goals: Progressing toward goals    Frequency    7X/week      PT Plan      Co-evaluation              AM-PAC PT 6 Clicks Mobility   Outcome Measure  Help needed turning from your back to your side while in a flat bed without using bedrails?: A Little Help needed moving from lying on your back to sitting on the side of a flat bed without using bedrails?: A Little Help needed moving to and from a bed to a chair (including a wheelchair)?: A Little Help needed standing up from a chair using your arms (e.g., wheelchair or bedside chair)?: A Little Help needed to walk in hospital room?: A Little Help needed climbing 3-5 steps with a railing? : A Little 6 Click Score: 18    End of Session Equipment Utilized During Treatment: Gait belt Activity Tolerance: Patient limited by fatigue Patient left: with call bell/phone within reach;with bed alarm set;in chair Nurse Communication: Mobility status PT Visit Diagnosis: Difficulty in walking, not elsewhere classified (R26.2)     Time: 1240-1305 PT Time Calculation (min) (ACUTE ONLY): 25 min  Charges:    $Gait Training: 8-22 mins $Therapeutic Exercise: 8-22 mins $Therapeutic Activity: 8-22 mins PT General Charges $$ ACUTE PT VISIT: 1 Visit                     [facility] PT Acute Rehabilitation Services Office [phone]    Camber Ninh 05/30/2024, 1:47 PM

## 2024-06-01 NOTE — Discharge Summary (Signed)
 Physician Discharge Summary  Patient ID: Marie Calhoun MRN: [id number] DOB/AGE: 06-12-1951 73 y.o.  Admit date: 05/27/2024 Discharge date: 05/30/24  Admission Diagnoses:  S/P total left hip arthroplasty  Discharge Diagnoses:  Principal Problem:   S/P total left hip arthroplasty   Past Medical History:  Diagnosis Date   Anxiety    Arthritis    Asthma 05/28/2017   Cancer (HCC)    skin cancer   Charcot-Marie-Tooth disease    Depression    Dyspnea    with exertion   Environmental allergies 05/28/2017   Esophageal stricture 05/28/2017   Essential hypertension, benign 05/28/2017   GERD (gastroesophageal reflux disease) 05/28/2017   Hypercholesteremia    Lupus    of skin   PONV (postoperative nausea and vomiting)    Seasonal allergies    Sjogren's syndrome (HCC)     Surgeries: Procedure(s): ARTHROPLASTY, HIP, TOTAL, ANTERIOR APPROACH on 05/27/2024   Consultants (if any):   Discharged Condition: Improved  Hospital Course: Marie Calhoun is an 73 y.o. female who was admitted 05/27/2024 with a diagnosis of S/P total left hip arthroplasty and went to the operating room on 05/27/2024 and underwent the above named procedures.    She was given perioperative antibiotics:  Anti-infectives (From admission, onward)    Start     Dose/Rate Route Frequency Ordered Stop   05/27/24 1630  ceFAZolin  (ANCEF ) IVPB 2g/100 mL premix        2 g 200 mL/hr over 30 Minutes Intravenous Every 6 hours 05/27/24 1438 05/27/24 2220   05/27/24 1438  acyclovir  (ZOVIRAX ) tablet 400 mg  Status:  Discontinued        400 mg Oral 3 times daily PRN 05/27/24 1438 05/30/24 2202   05/27/24 0800  ceFAZolin  (ANCEF ) IVPB 2g/100 mL premix        2 g 200 mL/hr over 30 Minutes Intravenous On call to O.R. 05/27/24 0748 05/27/24 1030       She was given sequential compression devices, early ambulation, and aspirin  for DVT prophylaxis.  POD#1 Slow progression with PT she ambulated 28 and 75 feet. Creatinine slightly  elevated. PO and IV fluids pushed.  POD#2 PT limited by nausea and pain she ambulated 18 and 28 feet. POD#3 Pain improved she ambulated 18, 31, and 65 feet with PT. D/c home with HEP. Follow-up in office in 2 weeks.    She benefited maximally from the hospital stay and there were no complications.    Recent vital signs:  Vitals:   05/30/24 0945 05/30/24 1450  BP: 123/75 110/64  Pulse: 89 77  Resp:  17  Temp:  97.9 F (36.6 C)  SpO2:  97%    Recent laboratory studies:  Lab Results  Component Value Date   HGB 10.2 (L) 05/29/2024   HGB 10.2 (L) 05/28/2024   HGB 13.0 05/17/2024   Lab Results  Component Value Date   WBC 7.6 05/29/2024   PLT 203 05/29/2024   No results found for: INR Lab Results  Component Value Date   NA 133 (L) 05/29/2024   K 3.4 (L) 05/29/2024   CL 100 05/29/2024   CO2 24 05/29/2024   BUN 16 05/29/2024   CREATININE 1.11 (H) 05/29/2024   GLUCOSE 92 05/29/2024     Allergies as of 05/30/2024       Reactions   Doxycycline Itching, Rash   Demerol  [meperidine ] Nausea Only   Pantoprazole  Other (See Comments)   Subacute Cutaneous Lupus of the Skin   Plaquenil [hydroxychloroquine]  Caused another Rash on top of Lupus Rash   Sulfa Antibiotics Swelling   *Childhood*   Sulfamethoxazole-trimethoprim Other (See Comments)   Childhood    Ranitidine Rash        Medication List     STOP taking these medications    methotrexate 5 MG tablet Commonly known as: RHEUMATREX   predniSONE 5 MG tablet Commonly known as: DELTASONE       TAKE these medications    acetaminophen  500 MG tablet Commonly known as: TYLENOL  Take 2 tablets (1,000 mg total) by mouth every 8 (eight) hours as needed for mild pain, moderate pain or fever. What changed: when to take this   acyclovir  400 MG tablet Commonly known as: ZOVIRAX  Take 400 mg by mouth 3 (three) times daily as needed (for cold sores/fever blisters (takes for 7 days when needed)).   albuterol  108 (90  Base) MCG/ACT inhaler Commonly known as: VENTOLIN  HFA INHALE 2 PUFFS INTO THE LUNGS EVERY 6 (SIX) HOURS AS NEEDED FOR WHEEZING OR SHORTNESS OF BREATH.   amoxicillin 500 MG capsule Commonly known as: AMOXIL Take 2,000 mg by mouth See admin instructions. Take 1 hour prior to dental work   Aspirin  Low Dose 81 MG chewable tablet Generic drug: aspirin  Chew 1 tablet (81 mg total) by mouth 2 (two) times daily with a meal.   B-12 2500 MCG Tabs Take 2,500 mcg by mouth daily.   busPIRone  10 MG tablet Commonly known as: BUSPAR  Take 10 mg by mouth 2 (two) times daily.   CALCIUM  600 + D PO Take 1 tablet by mouth 2 (two) times daily.   cycloSPORINE  0.05 % ophthalmic emulsion Commonly known as: RESTASIS  Place 1 drop into both eyes 2 (two) times daily.   docusate sodium  100 MG capsule Commonly known as: Colace Take 1 capsule (100 mg total) by mouth 2 (two) times daily.   fluticasone  50 MCG/ACT nasal spray Commonly known as: FLONASE  USE 1 TO 2 SPRAYS IN EACH NOSTRIL EVERY DAY AS NEEDED FOR ALLERGIES.   folic acid  1 MG tablet Commonly known as: FOLVITE  Take 1 mg by mouth daily.   gabapentin  300 MG capsule Commonly known as: NEURONTIN  Take 300-600 mg by mouth See admin instructions. 300  mg twice daily, 600 mg at bedtime   ketoconazole 2 % cream Commonly known as: NIZORAL Apply 1 Application topically daily as needed (fungus). Apply to nail and finger around affected area   lansoprazole  30 MG capsule Commonly known as: PREVACID  TAKE 1 CAPSULE ONCE DAILY ON AN EMPTY STOMACH 30-45 MINUTES BEFORE BREAKFAST   Linzess  145 MCG Caps capsule Generic drug: linaclotide  TAKE 1 CAPSULE EVERY DAY   losartan -hydrochlorothiazide  50-12.5 MG tablet Commonly known as: HYZAAR Take 0.5 tablets by mouth at bedtime.   methocarbamol  500 MG tablet Commonly known as: ROBAXIN  Take 1 tablet (500 mg total) by mouth every 6 (six) hours as needed.   metoprolol  succinate 25 MG 24 hr tablet Commonly  known as: TOPROL -XL Take 25 mg by mouth 2 (two) times daily.   mineral oil light external liquid Apply 1 Application topically daily as needed (In ears of needed).   multivitamin with minerals Tabs tablet Take 1 tablet by mouth in the morning. Woman's   Myrbetriq  25 MG Tb24 tablet Generic drug: mirabegron  ER TAKE 1 TABLET EVERY DAY What changed: when to take this   ondansetron  4 MG tablet Commonly known as: Zofran  Take 1 tablet (4 mg total) by mouth every 8 (eight) hours as needed for nausea  or vomiting. What changed:  when to take this reasons to take this   oxyCODONE  5 MG immediate release tablet Commonly known as: Roxicodone  Take 1 tablet (5 mg total) by mouth every 4 (four) hours as needed for severe pain (pain score 7-10).   polyethylene glycol 17 g packet Commonly known as: MIRALAX  / GLYCOLAX  Take 17 g by mouth daily as needed for mild constipation.   senna 8.6 MG Tabs tablet Commonly known as: SENOKOT Take 2 tablets (17.2 mg total) by mouth at bedtime for 15 days.   silver  sulfADIAZINE  1 % cream Commonly known as: SILVADENE  Apply 1 application topically daily as needed (foot wounds/sores).   triamcinolone  cream 0.1 % Commonly known as: KENALOG  Apply 1 Application topically daily as needed (rash).               Discharge Care Instructions  (From admission, onward)           Start     Ordered   05/30/24 0000  Weight bearing as tolerated        05/30/24 1619   05/30/24 0000  Change dressing       Comments: Do not change your dressing.   05/30/24 1619              WEIGHT BEARING   Weight bearing as tolerated with assist device (walker, cane, etc) as directed, use it as long as suggested by your surgeon or therapist, typically at least 4-6 weeks.   EXERCISES  Results after joint replacement surgery are often greatly improved when you follow the exercise, range of motion and muscle strengthening exercises prescribed by your doctor. Safety  measures are also important to protect the joint from further injury. Any time any of these exercises cause you to have increased pain or swelling, decrease what you are doing until you are comfortable again and then slowly increase them. If you have problems or questions, call your caregiver or physical therapist for advice.   Rehabilitation is important following a joint replacement. After just a few days of immobilization, the muscles of the leg can become weakened and shrink (atrophy).  These exercises are designed to build up the tone and strength of the thigh and leg muscles and to improve motion. Often times heat used for twenty to thirty minutes before working out will loosen up your tissues and help with improving the range of motion but do not use heat for the first two weeks following surgery (sometimes heat can increase post-operative swelling).   These exercises can be done on a training (exercise) mat, on the floor, on a table or on a bed. Use whatever works the best and is most comfortable for you.    Use music or television while you are exercising so that the exercises are a pleasant break in your day. This will make your life better with the exercises acting as a break in your routine that you can look forward to.   Perform all exercises about fifteen times, three times per day or as directed.  You should exercise both the operative leg and the other leg as well.  Exercises include:   Quad Sets - Tighten up the muscle on the front of the thigh (Quad) and hold for 5-10 seconds.   Straight Leg Raises - With your knee straight (if you were given a brace, keep it on), lift the leg to 60 degrees, hold for 3 seconds, and slowly lower the leg.  Perform this exercise against  resistance later as your leg gets stronger.  Leg Slides: Lying on your back, slowly slide your foot toward your buttocks, bending your knee up off the floor (only go as far as is comfortable). Then slowly slide your foot back  down until your leg is flat on the floor again.  Angel Wings: Lying on your back spread your legs to the side as far apart as you can without causing discomfort.  Hamstring Strength:  Lying on your back, push your heel against the floor with your leg straight by tightening up the muscles of your buttocks.  Repeat, but this time bend your knee to a comfortable angle, and push your heel against the floor.  You may put a pillow under the heel to make it more comfortable if necessary.   A rehabilitation program following joint replacement surgery can speed recovery and prevent re-injury in the future due to weakened muscles. Contact your doctor or a physical therapist for more information on knee rehabilitation.    CONSTIPATION  Constipation is defined medically as fewer than three stools per week and severe constipation as less than one stool per week.  Even if you have a regular bowel pattern at home, your normal regimen is likely to be disrupted due to multiple reasons following surgery.  Combination of anesthesia, postoperative narcotics, change in appetite and fluid intake all can affect your bowels.   YOU MUST use at least one of the following options; they are listed in order of increasing strength to get the job done.  They are all available over the counter, and you may need to use some, POSSIBLY even all of these options:    Drink plenty of fluids (prune juice may be helpful) and high fiber foods Colace 100 mg by mouth twice a day  Senokot for constipation as directed and as needed Dulcolax (bisacodyl ), take with full glass of water   Miralax  (polyethylene glycol) once or twice a day as needed.  If you have tried all these things and are unable to have a bowel movement in the first 3-4 days after surgery call either your surgeon or your primary doctor.    If you experience loose stools or diarrhea, hold the medications until you stool forms back up.  If your symptoms do not get better within  1 week or if they get worse, check with your doctor.  If you experience the worst abdominal pain ever or develop nausea or vomiting, please contact the office immediately for further recommendations for treatment.   ITCHING:  If you experience itching with your medications, try taking only a single pain pill, or even half a pain pill at a time.  You can also use Benadryl  over the counter for itching or also to help with sleep.   TED HOSE STOCKINGS:  Use stockings on both legs until for at least 2 weeks or as directed by physician office. They may be removed at night for sleeping.  MEDICATIONS:  See your medication summary on the "After Visit Summary" that nursing will review with you.  You may have some home medications which will be placed on hold until you complete the course of blood thinner medication.  It is important for you to complete the blood thinner medication as prescribed.  PRECAUTIONS:  If you experience chest pain or shortness of breath - call 911 immediately for transfer to the hospital emergency department.   If you develop a fever greater that 101 F, purulent drainage from wound, increased redness  or drainage from wound, foul odor from the wound/dressing, or calf pain - CONTACT YOUR SURGEON.                                                   FOLLOW-UP APPOINTMENTS:  If you do not already have a post-op appointment, please call the office for an appointment to be seen by your surgeon.  Guidelines for how soon to be seen are listed in your "After Visit Summary", but are typically between 1-4 weeks after surgery.  OTHER INSTRUCTIONS:   Knee Replacement:  Do not place pillow under knee, focus on keeping the knee straight while resting. CPM instructions: 0-90 degrees, 2 hours in the morning, 2 hours in the afternoon, and 2 hours in the evening. Place foam block, curve side up under heel at all times except when in CPM or when walking.  DO NOT modify, tear, cut, or change the foam block  in any way.   MAKE SURE YOU:  Understand these instructions.  Get help right away if you are not doing well or get worse.    Thank you for letting us  be a part of your medical care team.  It is a privilege we respect greatly.  We hope these instructions will help you stay on track for a fast and full recovery!   Diagnostic Studies: DG Pelvis Portable Result Date: 05/27/2024 CLINICAL DATA:  Status post left total hip arthroplasty. EXAM: PORTABLE PELVIS 1-2 VIEWS COMPARISON:  May 30, 2022. FINDINGS: Left acetabular and femoral components are well situated. Expected postoperative changes are seen in the surrounding soft tissues. IMPRESSION: Status post left total hip arthroplasty. Electronically Signed   By: Lynwood Landy Raddle M.D.   On: 05/27/2024 14:38   DG HIP UNILAT WITH PELVIS 1V LEFT Result Date: 05/27/2024 CLINICAL DATA:  Left hip replacement. EXAM: DG HIP (WITH OR WITHOUT PELVIS) 1V*L*; DG C-ARM 1-60 MIN-NO REPORT Radiation exposure index: 1.3149 mGy. COMPARISON:  None Available. FINDINGS: Two intraoperative fluoroscopic images were obtained of the left hip. Left femoral and acetabular components are well situated. IMPRESSION: Fluoroscopic guidance provided during left total hip arthroplasty. Electronically Signed   By: Lynwood Landy Raddle M.D.   On: 05/27/2024 14:37   DG C-Arm 1-60 Min-No Report Result Date: 05/27/2024 Fluoroscopy was utilized by the requesting physician.  No radiographic interpretation.   Ultrasound renal complete Result Date: 05/06/2024 CLINICAL DATA:  Cyst monitoring EXAM: RENAL / URINARY TRACT ULTRASOUND COMPLETE COMPARISON:  Ultrasound 04/30/2023, 04/29/2022 FINDINGS: Right Kidney: Renal measurements: 14.8 x 6.8 x 5.7 cm = volume: 305.7 mL. Echogenicity is normal. No hydronephrosis. Cyst containing single septation, this measures 5.7 x 5.4 by 5.6 cm, previously 6.9 by 6.5 x 5.7 cm. Left Kidney: Renal measurements: 10.5 x 4.6 x 4.3 cm = volume: 111 mL. Echogenicity within  normal limits. No mass or hydronephrosis visualized. Bladder: Appears normal for degree of bladder distention. Other: None. IMPRESSION: 1. No hydronephrosis. 2. 5.7 cm right renal cyst containing single septation, previously 6.9 cm. No specific imaging follow-up is recommended Electronically Signed   By: Luke Bun M.D.   On: 05/06/2024 23:31    Disposition: Discharge disposition: 01-Home or Self Care       Discharge Instructions     Call MD / Call 911   Complete by: As directed    If you experience  chest pain or shortness of breath, CALL 911 and be transported to the hospital emergency room.  If you develope a fever above 101 F, pus (white drainage) or increased drainage or redness at the wound, or calf pain, call your surgeon's office.   Change dressing   Complete by: As directed    Do not change your dressing.   Constipation Prevention   Complete by: As directed    Drink plenty of fluids.  Prune juice may be helpful.  You may use a stool softener, such as Colace (over the counter) 100 mg twice a day.  Use MiraLax  (over the counter) for constipation as needed.   Diet - low sodium heart healthy   Complete by: As directed    Discharge instructions   Complete by: As directed    Elevate toes above nose. Use cryotherapy as needed for pain and swelling.   Driving restrictions   Complete by: As directed    No driving for 6 weeks   Increase activity slowly as tolerated   Complete by: As directed    Lifting restrictions   Complete by: As directed    No lifting for 6 weeks   Post-operative opioid taper instructions:   Complete by: As directed    POST-OPERATIVE OPIOID TAPER INSTRUCTIONS: It is important to wean off of your opioid medication as soon as possible. If you do not need pain medication after your surgery it is ok to stop day one. Opioids include: Codeine, Hydrocodone (Norco, Vicodin), Oxycodone (Percocet, oxycontin ) and hydromorphone  amongst others.  Long term and even short  term use of opiods can cause: Increased pain response Dependence Constipation Depression Respiratory depression And more.  Withdrawal symptoms can include Flu like symptoms Nausea, vomiting And more Techniques to manage these symptoms Hydrate well Eat regular healthy meals Stay active Use relaxation techniques(deep breathing, meditating, yoga) Do Not substitute Alcohol  to help with tapering If you have been on opioids for less than two weeks and do not have pain than it is ok to stop all together.  Plan to wean off of opioids This plan should start within one week post op of your joint replacement. Maintain the same interval or time between taking each dose and first decrease the dose.  Cut the total daily intake of opioids by one tablet each day Next start to increase the time between doses. The last dose that should be eliminated is the evening dose.      TED hose   Complete by: As directed    Use stockings (TED hose) for 2 weeks on both leg(s).  You may remove them at night for sleeping.   Weight bearing as tolerated   Complete by: As directed         Follow-up Information     Leigh Valery RAMAN, PA-C. Schedule an appointment as soon as possible for a visit in 2 week(s).   Specialty: Orthopedic Surgery Why: For suture removal, For wound re-check Contact information: [address] [place] [address] [phone]                  Signed: Valery RAMAN Leigh 06/01/2024, 2:02 PM

## 2024-06-10 ENCOUNTER — Ambulatory Visit (INDEPENDENT_AMBULATORY_CARE_PROVIDER_SITE_OTHER): Payer: Medicare Other | Admitting: Gastroenterology

## 2024-06-14 ENCOUNTER — Ambulatory Visit (INDEPENDENT_AMBULATORY_CARE_PROVIDER_SITE_OTHER): Payer: Medicare Other | Admitting: Gastroenterology

## 2024-06-24 ENCOUNTER — Ambulatory Visit (INDEPENDENT_AMBULATORY_CARE_PROVIDER_SITE_OTHER): Admitting: Gastroenterology

## 2024-06-24 ENCOUNTER — Encounter (INDEPENDENT_AMBULATORY_CARE_PROVIDER_SITE_OTHER): Payer: Self-pay | Admitting: Gastroenterology

## 2024-06-24 VITALS — BP 95/60 | HR 70 | Temp 98.2°F | Ht 68.0 in | Wt 160.1 lb

## 2024-06-24 DIAGNOSIS — R197 Diarrhea, unspecified: Secondary | ICD-10-CM | POA: Diagnosis not present

## 2024-06-24 DIAGNOSIS — K219 Gastro-esophageal reflux disease without esophagitis: Secondary | ICD-10-CM | POA: Diagnosis not present

## 2024-06-24 DIAGNOSIS — K2100 Gastro-esophageal reflux disease with esophagitis, without bleeding: Secondary | ICD-10-CM

## 2024-06-24 MED ORDER — OMEPRAZOLE 40 MG PO CPDR: 40.0000 mg | DELAYED_RELEASE_CAPSULE | Freq: Every day | ORAL | 3 refills | Status: AC

## 2024-06-24 NOTE — Progress Notes (Signed)
 Referring Provider: Diedra Senior, MD Primary Care Physician:  Diedra Senior, MD Primary GI Physician: Dr. Cinderella   Chief Complaint  Patient presents with   Follow-up    Pt arrives for follow up. Pt states she began to have diarrhea last Tuesday. Pt had left hip surgery in July and went to get staples out of hip. Pt has been taking Imodium. Pt was told last Tuesday she was dehydrated and to drink Gatorade. Pt states she feels drained and feels not like herself. Pt states when she eats it goes right through her. Has had some weight loss. Pt did have some nausea but that improved once she began drinking Gatorade.    HPI:   Marie Calhoun is a 73 y.o. female with past medical history of  anxiety, arthritis, asthma, skin cancer, CMT disease, depression, HTN, GERD, high cholesterol, lupus.    Patient presenting today for:  Follow up of GERD  New onset diarrhea  Last seen July 2024, at that time, GERD well controlled on lansoprazole  30mg  daily. Taking linzess  72mcg daily, stool softener as well. Having a BM every 3 days, stools harder. Recently diagnosed with Sjogren's syndrome and OA, OfF methotrexate for lupus due to upcoming knee surgery  Recommended Increase linzess  to 145mcg daily  Increase water  intake, aim for atleast 64 oz per day Increase fruits, veggies and whole grains, kiwi and prunes are especially good for constipation, Continue lansoprazole  30mg  daily, good reflux precautions,  Can use Gas x or phazyme otc  Present: Patient states hip surgery on 7/29, she went for follow up of staple removal last week with her orthopedist, was told she seemed dehydrated due to skin turgor. Has been drinking gatorade and trying to get her electrolytes back to normal. She notes that diarrhea began on 8/5. She is unsure about recent antibiotics with her surgery. Has not taken prophylactic amoxicillin for dental procedures recently. Feels drained. She is having upwards of 5 BMs per day, can  sometimes be as low as 1-2. Stools are watery to loose. No recent abdominal pain, endorses some lower back pain. No rectal bleeding or melena. She had some nausea when diarrhea first began but no vomiting. Took some oxycodone  initially after surgery but transitioned to tylenol . She did have some constipation prior to her diarrhea onset, up to 3 days without a BM. She reports she had been taking her linzess  accidentally, has not taken it today. She has been using imodium which seems to slow things down some. Denies rectal bleeding or melena. Appetite is not good. Did receive Ancef  during operative phase.   Taking lansoprazole  30mg  an hour before she eats each morning. Having more reflux symptoms recently. Coughing up some clear liquid. Having symptoms almost daily, has famotidine  on hand but hasn't taken this yet as she was unsure if she could take this with her lansoprazole . No dysphagia or odynophagia.   Last Colonoscopy:2022   - Two 4 to 7 mm polyps in the rectum and in the                            sigmoid colon, removed with a cold snare. Resected                            and retrieved. (Hyperplastic polyp)   Last Endoscopy:2019   Recommendations:  Repeat colonoscopy in 10 years     Past Medical History:  Diagnosis Date   Anxiety    Arthritis    Asthma 05/28/2017   Cancer (HCC)    skin cancer   Charcot-Marie-Tooth disease    Depression    Dyspnea    with exertion   Environmental allergies 05/28/2017   Esophageal stricture 05/28/2017   Essential hypertension, benign 05/28/2017   GERD (gastroesophageal reflux disease) 05/28/2017   Hypercholesteremia    Lupus    of skin   PONV (postoperative nausea and vomiting)    Seasonal allergies    Sjogren's syndrome (HCC)     Past Surgical History:  Procedure Laterality Date   breast tumor\     left benign   CHOLECYSTECTOMY     COLONOSCOPY WITH PROPOFOL  N/A 05/30/2021   Procedure: COLONOSCOPY WITH PROPOFOL ;  Surgeon: Golda Claudis PENNER, MD;  Location: [facility];  Service: Endoscopy;  Laterality: N/A;  855   ESOPHAGEAL DILATION N/A 09/11/2017   Procedure: ESOPHAGEAL DILATION;  Surgeon: Golda Claudis PENNER, MD;  Location: [facility];  Service: Endoscopy;  Laterality: N/A;   ESOPHAGEAL DILATION N/A 06/17/2018   Procedure: ESOPHAGEAL DILATION;  Surgeon: Golda Claudis PENNER, MD;  Location: [facility];  Service: Endoscopy;  Laterality: N/A;   ESOPHAGOGASTRODUODENOSCOPY N/A 09/11/2017   Procedure: ESOPHAGOGASTRODUODENOSCOPY (EGD);  Surgeon: Golda Claudis PENNER, MD;  Location: [facility];  Service: Endoscopy;  Laterality: N/A;  1:40 - Can NOT come earlier   ESOPHAGOGASTRODUODENOSCOPY N/A 06/17/2018   Procedure: ESOPHAGOGASTRODUODENOSCOPY (EGD);  Surgeon: Golda Claudis PENNER, MD;  Location: [facility];  Service: Endoscopy;  Laterality: N/A;  10:30   hand tendon release Left    KNEE ARTHROPLASTY Right 07/17/2022   Procedure: COMPUTER ASSISTED TOTAL KNEE ARTHROPLASTY;  Surgeon: Fidel Rogue, MD;  Location: [facility];  Service: Orthopedics;  Laterality: Right;  150   KNEE ARTHROPLASTY Left 06/25/2023   Procedure: COMPUTER ASSISTED TOTAL KNEE ARTHROPLASTY;  Surgeon: Fidel Rogue, MD;  Location: [facility];  Service: Orthopedics;  Laterality: Left;   knee surgeries     1980   LUMBAR EPIDURAL INJECTION     PARTIAL HYSTERECTOMY     1995   POLYPECTOMY  05/30/2021   Procedure: POLYPECTOMY;  Surgeon: Golda Claudis PENNER, MD;  [facility]: [facility];  Service: Endoscopy;;   TOTAL HIP ARTHROPLASTY Right 05/30/2022   Procedure: TOTAL HIP ARTHROPLASTY ANTERIOR APPROACH;  Surgeon: Fidel Rogue, MD;  Location: [facility];  Service: Orthopedics;  Laterality: Right;   TOTAL HIP ARTHROPLASTY Left 05/27/2024   Procedure: ARTHROPLASTY, HIP, TOTAL, ANTERIOR APPROACH;  Surgeon: Fidel Rogue, MD;  Location: [facility];  Service: Orthopedics;  Laterality: Left;    Current Outpatient Medications  Medication Sig Dispense Refill   acyclovir  (ZOVIRAX ) 400 MG  tablet Take 400 mg by mouth 3 (three) times daily as needed (for cold sores/fever blisters (takes for 7 days when needed)).     amoxicillin (AMOXIL) 500 MG capsule Take 2,000 mg by mouth See admin instructions. Take 1 hour prior to dental work     busPIRone  (BUSPAR ) 10 MG tablet Take 10 mg by mouth 2 (two) times daily.     Cyanocobalamin  (B-12) 2500 MCG TABS Take 2,500 mcg by mouth daily.     cycloSPORINE  (RESTASIS ) 0.05 % ophthalmic emulsion Place 1 drop into both eyes 2 (two) times daily.     fluticasone  (FLONASE ) 50 MCG/ACT nasal spray USE 1 TO 2 SPRAYS IN EACH NOSTRIL EVERY DAY AS NEEDED FOR ALLERGIES. 16 g 10   gabapentin  (NEURONTIN ) 300 MG capsule Take 300-600 mg  by mouth See admin instructions. 300  mg twice daily, 600 mg at bedtime     lansoprazole  (PREVACID ) 30 MG capsule TAKE 1 CAPSULE ONCE DAILY ON AN EMPTY STOMACH 30-45 MINUTES BEFORE BREAKFAST 90 capsule 3   LINZESS  145 MCG CAPS capsule TAKE 1 CAPSULE EVERY DAY 90 capsule 3   losartan -hydrochlorothiazide  (HYZAAR) 50-12.5 MG tablet Take 0.5 tablets by mouth at bedtime.     metoprolol  succinate (TOPROL -XL) 25 MG 24 hr tablet Take 25 mg by mouth 2 (two) times daily.     MYRBETRIQ  25 MG TB24 tablet TAKE 1 TABLET EVERY DAY 90 tablet 3   acetaminophen  (TYLENOL ) 500 MG tablet Take 2 tablets (1,000 mg total) by mouth every 8 (eight) hours as needed for mild pain, moderate pain or fever. (Patient taking differently: Take 1,000 mg by mouth 3 (three) times daily.) 30 tablet 0   albuterol  (VENTOLIN  HFA) 108 (90 Base) MCG/ACT inhaler INHALE 2 PUFFS INTO THE LUNGS EVERY 6 (SIX) HOURS AS NEEDED FOR WHEEZING OR SHORTNESS OF BREATH. 1 each 2   aspirin  (ASPIRIN  CHILDRENS) 81 MG chewable tablet Chew 1 tablet (81 mg total) by mouth 2 (two) times daily with a meal. 90 tablet 0   Calcium  Carb-Cholecalciferol  (CALCIUM  600 + D PO) Take 1 tablet by mouth 2 (two) times daily.     docusate sodium  (COLACE) 100 MG capsule Take 1 capsule (100 mg total) by mouth 2  (two) times daily. 60 capsule 0   folic acid  (FOLVITE ) 1 MG tablet Take 1 mg by mouth daily.     ketoconazole (NIZORAL) 2 % cream Apply 1 Application topically daily as needed (fungus). Apply to nail and finger around affected area     methocarbamol  (ROBAXIN ) 500 MG tablet Take 1 tablet (500 mg total) by mouth every 6 (six) hours as needed. 20 tablet 0   methotrexate (RHEUMATREX) 2.5 MG tablet TAKE 4 TABLETS BY MOUTH EVERY WEDNESDAY (Patient not taking: Reported on 06/24/2024)     mineral oil light external liquid Apply 1 Application topically daily as needed (In ears of needed).     Multiple Vitamin (MULTIVITAMIN WITH MINERALS) TABS tablet Take 1 tablet by mouth in the morning. Woman's     ondansetron  (ZOFRAN ) 4 MG tablet Take 1 tablet (4 mg total) by mouth every 8 (eight) hours as needed for nausea or vomiting. 30 tablet 0   oxyCODONE  (ROXICODONE ) 5 MG immediate release tablet Take 1 tablet (5 mg total) by mouth every 4 (four) hours as needed for severe pain (pain score 7-10). 40 tablet 0   polyethylene glycol (MIRALAX  / GLYCOLAX ) 17 g packet Take 17 g by mouth daily as needed for mild constipation. 14 each 0   predniSONE (DELTASONE) 5 MG tablet Take 5 mg by mouth daily. (Patient not taking: Reported on 06/24/2024)     silver  sulfADIAZINE  (SILVADENE ) 1 % cream Apply 1 application topically daily as needed (foot wounds/sores).     triamcinolone  cream (KENALOG ) 0.1 % Apply 1 Application topically daily as needed (rash).     No current facility-administered medications for this visit.    Allergies as of 06/24/2024 - Review Complete 06/24/2024  Allergen Reaction Noted   Doxycycline Itching and Rash 10/30/2018   Demerol  [meperidine ] Nausea Only 05/28/2017   Pantoprazole  Other (See Comments) 06/15/2018   Plaquenil [hydroxychloroquine]  06/15/2018   Sulfa antibiotics Swelling 05/28/2017   Sulfamethoxazole-trimethoprim Other (See Comments) 03/12/2022   Ranitidine Rash 10/30/2018    Social History    Socioeconomic History  Marital status: Single    Spouse name: Not on file   Number of children: Not on file   Years of education: Not on file   Highest education level: Not on file  Occupational History   Not on file  Tobacco Use   Smoking status: Never    Passive exposure: Never   Smokeless tobacco: Never  Vaping Use   Vaping status: Never Used  Substance and Sexual Activity   Alcohol  use: No   Drug use: No   Sexual activity: Not Currently  Other Topics Concern   Not on file  Social History Narrative   Not on file   Social Drivers of Health   Financial Resource Strain: Not on file  Food Insecurity: No Food Insecurity (05/27/2024)   Hunger Vital Sign    Worried About Running Out of Food in the Last Year: Never true    Ran Out of Food in the Last Year: Never true  Transportation Needs: No Transportation Needs (05/27/2024)   PRAPARE - Administrator, Civil Service (Medical): No    Lack of Transportation (Non-Medical): No  Physical Activity: Not on file  Stress: Not on file  Social Connections: Moderately Integrated (05/27/2024)   Social Connection and Isolation Panel    Frequency of Communication with Friends and Family: More than three times a week    Frequency of Social Gatherings with Friends and Family: Three times a week    Attends Religious Services: More than 4 times per year    Active Member of Clubs or Organizations: Yes    Attends Banker Meetings: 1 to 4 times per year    Marital Status: Never married    Review of systems General: negative for malaise, night sweats, fever, chills, weight loss Neck: Negative for lumps, goiter, pain and significant neck swelling Resp: Negative for cough, wheezing, dyspnea at rest CV: Negative for chest pain, leg swelling, palpitations, orthopnea GI: denies melena, hematochezia, nausea, vomiting, constipation, dysphagia, odyonophagia, early satiety or unintentional weight loss. +diarrhea +reflux   MSK: Negative for joint pain or swelling, back pain, and muscle pain. Derm: Negative for itching or rash Psych: Denies depression, anxiety, memory loss, confusion. No homicidal or suicidal ideation.  Heme: Negative for prolonged bleeding, bruising easily, and swollen nodes. Endocrine: Negative for cold or heat intolerance, polyuria, polydipsia and goiter. Neuro: negative for tremor, gait imbalance, syncope and seizures. The remainder of the review of systems is noncontributory.  Physical Exam: There were no vitals taken for this visit. General:   Alert and oriented. No distress noted. Pleasant and cooperative.  Head:  Normocephalic and atraumatic. Eyes:  Conjuctiva clear without scleral icterus. Mouth:  Oral mucosa pink and moist. Good dentition. No lesions. Heart: Normal rate and rhythm, s1 and s2 heart sounds present.  Lungs: Clear lung sounds in all lobes. Respirations equal and unlabored. Abdomen:  +BS, soft, non-tender and non-distended. No rebound or guarding. No HSM or masses noted. Derm: No palmar erythema or jaundice Msk:  Symmetrical without gross deformities. Normal posture. Knee braces present bilaterally  Extremities:  Without edema. Neurologic:  Alert and  oriented x4 Psych:  Alert and cooperative. Normal mood and affect.  Invalid input(s): 6 MONTHS   ASSESSMENT: Marie Calhoun is a 73 y.o. female presenting today for follow up of GERD and with new onset diarrhea  GERD; currently on lansoprazole  30mg  daily which she has been maintained on for the past few years, notes more breakthrough recently with symptoms almost daily.  Recommend stopping lansoprazole  and starting omeprazole  40mg  daily, instructed on good reflux precautions  Diarrhea: onset 1 week ago, s/p 2 weeks recent hip arthroplasty on 7/29 with short hospital admission and received ancef  during the course. No rectal bleeding or melena. Has constipation at baseline for which she takes linzess  but had accidentally  continued this through yesterday, therefore, low suspicion this is overflow diarrhea, she reports only taking a short course of opiate pain meds after surgery. Given recent admission/antibiotics, higher suspicion for infectious etiology such as C diff. Will obtain stool studies and check basic labs to ensure no electrolyte imbalance.   PLAN:  -C diff, GI pathogen panel -CBC, CMP -stay well hydrated with water /low sugar electrolyte drinks -BRAT diet while diarrhea persist -hold linzess  for now -stop lansoprazole  -start omeprazole  40mg  daily, good reflux precautions  All questions were answered, patient verbalized understanding and is in agreement with plan as outlined above.   Follow Up: 2 months   Crystalynn Mcinerney L. Clorissa Gruenberg, MSN, APRN, AGNP-C Adult-Gerontology Nurse Practitioner [facility]

## 2024-06-24 NOTE — Patient Instructions (Addendum)
 Stop lansoprazole  and start omeprazole  40mg  daily We will check labs to evaluate electrolytes and stool testing to try and determine cause of diarrhea Continue to stay well hydrated alternating water  and low sugar electrolyte drinks, you can also utilize the Bananas, rice, applesauce, toast (BRAT) diet while diarrhea is going on. I will be in touch once I have labs back with further recommendations  Follow up 2 months  It was a pleasure to see you today. I want to create trusting relationships with patients and provide genuine, compassionate, and quality care. I truly value your feedback! please be on the lookout for a survey regarding your visit with me today. I appreciate your input about our visit and your time in completing this!    Gladyes Kudo L. Camren Lipsett, MSN, APRN, AGNP-C Adult-Gerontology Nurse Practitioner [facility]

## 2024-06-25 LAB — COMPREHENSIVE METABOLIC PANEL WITH GFR
AG Ratio: 1.4 (calc) (ref 1.0–2.5)
ALT: 10 U/L (ref 6–29)
AST: 22 U/L (ref 10–35)
Albumin: 3.6 g/dL (ref 3.6–5.1)
Alkaline phosphatase (APISO): 129 U/L (ref 37–153)
BUN/Creatinine Ratio: 14 (calc) (ref 6–22)
BUN: 18 mg/dL (ref 7–25)
CO2: 27 mmol/L (ref 20–32)
Calcium: 8.6 mg/dL (ref 8.6–10.4)
Chloride: 104 mmol/L (ref 98–110)
Creat: 1.3 mg/dL — ABNORMAL HIGH (ref 0.60–1.00)
Globulin: 2.5 g/dL (ref 1.9–3.7)
Glucose, Bld: 137 mg/dL — ABNORMAL HIGH (ref 65–99)
Potassium: 3 mmol/L — ABNORMAL LOW (ref 3.5–5.3)
Sodium: 141 mmol/L (ref 135–146)
Total Bilirubin: 0.6 mg/dL (ref 0.2–1.2)
Total Protein: 6.1 g/dL (ref 6.1–8.1)
eGFR: 44 mL/min/1.73m2 — ABNORMAL LOW (ref >=60)

## 2024-06-25 LAB — CBC
HCT: 35.6 % (ref 35.0–45.0)
Hemoglobin: 11.9 g/dL (ref 11.7–15.5)
MCH: 32.6 pg (ref 27.0–33.0)
MCHC: 33.4 g/dL (ref 32.0–36.0)
MCV: 97.5 fL (ref 80.0–100.0)
MPV: 10.3 fL (ref 7.5–12.5)
Platelets: 275 Thousand/uL (ref 140–400)
RBC: 3.65 Million/uL — ABNORMAL LOW (ref 3.80–5.10)
RDW: 12.4 % (ref 11.0–15.0)
WBC: 4.3 Thousand/uL (ref 3.8–10.8)

## 2024-06-25 LAB — C. DIFFICILE GDH AND TOXIN A/B
GDH ANTIGEN: DETECTED
MICRO NUMBER:: 16802348
SPECIMEN QUALITY:: ADEQUATE
TOXIN A AND B: DETECTED

## 2024-06-27 LAB — GASTROINTESTINAL PATHOGEN PNL
CampyloBacter Group: NOT DETECTED
Norovirus GI/GII: NOT DETECTED
Rotavirus A: NOT DETECTED
Salmonella species: NOT DETECTED
Shiga Toxin 1: NOT DETECTED
Shiga Toxin 2: NOT DETECTED
Shigella Species: NOT DETECTED
Vibrio Group: NOT DETECTED
Yersinia enterocolitica: NOT DETECTED

## 2024-06-28 ENCOUNTER — Ambulatory Visit (INDEPENDENT_AMBULATORY_CARE_PROVIDER_SITE_OTHER): Payer: Self-pay | Admitting: Gastroenterology

## 2024-06-28 ENCOUNTER — Other Ambulatory Visit (INDEPENDENT_AMBULATORY_CARE_PROVIDER_SITE_OTHER): Payer: Self-pay

## 2024-06-28 ENCOUNTER — Other Ambulatory Visit (INDEPENDENT_AMBULATORY_CARE_PROVIDER_SITE_OTHER): Payer: Self-pay | Admitting: Gastroenterology

## 2024-06-28 DIAGNOSIS — A0472 Enterocolitis due to Clostridium difficile, not specified as recurrent: Secondary | ICD-10-CM

## 2024-06-28 DIAGNOSIS — E876 Hypokalemia: Secondary | ICD-10-CM

## 2024-06-28 MED ORDER — FIDAXOMICIN 200 MG PO TABS: 200.0000 mg | ORAL_TABLET | Freq: Two times a day (BID) | ORAL | 0 refills | Status: DC

## 2024-06-28 MED ORDER — POTASSIUM CHLORIDE CRYS ER 20 MEQ PO TBCR: 20.0000 meq | EXTENDED_RELEASE_TABLET | Freq: Two times a day (BID) | ORAL | 0 refills | Status: DC

## 2024-06-28 MED ORDER — POTASSIUM CHLORIDE CRYS ER 20 MEQ PO TBCR
20.0000 meq | EXTENDED_RELEASE_TABLET | Freq: Two times a day (BID) | ORAL | 0 refills | Status: DC
Start: 2024-06-28 — End: 2024-06-28

## 2024-08-26 ENCOUNTER — Ambulatory Visit (INDEPENDENT_AMBULATORY_CARE_PROVIDER_SITE_OTHER): Admitting: Gastroenterology

## 2024-08-26 VITALS — BP 121/73 | HR 63 | Temp 97.2°F | Ht 67.0 in | Wt 162.7 lb

## 2024-08-26 DIAGNOSIS — K2100 Gastro-esophageal reflux disease with esophagitis, without bleeding: Secondary | ICD-10-CM

## 2024-08-26 DIAGNOSIS — K219 Gastro-esophageal reflux disease without esophagitis: Secondary | ICD-10-CM | POA: Diagnosis not present

## 2024-08-26 DIAGNOSIS — Z860100 Personal history of colon polyps, unspecified: Secondary | ICD-10-CM

## 2024-08-26 DIAGNOSIS — K5909 Other constipation: Secondary | ICD-10-CM

## 2024-08-26 DIAGNOSIS — Z8619 Personal history of other infectious and parasitic diseases: Secondary | ICD-10-CM | POA: Diagnosis not present

## 2024-08-26 DIAGNOSIS — K5900 Constipation, unspecified: Secondary | ICD-10-CM | POA: Diagnosis not present

## 2024-08-26 NOTE — Progress Notes (Signed)
 Referring Provider: Diedra Senior, MD Primary Care Physician:  Diedra Senior, MD Primary GI Physician: Dr. Cinderella   Chief Complaint  Patient presents with   Follow-up    Pt feels like she is constipated all the time, even taking Linzess .    HPI:   Marie Calhoun is a 73 y.o. female with past medical history of anxiety, arthritis, asthma, skin cancer, CMT disease, depression, HTN, GERD, high cholesterol, lupus.   Patient presenting today for:  Follow up of GERD, chronic constipation, C diff   Last seen August, at that time had recent hip surgery, diarrhea began on 8/5. Having 5 stools per day, watery to loose. Reported constipation prior to onset of diarrhea. Taking linzess . Having more reflux symptoms on lansoprazole    Recommended C diff, GI pathogen panel, CBC, CMP, BRAT diet, hold linzess , stop lansoprazole , start omeprazole   Labs done 8/7 with positive C diff toxin  Negative GI pathogen panel Sent potassium due to hypokalemia (k+ 3)and dificid  x10 days  Recent labs done 08/04/24 (patient brings copy with her today for review) with grossly normal CBC, normal LFTs, sodium 142, potassium 3.9  Present:  States that diarrhea cleared up with dificid . She now is back to feeling constipated, despite taking linzess , going multiple days without a BM. She takes stool softener usually every other day but not sure if this is helping. She has tried miralax  which did not help. Some abdominal discomfort at times. No nausea or vomiting. Appetite is improving. She had lost some weight but reports she was previously scared to eat due to diarrhea.   Feels GERD is well controlled on omeprazole  40mg  daily. Feeling much better since change in therapy.    Last Colonoscopy:2022   - Two 4 to 7 mm polyps in the rectum and in the                            sigmoid colon, removed with a cold snare. Resected                            and retrieved. (Hyperplastic polyp)   Last Endoscopy:2019- Normal  proximal esophagus and mid esophagus.                           - LA Grade B reflux esophagitis.                           - Benign-appearing esophageal stenosis. Dilated.                           - 3 cm hiatal hernia.                           - Normal stomach.                           - Normal duodenal bulb and second portion of the                            duodenum.                           - No  specimens collected.   Recommendations:  Repeat colonoscopy in 10 years    Filed Weights   08/26/24 0930  Weight: 162 lb 11.2 oz (73.8 kg)     Past Medical History:  Diagnosis Date   Anxiety    Arthritis    Asthma 05/28/2017   Cancer (HCC)    skin cancer   Charcot-Marie-Tooth disease    Depression    Dyspnea    with exertion   Environmental allergies 05/28/2017   Esophageal stricture 05/28/2017   Essential hypertension, benign 05/28/2017   GERD (gastroesophageal reflux disease) 05/28/2017   Hypercholesteremia    Lupus    of skin   PONV (postoperative nausea and vomiting)    Seasonal allergies    Sjogren's syndrome     Past Surgical History:  Procedure Laterality Date   breast tumor\     left benign   CHOLECYSTECTOMY     COLONOSCOPY WITH PROPOFOL  N/A 05/30/2021   Procedure: COLONOSCOPY WITH PROPOFOL ;  Surgeon: Golda Claudis PENNER, MD;  Location: [facility];  Service: Endoscopy;  Laterality: N/A;  855   ESOPHAGEAL DILATION N/A 09/11/2017   Procedure: ESOPHAGEAL DILATION;  Surgeon: Golda Claudis PENNER, MD;  Location: [facility];  Service: Endoscopy;  Laterality: N/A;   ESOPHAGEAL DILATION N/A 06/17/2018   Procedure: ESOPHAGEAL DILATION;  Surgeon: Golda Claudis PENNER, MD;  Location: [facility];  Service: Endoscopy;  Laterality: N/A;   ESOPHAGOGASTRODUODENOSCOPY N/A 09/11/2017   Procedure: ESOPHAGOGASTRODUODENOSCOPY (EGD);  Surgeon: Golda Claudis PENNER, MD;  Location: [facility];  Service: Endoscopy;  Laterality: N/A;  1:40 - Can NOT come earlier    ESOPHAGOGASTRODUODENOSCOPY N/A 06/17/2018   Procedure: ESOPHAGOGASTRODUODENOSCOPY (EGD);  Surgeon: Golda Claudis PENNER, MD;  Location: [facility];  Service: Endoscopy;  Laterality: N/A;  10:30   hand tendon release Left    KNEE ARTHROPLASTY Right 07/17/2022   Procedure: COMPUTER ASSISTED TOTAL KNEE ARTHROPLASTY;  Surgeon: Fidel Rogue, MD;  Location: [facility];  Service: Orthopedics;  Laterality: Right;  150   KNEE ARTHROPLASTY Left 06/25/2023   Procedure: COMPUTER ASSISTED TOTAL KNEE ARTHROPLASTY;  Surgeon: Fidel Rogue, MD;  Location: [facility];  Service: Orthopedics;  Laterality: Left;   knee surgeries     1980   LUMBAR EPIDURAL INJECTION     PARTIAL HYSTERECTOMY     1995   POLYPECTOMY  05/30/2021   Procedure: POLYPECTOMY;  Surgeon: Golda Claudis PENNER, MD;  Location: [facility];  Service: Endoscopy;;   TOTAL HIP ARTHROPLASTY Right 05/30/2022   Procedure: TOTAL HIP ARTHROPLASTY ANTERIOR APPROACH;  Surgeon: Fidel Rogue, MD;  Location: [facility];  Service: Orthopedics;  Laterality: Right;   TOTAL HIP ARTHROPLASTY Left 05/27/2024   Procedure: ARTHROPLASTY, HIP, TOTAL, ANTERIOR APPROACH;  Surgeon: Fidel Rogue, MD;  Location: [facility];  Service: Orthopedics;  Laterality: Left;    Current Outpatient Medications  Medication Sig Dispense Refill   acetaminophen  (TYLENOL ) 500 MG tablet Take 2 tablets (1,000 mg total) by mouth every 8 (eight) hours as needed for mild pain, moderate pain or fever. 30 tablet 0   acyclovir  (ZOVIRAX ) 400 MG tablet Take 400 mg by mouth 3 (three) times daily as needed (for cold sores/fever blisters (takes for 7 days when needed)).     albuterol  (VENTOLIN  HFA) 108 (90 Base) MCG/ACT inhaler INHALE 2 PUFFS INTO THE LUNGS EVERY 6 (SIX) HOURS AS NEEDED FOR WHEEZING OR SHORTNESS OF BREATH. 1 each 2   amoxicillin (AMOXIL) 500 MG capsule Take 2,000 mg by mouth See admin instructions. Take  1 hour prior to dental work     Calcium  Carb-Cholecalciferol  (CALCIUM  600 + D PO) Take 1  tablet by mouth 2 (two) times daily.     Cyanocobalamin  (B-12) 2500 MCG TABS Take 2,500 mcg by mouth daily.     cycloSPORINE  (RESTASIS ) 0.05 % ophthalmic emulsion Place 1 drop into both eyes 2 (two) times daily.     fidaxomicin  (DIFICID ) 200 MG TABS tablet Take 1 tablet (200 mg total) by mouth 2 (two) times daily. 20 tablet 0   fluticasone  (FLONASE ) 50 MCG/ACT nasal spray USE 1 TO 2 SPRAYS IN EACH NOSTRIL EVERY DAY AS NEEDED FOR ALLERGIES. (Patient taking differently: 2 sprays as needed for allergies or rhinitis.) 16 g 10   folic acid  (FOLVITE ) 1 MG tablet Take 1 mg by mouth daily.     gabapentin  (NEURONTIN ) 300 MG capsule Take 300-600 mg by mouth See admin instructions. 300  mg twice daily, 600 mg at bedtime     ketoconazole (NIZORAL) 2 % cream Apply 1 Application topically daily as needed (fungus). Apply to nail and finger around affected area     LINZESS  145 MCG CAPS capsule TAKE 1 CAPSULE EVERY DAY 90 capsule 3   losartan -hydrochlorothiazide  (HYZAAR) 50-12.5 MG tablet Take 0.5 tablets by mouth at bedtime.     methotrexate (RHEUMATREX) 2.5 MG tablet TAKE 4 TABLETS BY MOUTH EVERY WEDNESDAY     metoprolol  succinate (TOPROL -XL) 25 MG 24 hr tablet Take 25 mg by mouth 2 (two) times daily.     mineral oil light external liquid Apply 1 Application topically daily as needed (In ears of needed).     Multiple Vitamin (MULTIVITAMIN WITH MINERALS) TABS tablet Take 1 tablet by mouth in the morning. Woman's     MYRBETRIQ  25 MG TB24 tablet TAKE 1 TABLET EVERY DAY 90 tablet 3   omeprazole  (PRILOSEC) 40 MG capsule Take 1 capsule (40 mg total) by mouth daily. 90 capsule 3   ondansetron  (ZOFRAN ) 4 MG tablet Take 1 tablet (4 mg total) by mouth every 8 (eight) hours as needed for nausea or vomiting. 30 tablet 0   polyethylene glycol (MIRALAX  / GLYCOLAX ) 17 g packet Take 17 g by mouth daily as needed for mild constipation. 14 each 0   predniSONE (DELTASONE) 5 MG tablet Take 5 mg by mouth daily.     silver   sulfADIAZINE  (SILVADENE ) 1 % cream Apply 1 application topically daily as needed (foot wounds/sores).     triamcinolone  cream (KENALOG ) 0.1 % Apply 1 Application topically daily as needed (rash).     No current facility-administered medications for this visit.    Allergies as of 08/26/2024 - Review Complete 08/26/2024  Allergen Reaction Noted   Doxycycline Itching and Rash 10/30/2018   Demerol  [meperidine ] Nausea Only 05/28/2017   Pantoprazole  Other (See Comments) 06/15/2018   Plaquenil [hydroxychloroquine]  06/15/2018   Sulfa antibiotics Swelling 05/28/2017   Sulfamethoxazole-trimethoprim Other (See Comments) 03/12/2022   Ranitidine Rash 10/30/2018    Social History   Socioeconomic History   Marital status: Single    Spouse name: Not on file   Number of children: Not on file   Years of education: Not on file   Highest education level: Not on file  Occupational History   Not on file  Tobacco Use   Smoking status: Never    Passive exposure: Never   Smokeless tobacco: Never  Vaping Use   Vaping status: Never Used  Substance and Sexual Activity   Alcohol  use: No   Drug  use: No   Sexual activity: Not Currently  Other Topics Concern   Not on file  Social History Narrative   Not on file   Social Drivers of Health   Financial Resource Strain: Not on file  Food Insecurity: No Food Insecurity (05/27/2024)   Hunger Vital Sign    Worried About Running Out of Food in the Last Year: Never true    Ran Out of Food in the Last Year: Never true  Transportation Needs: No Transportation Needs (05/27/2024)   PRAPARE - Administrator, Civil Service (Medical): No    Lack of Transportation (Non-Medical): No  Physical Activity: Not on file  Stress: Not on file  Social Connections: Moderately Integrated (05/27/2024)   Social Connection and Isolation Panel    Frequency of Communication with Friends and Family: More than three times a week    Frequency of Social Gatherings with  Friends and Family: Three times a week    Attends Religious Services: More than 4 times per year    Active Member of Clubs or Organizations: Yes    Attends Banker Meetings: 1 to 4 times per year    Marital Status: Never married    Review of systems General: negative for malaise, night sweats, fever, chills, weight loss Neck: Negative for lumps, goiter, pain and significant neck swelling Resp: Negative for cough, wheezing, dyspnea at rest CV: Negative for chest pain, leg swelling, palpitations, orthopnea GI: denies melena, hematochezia, nausea, vomiting, diarrhea, dysphagia, odyonophagia, early satiety or unintentional weight loss. +constipation  MSK: Negative for joint pain or swelling, back pain, and muscle pain. Derm: Negative for itching or rash Psych: Denies depression, anxiety, memory loss, confusion. No homicidal or suicidal ideation.  Heme: Negative for prolonged bleeding, bruising easily, and swollen nodes. Endocrine: Negative for cold or heat intolerance, polyuria, polydipsia and goiter. Neuro: negative for tremor, gait imbalance, syncope and seizures. The remainder of the review of systems is noncontributory.  Physical Exam: BP 121/73 (BP Location: Left Arm, Patient Position: Sitting, Cuff Size: Normal)   Pulse 63   Temp (!) 97.2 F (36.2 C) (Temporal)   Ht 5' 7 (1.702 m)   Wt 162 lb 11.2 oz (73.8 kg)   BMI 25.48 kg/m  General:   Alert and oriented. No distress noted. Pleasant and cooperative.  Head:  Normocephalic and atraumatic. Eyes:  Conjuctiva clear without scleral icterus. Mouth:  Oral mucosa pink and moist. Good dentition. No lesions. Heart: Normal rate and rhythm, s1 and s2 heart sounds present.  Lungs: Clear lung sounds in all lobes. Respirations equal and unlabored. Abdomen:  +BS, soft, non-tender and non-distended. No rebound or guarding. No HSM or masses noted. Derm: No palmar erythema or jaundice Msk:  Symmetrical without gross deformities.  Normal posture. Extremities:  Without edema. Neurologic:  Alert and  oriented x4 Psych:  Alert and cooperative. Normal mood and affect.  Invalid input(s): 6 MONTHS   ASSESSMENT: Marie Calhoun is a 73 y.o. female presenting today for follow up of constipation, GERD and recent C diff  Recent stool testing positive for c diff after she presented with diarrhea, s/p hip replacement surgery where she received antibiotics. Diarrhea resolved with course of dificid   She is back to having chronic diarrhea, taking linzess  145mcg daily without much results, taking stool softener as needed. Miralax  has not provided results for her in the past. At this time, recommend increasing linzess  to 290mcg daily for the next 10-14 days she can double up and  take 2 of her 145mcg dose, she should let me know how this is working. If no improvement, will consider switching to amitiza as it appears this is covered under her insurance plan.   GERD well controlled with change in therapy to omeprazole  40mg  daily from lansoprazole . She has no breakthrough, will continue with current regimen.   PLAN:  -increase linzess  to daily x10-14 days, will send Rx for higher dose if this works -if no improvement, consider change in therapy from linzess  to [place] -Increase water  intake, aim for atleast 64 oz per day -Increase fruits, veggies and whole grains, kiwi and prunes are especially good for constipation -continue omeprazole  40mg  daily   All questions were answered, patient verbalized understanding and is in agreement with plan as outlined above.    Follow Up: 3 months   Exzavier Ruderman L. Evalene Vath, MSN, APRN, AGNP-C Adult-Gerontology Nurse Practitioner [facility]

## 2024-08-26 NOTE — Patient Instructions (Signed)
 Please double up and take two of your linzess  145mcg daily, in the morning for the next 10-14 days, let me know how you are doing, if no improvement we will consider switching to a different med  Increase water  intake, aim for atleast 64 oz per day Increase fruits, veggies and whole grains, kiwi and prunes are especially good for constipation  Continue omeprazole  40mg  daily  Follow up 3 months  It was a pleasure to see you today. I want to create trusting relationships with patients and provide genuine, compassionate, and quality care. I truly value your feedback! please be on the lookout for a survey regarding your visit with me today. I appreciate your input about our visit and your time in completing this!    Markeia Harkless L. Jerri Hargadon, MSN, APRN, AGNP-C Adult-Gerontology Nurse Practitioner [facility]

## 2024-09-01 ENCOUNTER — Encounter (INDEPENDENT_AMBULATORY_CARE_PROVIDER_SITE_OTHER): Payer: Self-pay | Admitting: Gastroenterology

## 2024-09-13 ENCOUNTER — Encounter (INDEPENDENT_AMBULATORY_CARE_PROVIDER_SITE_OTHER): Payer: Self-pay

## 2024-09-20 ENCOUNTER — Other Ambulatory Visit (HOSPITAL_COMMUNITY)

## 2024-10-12 ENCOUNTER — Other Ambulatory Visit (INDEPENDENT_AMBULATORY_CARE_PROVIDER_SITE_OTHER): Payer: Self-pay | Admitting: Gastroenterology

## 2024-10-12 MED ORDER — LINACLOTIDE 290 MCG PO CAPS: 290.0000 ug | ORAL_CAPSULE | Freq: Every day | ORAL | 3 refills | Status: DC

## 2024-11-29 ENCOUNTER — Encounter (INDEPENDENT_AMBULATORY_CARE_PROVIDER_SITE_OTHER): Payer: Self-pay | Admitting: Gastroenterology

## 2024-11-29 ENCOUNTER — Ambulatory Visit (INDEPENDENT_AMBULATORY_CARE_PROVIDER_SITE_OTHER): Admitting: Gastroenterology

## 2024-11-29 VITALS — BP 136/78 | HR 60 | Temp 97.0°F | Ht 68.0 in | Wt 166.4 lb

## 2024-11-29 DIAGNOSIS — K5900 Constipation, unspecified: Secondary | ICD-10-CM | POA: Diagnosis not present

## 2024-11-29 DIAGNOSIS — K5909 Other constipation: Secondary | ICD-10-CM

## 2024-11-29 NOTE — Progress Notes (Signed)
 "  Referring Provider: Diedra Senior, MD Primary Care Physician:  Diedra Senior, MD Primary GI Physician: Dr. Cinderella  Chief Complaint  Patient presents with   Follow-up    Pt arrives for follow up. Believes she had Norovirus on Christmas. Dehydrated on Christmas Day-unable to get out of bed until that Sunday. Since then has had issues with constipation. Pt states her medication (Linzess )was working well until she became sick.     HPI:   Marie Calhoun is a 74 y.o. female with past medical history of  anxiety, arthritis, asthma, skin cancer, CMT disease, depression, HTN, GERD, high cholesterol, lupus.   Patient presenting today for:  Constipation  Last seen October, at that time feeling constipated, despite taking linzess , takes stool softener every other day, miralax  did not help, some abdominal discomfort at times. GERD well controlled on omeprazole  40mg  daily   Recommended to increase linzess  to 290mcg daily, consider change to amitiza if no improvement, continue omeprazole  40mg  daily  Most recent labs with hgb 13.1 wbc 7.3 creat 1.14 sodium 140 potassium 3.7 LFTs WNL   Present:  States she was sick around [place], thinks it was likely  norovirus. She was vomiting a lot and was in bed for almost 3 days. She had some diarrhea, took a small amount of imodium as she was vomiting at the same time. Since being sick she has felt very constipated. She is taking her linzess  290mcg daily now, she did miss a few doses of her linzess  while she was sick. States she is passing small amounts of stool. She has taken a stool softener for 2 days which helped some. She notes passing a small amount of stool each time she urinates. She has had one good BM on Sunday but no other good BMs since then. Prior to acute illness she felt linzess  was working well though only having a BM maybe every 3 days.     06/24/24 with positive C diff toxin  Negative GI pathogen panel given dificid  x10 day  Last  Colonoscopy:2022   - Two 4 to 7 mm polyps in the rectum and in the                            sigmoid colon, removed with a cold snare. Resected                            and retrieved. (Hyperplastic polyp)   Last Endoscopy:2019- Normal proximal esophagus and mid esophagus.                           - LA Grade B reflux esophagitis.                           - Benign-appearing esophageal stenosis. Dilated.                           - 3 cm hiatal hernia.                           - Normal stomach.                           - Normal duodenal bulb and second portion of the  duodenum.                           - No specimens collected.   Recommendations:  Repeat colonoscopy in 10 years   Past Medical History:  Diagnosis Date   Anxiety    Arthritis    Asthma 05/28/2017   Cancer (HCC)    skin cancer   Charcot-Marie-Tooth disease    Depression    Dyspnea    with exertion   Environmental allergies 05/28/2017   Esophageal stricture 05/28/2017   Essential hypertension, benign 05/28/2017   GERD (gastroesophageal reflux disease) 05/28/2017   Hypercholesteremia    Lupus    of skin   PONV (postoperative nausea and vomiting)    Seasonal allergies    Sjogren's syndrome     Past Surgical History:  Procedure Laterality Date   breast tumor\     left benign   CHOLECYSTECTOMY     COLONOSCOPY WITH PROPOFOL  N/A 05/30/2021   Procedure: COLONOSCOPY WITH PROPOFOL ;  Surgeon: Golda Claudis PENNER, MD;  Location: [facility];  Service: Endoscopy;  Laterality: N/A;  855   ESOPHAGEAL DILATION N/A 09/11/2017   Procedure: ESOPHAGEAL DILATION;  Surgeon: Golda Claudis PENNER, MD;  Location: [facility];  Service: Endoscopy;  Laterality: N/A;   ESOPHAGEAL DILATION N/A 06/17/2018   Procedure: ESOPHAGEAL DILATION;  Surgeon: Golda Claudis PENNER, MD;  [facility]: [facility];  Service: Endoscopy;  Laterality: N/A;   ESOPHAGOGASTRODUODENOSCOPY N/A 09/11/2017   Procedure:  ESOPHAGOGASTRODUODENOSCOPY (EGD);  Surgeon: Golda Claudis PENNER, MD;  [facility]: [facility];  Service: Endoscopy;  Laterality: N/A;  1:40 - Can NOT come earlier   ESOPHAGOGASTRODUODENOSCOPY N/A 06/17/2018   Procedure: ESOPHAGOGASTRODUODENOSCOPY (EGD);  Surgeon: Golda Claudis PENNER, MD;  [facility]: [facility];  Service: Endoscopy;  Laterality: N/A;  10:30   hand tendon release Left    KNEE ARTHROPLASTY Right 07/17/2022   Procedure: COMPUTER ASSISTED TOTAL KNEE ARTHROPLASTY;  Surgeon: Fidel Rogue, MD;  Location: [facility];  Service: Orthopedics;  Laterality: Right;  150   KNEE ARTHROPLASTY Left 06/25/2023   Procedure: COMPUTER ASSISTED TOTAL KNEE ARTHROPLASTY;  Surgeon: Fidel Rogue, MD;  Location: [facility];  Service: Orthopedics;  Laterality: Left;   knee surgeries     1980   LUMBAR EPIDURAL INJECTION     PARTIAL HYSTERECTOMY     1995   POLYPECTOMY  05/30/2021   Procedure: POLYPECTOMY;  Surgeon: Golda Claudis PENNER, MD;  [facility]: [facility];  Service: Endoscopy;;   TOTAL HIP ARTHROPLASTY Right 05/30/2022   Procedure: TOTAL HIP ARTHROPLASTY ANTERIOR APPROACH;  Surgeon: Fidel Rogue, MD;  Location: [facility];  Service: Orthopedics;  Laterality: Right;   TOTAL HIP ARTHROPLASTY Left 05/27/2024   Procedure: ARTHROPLASTY, HIP, TOTAL, ANTERIOR APPROACH;  Surgeon: Fidel Rogue, MD;  Location: [facility];  Service: Orthopedics;  Laterality: Left;    Current Outpatient Medications  Medication Sig Dispense Refill   acetaminophen  (TYLENOL ) 500 MG tablet Take 2 tablets (1,000 mg total) by mouth every 8 (eight) hours as needed for mild pain, moderate pain or fever. 30 tablet 0   acyclovir  (ZOVIRAX ) 400 MG tablet Take 400 mg by mouth 3 (three) times daily as needed (for cold sores/fever blisters (takes for 7 days when needed)).     albuterol  (VENTOLIN  HFA) 108 (90 Base) MCG/ACT inhaler INHALE 2 PUFFS INTO THE LUNGS EVERY 6 (SIX) HOURS AS NEEDED FOR WHEEZING OR SHORTNESS OF BREATH. 1 each 2   amoxicillin (AMOXIL)  500  MG capsule Take 2,000 mg by mouth See admin instructions. Take 1 hour prior to dental work     Ascorbic Acid (VITAMIN C) 1000 MG tablet      cycloSPORINE  (RESTASIS ) 0.05 % ophthalmic emulsion Place 1 drop into both eyes 2 (two) times daily.     fluticasone  (FLONASE ) 50 MCG/ACT nasal spray USE 1 TO 2 SPRAYS IN EACH NOSTRIL EVERY DAY AS NEEDED FOR ALLERGIES. (Patient taking differently: 2 sprays as needed for allergies or rhinitis.) 16 g 10   folic acid  (FOLVITE ) 1 MG tablet Take 1 mg by mouth daily.     gabapentin  (NEURONTIN ) 300 MG capsule Take 300-600 mg by mouth See admin instructions. 300  mg twice daily, 600 mg at bedtime     ketoconazole (NIZORAL) 2 % cream Apply 1 Application topically daily as needed (fungus). Apply to nail and finger around affected area     linaclotide  (LINZESS ) 290 MCG CAPS capsule Take 1 capsule (290 mcg total) by mouth daily before breakfast. 90 capsule 3   losartan -hydrochlorothiazide  (HYZAAR) 50-12.5 MG tablet Take 0.5 tablets by mouth at bedtime.     methotrexate (RHEUMATREX) 2.5 MG tablet TAKE 4 TABLETS BY MOUTH EVERY WEDNESDAY     metoprolol  succinate (TOPROL -XL) 25 MG 24 hr tablet Take 25 mg by mouth 2 (two) times daily.     mineral oil light external liquid Apply 1 Application topically daily as needed (In ears of needed).     MYRBETRIQ  25 MG TB24 tablet TAKE 1 TABLET EVERY DAY 90 tablet 3   omeprazole  (PRILOSEC) 40 MG capsule Take 1 capsule (40 mg total) by mouth daily. 90 capsule 3   ondansetron  (ZOFRAN ) 4 MG tablet Take 1 tablet (4 mg total) by mouth every 8 (eight) hours as needed for nausea or vomiting. 30 tablet 0   predniSONE (DELTASONE) 5 MG tablet Take 5 mg by mouth daily.     silver  sulfADIAZINE  (SILVADENE ) 1 % cream Apply 1 application topically daily as needed (foot wounds/sores).     triamcinolone  cream (KENALOG ) 0.1 % Apply 1 Application topically daily as needed (rash).     Calcium  Carb-Cholecalciferol  (CALCIUM  600 + D PO) Take 1 tablet by  mouth 2 (two) times daily. (Patient not taking: Reported on 11/29/2024)     Cyanocobalamin  (B-12) 2500 MCG TABS Take 2,500 mcg by mouth daily. (Patient not taking: Reported on 11/29/2024)     Multiple Vitamin (MULTIVITAMIN WITH MINERALS) TABS tablet Take 1 tablet by mouth in the morning. Woman's (Patient not taking: Reported on 11/29/2024)     No current facility-administered medications for this visit.    Allergies as of 11/29/2024 - Review Complete 11/29/2024  Allergen Reaction Noted   Doxycycline Itching and Rash 10/30/2018   Demerol  [meperidine ] Nausea Only 05/28/2017   Pantoprazole  Other (See Comments) 06/15/2018   Plaquenil [hydroxychloroquine]  06/15/2018   Sulfa antibiotics Swelling 05/28/2017   Sulfamethoxazole-trimethoprim Other (See Comments) 03/12/2022   Ranitidine Rash 10/30/2018    Social History   Socioeconomic History   Marital status: Single    Spouse name: Not on file   Number of children: Not on file   Years of education: Not on file   Highest education level: Not on file  Occupational History   Not on file  Tobacco Use   Smoking status: Never    Passive exposure: Never   Smokeless tobacco: Never  Vaping Use   Vaping status: Never Used  Substance and Sexual Activity   Alcohol  use: No   Drug  use: No   Sexual activity: Not Currently  Other Topics Concern   Not on file  Social History Narrative   Not on file   Social Drivers of Health   Tobacco Use: Low Risk (11/29/2024)   Patient History    Smoking Tobacco Use: Never    Smokeless Tobacco Use: Never    Passive Exposure: Never  Financial Resource Strain: Not on file  Food Insecurity: No Food Insecurity (05/27/2024)   Epic    Worried About Programme Researcher, Broadcasting/film/video in the Last Year: Never true    Ran Out of Food in the Last Year: Never true  Transportation Needs: No Transportation Needs (05/27/2024)   Epic    Lack of Transportation (Medical): No    Lack of Transportation (Non-Medical): No  Physical  Activity: Not on file  Stress: Not on file  Social Connections: Moderately Integrated (05/27/2024)   Social Connection and Isolation Panel    Frequency of Communication with Friends and Family: More than three times a week    Frequency of Social Gatherings with Friends and Family: Three times a week    Attends Religious Services: More than 4 times per year    Active Member of Clubs or Organizations: Yes    Attends Banker Meetings: 1 to 4 times per year    Marital Status: Never married  Depression (PHQ2-9): Not on file  Alcohol  Screen: Not on file  Housing: Low Risk (05/27/2024)   Epic    Unable to Pay for Housing in the Last Year: No    Number of Times Moved in the Last Year: 0    Homeless in the Last Year: No  Utilities: Not At Risk (05/27/2024)   Epic    Threatened with loss of utilities: No  Health Literacy: Not on file    Review of systems General: negative for malaise, night sweats, fever, chills, weight loss Neck: Negative for lumps, goiter, pain and significant neck swelling Resp: Negative for cough, wheezing, dyspnea at rest CV: Negative for chest pain, leg swelling, palpitations, orthopnea GI: denies melena, hematochezia, nausea, vomiting, diarrhea, dysphagia, odyonophagia, early satiety or unintentional weight loss. +constipation  MSK: Negative for joint pain or swelling, back pain, and muscle pain. Derm: Negative for itching or rash Psych: Denies depression, anxiety, memory loss, confusion. No homicidal or suicidal ideation.  Heme: Negative for prolonged bleeding, bruising easily, and swollen nodes. Endocrine: Negative for cold or heat intolerance, polyuria, polydipsia and goiter. Neuro: negative for tremor, gait imbalance, syncope and seizures. The remainder of the review of systems is noncontributory.  Physical Exam: There were no vitals taken for this visit. General:   Alert and oriented. No distress noted. Pleasant and cooperative.  Head:   Normocephalic and atraumatic. Eyes:  Conjuctiva clear without scleral icterus. Mouth:  Oral mucosa pink and moist. Good dentition. No lesions. Heart: Normal rate and rhythm, s1 and s2 heart sounds present.  Lungs: Clear lung sounds in all lobes. Respirations equal and unlabored. Abdomen:  +BS, soft, non-tender and non-distended. No rebound or guarding. No HSM or masses noted. Derm: No palmar erythema or jaundice Msk:  Symmetrical without gross deformities. Normal posture. Extremities:  Without edema. Neurologic:  Alert and  oriented x4 Psych:  Alert and cooperative. Normal mood and affect.  Invalid input(s): 6 MONTHS   ASSESSMENT: Marie Calhoun is a 74 y.o. female presenting today for follow up of constipation   Patient with recent acute nausea, vomiting and diarrhea, likely norovirus around christmas and noting  worsening constipation thereafter. Taking linzess  290mcg daily and recently added stool softener BID with some improvement. Likely some alterations in bowel habits due to recent acute illness. For now, we will continue with linzess  daily, BID stool softener, if BMs not sufficient over the next 2-3 days, stop stool softener and add miralax  1-2 capfuls daily. Will consider change in therapy to another agent such as amitiza if bowel habits are not normalizing within the next two weeks.    PLAN:  -continue with linzess  290mcg daily -take stool softener BID -if not moving BMs in the next 2-3 days, stop stool softener and add miralax  1-2 capfuls per day -if bowel habits not improving in the next 10-14 days, consider change in therapy to amitiza.  -Increase water  intake, aim for atleast 64 oz per day -Increase fruits, veggies and whole grains, kiwi and prunes are especially good for constipation  All questions were answered, patient verbalized understanding and is in agreement with plan as outlined above.   Follow Up: 3 months   Marie Woehrle L. Kindred Heying, MSN, APRN,  AGNP-C Adult-Gerontology Nurse Practitioner [facility]  "

## 2024-11-29 NOTE — Patient Instructions (Signed)
-  continue with linzess  290mcg daily -take stool softener twice daily -if not moving BMs in the next 2-3 days, stop stool softener and add miralax  1-2 capfuls per day -if bowel habits not improving in the next 10-14 days, consider change in therapy to an alternative to linzess  Increase water  intake, aim for atleast 64 oz per day Increase fruits, veggies and whole grains, kiwi and prunes are especially good for constipation   Follow up 3 months  It was a pleasure to see you today. I want to create trusting relationships with patients and provide genuine, compassionate, and quality care. I truly value your feedback! please be on the lookout for a survey regarding your visit with me today. I appreciate your input about our visit and your time in completing this!    Patria Warzecha L. Arlisa Leclere, MSN, APRN, AGNP-C Adult-Gerontology Nurse Practitioner [facility]

## 2024-12-09 ENCOUNTER — Other Ambulatory Visit (INDEPENDENT_AMBULATORY_CARE_PROVIDER_SITE_OTHER): Payer: Self-pay | Admitting: Gastroenterology

## 2024-12-09 ENCOUNTER — Telehealth (INDEPENDENT_AMBULATORY_CARE_PROVIDER_SITE_OTHER): Payer: Self-pay | Admitting: Gastroenterology

## 2024-12-09 MED ORDER — LUBIPROSTONE 24 MCG PO CAPS: 24.0000 ug | ORAL_CAPSULE | Freq: Two times a day (BID) | ORAL | 2 refills | Status: AC

## 2024-12-09 NOTE — Telephone Encounter (Signed)
 Pt contacted and verbalized understanding.

## 2024-12-09 NOTE — Telephone Encounter (Signed)
 Pt left voicemail stating that the treatment for constipation is not working.  Returned call to patient. Pt states she is taking Linzess  290 mcg, stool softener BID. Did try Miralax  and that did not help. Last bowel movement was one week ago. No pain or rectal bleeding per patient.  Pt last seen 11/29/24. [facility] Please advise. Thank you.

## 2025-03-07 ENCOUNTER — Ambulatory Visit (INDEPENDENT_AMBULATORY_CARE_PROVIDER_SITE_OTHER): Admitting: Gastroenterology

## 2025-05-13 ENCOUNTER — Ambulatory Visit: Admitting: Urology
